# Patient Record
Sex: Female | Born: 1955
Health system: Southern US, Community
[De-identification: ages and names within clinical notes are randomized; demographics above are authoritative.]

## PROBLEM LIST (undated history)

## (undated) DIAGNOSIS — I10 Essential (primary) hypertension: Secondary | ICD-10-CM

## (undated) DIAGNOSIS — F419 Anxiety disorder, unspecified: Secondary | ICD-10-CM

## (undated) DIAGNOSIS — R51 Headache: Secondary | ICD-10-CM

## (undated) DIAGNOSIS — K529 Noninfective gastroenteritis and colitis, unspecified: Secondary | ICD-10-CM

## (undated) DIAGNOSIS — Z860100 Personal history of colon polyps, unspecified: Secondary | ICD-10-CM

## (undated) DIAGNOSIS — K219 Gastro-esophageal reflux disease without esophagitis: Secondary | ICD-10-CM

## (undated) DIAGNOSIS — Z9889 Other specified postprocedural states: Secondary | ICD-10-CM

## (undated) DIAGNOSIS — K5790 Diverticulosis of intestine, part unspecified, without perforation or abscess without bleeding: Secondary | ICD-10-CM

## (undated) DIAGNOSIS — K5190 Ulcerative colitis, unspecified, without complications: Secondary | ICD-10-CM

## (undated) DIAGNOSIS — R112 Nausea with vomiting, unspecified: Secondary | ICD-10-CM

## (undated) DIAGNOSIS — T7840XA Allergy, unspecified, initial encounter: Secondary | ICD-10-CM

## (undated) DIAGNOSIS — K589 Irritable bowel syndrome without diarrhea: Secondary | ICD-10-CM

## (undated) HISTORY — PX: BREAST BIOPSY: SHX20

## (undated) HISTORY — DX: Personal history of colonic polyps: Z86.010

## (undated) HISTORY — DX: Diverticulosis of intestine, part unspecified, without perforation or abscess without bleeding: K57.90

## (undated) HISTORY — DX: Gastro-esophageal reflux disease without esophagitis: K21.9

## (undated) HISTORY — DX: Personal history of colon polyps, unspecified: Z86.0100

## (undated) HISTORY — DX: Ulcerative colitis, unspecified, without complications: K51.90

## (undated) HISTORY — DX: Irritable bowel syndrome, unspecified: K58.9

## (undated) HISTORY — DX: Allergy, unspecified, initial encounter: T78.40XA

## (undated) HISTORY — DX: Essential (primary) hypertension: I10

## (undated) HISTORY — PX: ANKLE SURGERY: SHX546

## (undated) HISTORY — DX: Anxiety disorder, unspecified: F41.9

## (undated) HISTORY — PX: NASAL SINUS SURGERY: SHX719

## (undated) HISTORY — PX: COLONOSCOPY: SHX174

## (undated) HISTORY — PX: BREAST CYST ASPIRATION: SHX578

---

## 1997-05-04 ENCOUNTER — Other Ambulatory Visit: Admission: RE | Admit: 1997-05-04 | Discharge: 1997-05-04 | Payer: Self-pay | Admitting: Obstetrics & Gynecology

## 2000-06-13 ENCOUNTER — Other Ambulatory Visit: Admission: RE | Admit: 2000-06-13 | Discharge: 2000-06-13 | Payer: Self-pay | Admitting: Obstetrics and Gynecology

## 2000-06-14 ENCOUNTER — Ambulatory Visit (HOSPITAL_COMMUNITY): Admission: RE | Admit: 2000-06-14 | Discharge: 2000-06-14 | Payer: Self-pay | Admitting: Obstetrics and Gynecology

## 2000-06-14 ENCOUNTER — Encounter: Payer: Self-pay | Admitting: Obstetrics and Gynecology

## 2001-06-19 ENCOUNTER — Other Ambulatory Visit: Admission: RE | Admit: 2001-06-19 | Discharge: 2001-06-19 | Payer: Self-pay | Admitting: Obstetrics and Gynecology

## 2001-06-23 ENCOUNTER — Encounter: Payer: Self-pay | Admitting: Obstetrics and Gynecology

## 2001-06-23 ENCOUNTER — Ambulatory Visit (HOSPITAL_COMMUNITY): Admission: RE | Admit: 2001-06-23 | Discharge: 2001-06-23 | Payer: Self-pay | Admitting: Obstetrics and Gynecology

## 2001-08-04 ENCOUNTER — Ambulatory Visit (HOSPITAL_COMMUNITY): Admission: RE | Admit: 2001-08-04 | Discharge: 2001-08-04 | Payer: Self-pay | Admitting: Obstetrics and Gynecology

## 2001-08-04 ENCOUNTER — Encounter: Payer: Self-pay | Admitting: Obstetrics and Gynecology

## 2002-09-07 ENCOUNTER — Other Ambulatory Visit: Admission: RE | Admit: 2002-09-07 | Discharge: 2002-09-07 | Payer: Self-pay | Admitting: Obstetrics and Gynecology

## 2003-11-19 ENCOUNTER — Ambulatory Visit: Payer: Self-pay | Admitting: Family Medicine

## 2004-02-25 ENCOUNTER — Other Ambulatory Visit: Admission: RE | Admit: 2004-02-25 | Discharge: 2004-02-25 | Payer: Self-pay | Admitting: Obstetrics and Gynecology

## 2005-01-11 ENCOUNTER — Ambulatory Visit: Payer: Self-pay | Admitting: Family Medicine

## 2005-04-05 ENCOUNTER — Other Ambulatory Visit: Admission: RE | Admit: 2005-04-05 | Discharge: 2005-04-05 | Payer: Self-pay | Admitting: Obstetrics and Gynecology

## 2005-04-20 ENCOUNTER — Encounter: Admission: RE | Admit: 2005-04-20 | Discharge: 2005-04-20 | Payer: Self-pay | Admitting: Obstetrics and Gynecology

## 2006-01-28 ENCOUNTER — Encounter: Admission: RE | Admit: 2006-01-28 | Discharge: 2006-01-28 | Payer: Self-pay | Admitting: Obstetrics and Gynecology

## 2006-08-21 DIAGNOSIS — R519 Headache, unspecified: Secondary | ICD-10-CM | POA: Insufficient documentation

## 2006-08-21 DIAGNOSIS — R51 Headache: Secondary | ICD-10-CM | POA: Insufficient documentation

## 2007-06-11 ENCOUNTER — Encounter: Admission: RE | Admit: 2007-06-11 | Discharge: 2007-06-11 | Payer: Self-pay | Admitting: Specialist

## 2008-09-30 ENCOUNTER — Encounter: Admission: RE | Admit: 2008-09-30 | Discharge: 2008-09-30 | Payer: Self-pay | Admitting: Specialist

## 2010-01-18 ENCOUNTER — Encounter
Admission: RE | Admit: 2010-01-18 | Discharge: 2010-01-18 | Payer: Self-pay | Source: Home / Self Care | Attending: Specialist | Admitting: Specialist

## 2010-01-29 ENCOUNTER — Encounter: Payer: Self-pay | Admitting: Obstetrics and Gynecology

## 2010-01-29 ENCOUNTER — Encounter: Payer: Self-pay | Admitting: Specialist

## 2010-01-30 ENCOUNTER — Encounter: Payer: Self-pay | Admitting: Specialist

## 2010-04-11 ENCOUNTER — Inpatient Hospital Stay (INDEPENDENT_AMBULATORY_CARE_PROVIDER_SITE_OTHER)
Admission: RE | Admit: 2010-04-11 | Discharge: 2010-04-11 | Disposition: A | Discharge status: Home / Self Care | Payer: Federal, State, Local not specified - PPO | Source: Ambulatory Visit | Attending: Emergency Medicine | Admitting: Emergency Medicine

## 2010-04-11 ENCOUNTER — Encounter: Payer: Self-pay | Admitting: Emergency Medicine

## 2010-04-11 DIAGNOSIS — J029 Acute pharyngitis, unspecified: Secondary | ICD-10-CM | POA: Insufficient documentation

## 2010-04-11 DIAGNOSIS — J039 Acute tonsillitis, unspecified: Secondary | ICD-10-CM

## 2010-04-11 LAB — CONVERTED CEMR LAB: Rapid Strep: NEGATIVE

## 2010-04-19 ENCOUNTER — Telehealth (INDEPENDENT_AMBULATORY_CARE_PROVIDER_SITE_OTHER): Payer: Self-pay | Admitting: *Deleted

## 2010-05-07 ENCOUNTER — Encounter: Payer: Self-pay | Admitting: Family Medicine

## 2010-05-07 ENCOUNTER — Inpatient Hospital Stay (INDEPENDENT_AMBULATORY_CARE_PROVIDER_SITE_OTHER)
Admission: RE | Admit: 2010-05-07 | Discharge: 2010-05-07 | Disposition: A | Discharge status: Home / Self Care | Payer: Federal, State, Local not specified - PPO | Source: Ambulatory Visit | Attending: Family Medicine | Admitting: Family Medicine

## 2010-05-07 ENCOUNTER — Ambulatory Visit
Admission: RE | Admit: 2010-05-07 | Discharge: 2010-05-07 | Disposition: A | Discharge status: Home / Self Care | Payer: Federal, State, Local not specified - PPO | Source: Ambulatory Visit | Attending: Family Medicine | Admitting: Family Medicine

## 2010-05-07 ENCOUNTER — Other Ambulatory Visit: Payer: Self-pay | Admitting: Family Medicine

## 2010-05-07 DIAGNOSIS — M25539 Pain in unspecified wrist: Secondary | ICD-10-CM

## 2010-05-07 DIAGNOSIS — S6990XA Unspecified injury of unspecified wrist, hand and finger(s), initial encounter: Secondary | ICD-10-CM

## 2010-05-07 DIAGNOSIS — M79609 Pain in unspecified limb: Secondary | ICD-10-CM

## 2010-05-07 DIAGNOSIS — F325 Major depressive disorder, single episode, in full remission: Secondary | ICD-10-CM | POA: Insufficient documentation

## 2010-05-07 DIAGNOSIS — S59919A Unspecified injury of unspecified forearm, initial encounter: Secondary | ICD-10-CM

## 2010-05-07 DIAGNOSIS — S59909A Unspecified injury of unspecified elbow, initial encounter: Secondary | ICD-10-CM

## 2010-05-07 DIAGNOSIS — S63509A Unspecified sprain of unspecified wrist, initial encounter: Secondary | ICD-10-CM

## 2010-05-14 ENCOUNTER — Telehealth (INDEPENDENT_AMBULATORY_CARE_PROVIDER_SITE_OTHER): Payer: Self-pay

## 2010-07-26 ENCOUNTER — Encounter: Payer: Self-pay | Admitting: Emergency Medicine

## 2010-07-26 ENCOUNTER — Inpatient Hospital Stay (INDEPENDENT_AMBULATORY_CARE_PROVIDER_SITE_OTHER)
Admission: RE | Admit: 2010-07-26 | Discharge: 2010-07-26 | Disposition: A | Discharge status: Home / Self Care | Payer: Federal, State, Local not specified - PPO | Source: Ambulatory Visit | Attending: Emergency Medicine | Admitting: Emergency Medicine

## 2010-07-26 DIAGNOSIS — J029 Acute pharyngitis, unspecified: Secondary | ICD-10-CM

## 2010-07-29 ENCOUNTER — Telehealth (INDEPENDENT_AMBULATORY_CARE_PROVIDER_SITE_OTHER): Payer: Self-pay | Admitting: Emergency Medicine

## 2010-12-11 NOTE — Telephone Encounter (Signed)
  Phone Note Outgoing Call   Call placed by: Mallie Snooks RN,  July 29, 2010 5:06 PM Call placed to: Patient Action Taken: Phone Call Completed Summary of Call: Left message on voice mail stating culture negative; no need to start ABX, but if already started, complete the course. Initial call taken by: Mallie Snooks RN,  July 29, 2010 5:07 PM

## 2010-12-11 NOTE — Telephone Encounter (Signed)
  Phone Note Outgoing Call   Call placed by: Earley Favor RN,  May 14, 2010 2:49 PM Call placed to: Patient Summary of Call: pt states she is doing great, instructed to come by or call facility with questions/concerns

## 2010-12-11 NOTE — Progress Notes (Signed)
Summary: SORE THROAT/TJ rm 5   Vital Signs:  Patient Profile:   55 Years Old Female CC:      sore throat Height:     62 inches Weight:      128.50 pounds O2 Sat:      98 % O2 treatment:    Room Air Temp:     99.5 degrees F oral Pulse rate:   107 / minute Resp:     18 per minute BP sitting:   129 / 89  (left arm) Cuff size:   regular  Vitals Entered By: Charna Archer LPN (April 11, 9935 1:69 PM)                  Updated Prior Medication List: ASPIRIN/ANTACID 325 MG  TBEC (ASPIRIN)  BLACK COHOSH 40 MG  CAPS (BLACK COHOSH)  DAILY HERBS RELAX/EASE TENSION   TABS (MISC NATURAL PRODUCTS)  IMITREX 25 MG  TABS (SUMATRIPTAN SUCCINATE)  VICODIN ES 7.5-750 MG  TABS (HYDROCODONE-ACETAMINOPHEN)  PHENERGAN 25 MG/ML INJ SOLN (PROMETHAZINE HCL)  REGLAN 10 MG  TABS (METOCLOPRAMIDE HCL)  WELLBUTRIN XL 300 MG XR24H-TAB (BUPROPION HCL)   Current Allergies: No known allergies History of Present Illness History from: patient Chief Complaint: sore throat History of Present Illness: This 55 Years Old White Female comes in today complaining of a severe sore throat. The patient denies drooling, trouble swallowing, or trouble breathing. There is not a history of recent exposure to strep. The patient does not give a history of cold or URI symptoms.  Pt complains of  2 days of congestion sore throat. No cough, No dyspnea. No chest pain. No wheezing.  Mild nausea No vomiting. + fever, + chills , + feels achy with mild headache. Last strep tonsillittis was 7 yrs ago. NKDA. She requests rx diflucan if abx rx'd,   REVIEW OF SYSTEMS Constitutional Symptoms      Denies fever, chills, night sweats, weight loss, weight gain, and fatigue.  Eyes       Denies change in vision, eye pain, eye discharge, glasses, contact lenses, and eye surgery. Ear/Nose/Throat/Mouth       Complains of sore throat and hoarseness.      Denies hearing loss/aids, change in hearing, ear pain, ear discharge, dizziness,  frequent runny nose, frequent nose bleeds, sinus problems, and tooth pain or bleeding.  Respiratory       Denies dry cough, productive cough, wheezing, shortness of breath, asthma, bronchitis, and emphysema/COPD.  Cardiovascular       Denies murmurs, chest pain, and tires easily with exhertion.    Gastrointestinal       Denies stomach pain, nausea/vomiting, diarrhea, constipation, blood in bowel movements, and indigestion. Genitourniary       Denies painful urination, kidney stones, and loss of urinary control. Neurological       Complains of headaches.      Denies paralysis, seizures, and fainting/blackouts. Musculoskeletal       Denies muscle pain, joint pain, joint stiffness, decreased range of motion, redness, swelling, muscle weakness, and gout.  Skin       Denies bruising, unusual mles/lumps or sores, and hair/skin or nail changes.  Psych       Denies mood changes, temper/anger issues, anxiety/stress, speech problems, depression, and sleep problems. Other Comments: pt c/o fever, sore throat, HA, achy all over, jaw pain, and sinus congestion x 2 days. she took 4 IBF @ 4:45pm today and 1/2 of a vicodin @ 6:30pm.   Past History:  Past Medical History: Reviewed history from 08/21/2006 and no changes required. Disc Problems Tobacco Abuse Sinusitis Headache  Past Surgical History: Reviewed history from 08/21/2006 and no changes required. Caesarean section Ankle Surgery Lapsx3 - Cysts ovaries Fibroids x2  Family History: Reviewed history from 08/21/2006 and no changes required. Family History Diabetes 1st degree relative  Social History: Occupation: Married Current Smoker- quit Alcohol use-yes Physical Exam General appearance: well developed, well nourished,very fatigued, no acute distress Head: normocephalic, atraumatic Eyes: conjunctivae and lids normal. No icterus Ears: normal, no lesions or deformities Nasal: mucosa pink, nonedematous, no septal deviation,  turbinates normal Oral/Pharynx: bilateral tonsillar erythema and enlargement, with whitish-yellow exudate, Left>Right. uvula midline without deviation. No fluctuence. Neck: supple,tender anterior lymphadenopathy present Chest/Lungs: no rales, wheezes, or rhonchi bilateral, breath sounds equal without effort Heart: regular rate and  rhythm, no murmur Abdomen: soft, non-tender without obvious organomegaly Extremities: no cce Skin: no obvious rashes or lesions. Skin warm and moist. Assessment New Problems: TONSILLITIS, EXUDATIVE, ACUTE (ICD-463) SORE THROAT (ICD-462)  Discussed tx options. Risks, benefits, alternatives discussed. Pt voiced understanding and agreement. -Rocephin 500 mg IM stat. Then, start by mouth rx for augmentin. We are tx'ing aggressively to help avoid a peritonsillar abcess.--Also, send off throat cx. Pt agrees.  Patient Education: Patient and/or caregiver instructed in the following: rest, fluids, Tylenol prn.  Plan New Medications/Changes: FLUCONAZOLE 200 MG TABS (FLUCONAZOLE) 1 by mouth stat as needed yeast infxn  #1 x 0, 04/11/2010, Jacqulyn Cane MD AUGMENTIN [phone] MG TABS (AMOXICILLIN-POT CLAVULANATE) 1 by mouth two times a day pc for 10 days  #20 x 0, 04/11/2010, Jacqulyn Cane MD  New Orders: Rapid Strep [65784] T-Culture, Throat [[id number]] Rocephin  224m [J0696] Rocephin  2574m[J0696] New Patient Level III [99203] Services provided After hours-Weekends-Holidays [99051] Admin of Therapeutic Inj  intramuscular or subcutaneous [96372] Follow Up: Follow up in 2-3 days if no improvement Follow Up: sooner prn  The patient and/or caregiver has been counseled thoroughly with regard to medications prescribed including dosage, schedule, interactions, rationale for use, and possible side effects and they verbalize understanding.  Diagnoses and expected course of recovery discussed and will return if not improved as expected or if the condition worsens. Patient  and/or caregiver verbalized understanding.  Prescriptions: FLUCONAZOLE 200 MG TABS (FLUCONAZOLE) 1 by mouth stat as needed yeast infxn  #1 x 0   Entered and Authorized by:   DaJacqulyn CaneD   Signed by:   DaJacqulyn CaneD on 04/11/2010   Method used:   Handwritten   RxID: :   [id number] 875-125 MG TABS (AMOXICILLIN-POT CLAVULANATE) 1 by mouth two times a day pc for 10 days  #20 x 0   Entered and Authorized by:   DaJacqulyn CaneD   Signed by:   DaJacqulyn CaneD on 04/11/2010   Method used:   Handwritten   RxID: :   [id number] Medication Administration  Injection # 1:    Medication: Rocephin  25029m  Diagnosis: TONSILLITIS, EXUDATIVE, ACUTE (ICD-463)    Route: IM    Site: LUOQ gluteus    Exp Date: 03/09/2011    Lot #: [id number] Mfr: sandoz    Comments: 500 mg given    Patient tolerated injection without complications    Given by: ChrCharna ArcherN (April  3, [id number])  Orders Added: 1)  Rapid Strep [87[63016]  T-Culture, Throat [87[[id number]]  Rocephin  250m20m0696] 4)  Rocephin  250mg48m69[D2202]  5)  New Patient Level III [99203] 6)  Services provided After hours-Weekends-Holidays [99051] 7)  Admin of Therapeutic Inj  intramuscular or subcutaneous [96372]    Laboratory Results  Date/Time Received: April 11, 2010 7:31 PM  Date/Time Reported: April 11, 2010 7:31 PM   Other Tests  Rapid Strep: negative  Kit Test Internal QC: Negative   (Normal Range: Negative)

## 2010-12-11 NOTE — Progress Notes (Signed)
Summary: finger injury/TM (rm 5)   Vital Signs:  Patient Profile:   55 Years Old Female CC:      left hand pain post fall two days ago Height:     62 inches Weight:      128 pounds O2 Sat:      100 % O2 treatment:    Room Air Temp:     98.9 degrees F oral Pulse rate:   95 / minute Resp:     14 per minute BP sitting:   128 / 83  (left arm) Cuff size:   regular  Pt. in pain?   yes    Location:   left hand    Intensity:   6    Type:       sharp  Vitals Entered By: Betti Cruz RN (May 07, 2010 12:09 PM)                   Updated Prior Medication List: ASPIRIN/ANTACID 325 MG  TBEC (ASPIRIN)  BLACK COHOSH 40 MG  CAPS (BLACK COHOSH)  DAILY HERBS RELAX/EASE TENSION   TABS (MISC NATURAL PRODUCTS)  IMITREX 25 MG  TABS (SUMATRIPTAN SUCCINATE)  VICODIN ES 7.5-750 MG  TABS (HYDROCODONE-ACETAMINOPHEN)  PHENERGAN 25 MG/ML INJ SOLN (PROMETHAZINE HCL)  REGLAN 10 MG  TABS (METOCLOPRAMIDE HCL)  WELLBUTRIN XL 300 MG XR24H-TAB (BUPROPION HCL)   Current Allergies: No known allergies History of Present Illness Chief Complaint: left hand pain post fall two days ago History of Present Illness:  Subjective:  Patient fell two days ago, landing on her right hand, hyperextending the wrist.  She felt a popping sensation and has had persistent pain in hand, wrist, and forearm.  Initial swelling decreased with ice and ibuprofen  REVIEW OF SYSTEMS Constitutional Symptoms      Denies fever, chills, night sweats, weight loss, weight gain, and fatigue.  Eyes       Denies change in vision, eye pain, eye discharge, glasses, contact lenses, and eye surgery. Ear/Nose/Throat/Mouth       Denies hearing loss/aids, change in hearing, ear pain, ear discharge, dizziness, frequent runny nose, frequent nose bleeds, sinus problems, sore throat, hoarseness, and tooth pain or bleeding.  Respiratory       Denies dry cough, productive cough, wheezing, shortness of breath, asthma, bronchitis, and  emphysema/COPD.  Cardiovascular       Denies murmurs, chest pain, and tires easily with exhertion.    Gastrointestinal       Denies stomach pain, nausea/vomiting, diarrhea, constipation, blood in bowel movements, and indigestion. Genitourniary       Denies painful urination, kidney stones, and loss of urinary control. Neurological       Denies paralysis, seizures, and fainting/blackouts. Musculoskeletal       Complains of joint pain, joint stiffness, decreased range of motion, and swelling.      Denies muscle pain, redness, muscle weakness, and gout.      Comments: left hand Skin       Denies bruising, unusual mles/lumps or sores, and hair/skin or nail changes.  Psych       Denies mood changes, temper/anger issues, anxiety/stress, speech problems, depression, and sleep problems. Other Comments: Patient fell 2 days ago catching herself with her right hand. She has used ice and ibuprofen.    Past History:  Past Medical History: Disc Problems Sinusitis Migraines Depression  Past Surgical History: Reviewed history from 08/21/2006 and no changes required. Caesarean section Ankle Surgery Lapsx3 - Cysts ovaries Fibroids  x2  Family History: Reviewed history from 08/21/2006 and no changes required. Family History Diabetes 1st degree relative  Social History: Married Current Smoker Alcohol use-yes Regular exercise-yes Does Patient Exercise:  yes   Objective:  Appearance:  Patient appears healthy, stated age, and in no acute distress  Right elbow:  Full range of motion without tenderness Right forearm:  No swelling or deformity.  Mild diffuse tenderness dorsally and laterally Right wrist:  No swelling or deformity.  Decreased range of motion.  Tenderness dorsum.   Right hand:  No swelling or deformity.  Decreased range of motion fingers.  Diffuse tenderness dorsum.  Distal neurovascular intact  Right forearm X-ray:  negative  Right wrist X-ray:  negative Right hand X-ray:   negative Assessment New Problems: WRIST SPRAIN, RIGHT (ICD-842.00) PAIN IN JOINT, FOREARM (ICD-719.43) WRIST INJURY, RIGHT (ICD-959.3) HAND PAIN, RIGHT (ICD-729.5) DEPRESSION (ICD-311)  SUSPECT STRAINS IN FOREARM AND HAND AS WELL AS WRIST  Plan New Medications/Changes: LORTAB 5 5-500 MG TABS (HYDROCODONE-ACETAMINOPHEN) One or two tabs by mouth hs as needed pain  #10 (ten) x 0, 05/07/2010, Theone Murdoch MD NAPROXEN 500 MG TABS (NAPROXEN) One by mouth two times a day pc  #20 x 1, 05/07/2010, Theone Murdoch MD  New Orders: T-DG Forearm*R* [48250] T-DG Wrist Complete*R* [73110] T-DG Hand Complete*R* [73130] Wrist/Thumb Spica [L3923] Services provided After hours-Weekends-Holidays [99051] Est. Patient Level IV [03704] Planning Comments:   Applied thumb spica velcro splint; wear for 5 to 7 days.  Continue ice packs.  Begin Naproxen.  Rx for Lortab at bedtime. Begin stretching and range of motion exercises in 4 to 5 days ([facility] information and instruction patient handout given)  Followup with [facility] if not improved in two weeks.   The patient and/or caregiver has been counseled thoroughly with regard to medications prescribed including dosage, schedule, interactions, rationale for use, and possible side effects and they verbalize understanding.  Diagnoses and expected course of recovery discussed and will return if not improved as expected or if the condition worsens. Patient and/or caregiver verbalized understanding.  Prescriptions: LORTAB 5 5-500 MG TABS (HYDROCODONE-ACETAMINOPHEN) One or two tabs by mouth hs as needed pain  #10 (ten) x 0   Entered and Authorized by:   Theone Murdoch MD   Signed by:   Theone Murdoch MD on 05/07/2010   Method used:   Print then Give to Patient   RxID:   [id number] NAPROXEN 500 MG TABS (NAPROXEN) One by mouth two times a day pc  #20 x 1   Entered and Authorized by:   Theone Murdoch MD   Signed by:   Theone Murdoch MD on  05/07/2010   Method used:   Print then Give to Patient   RxID:   [id number]   Orders Added: 1)  T-DG Forearm*R* [73090] 2)  T-DG Wrist Complete*R* [73110] 3)  T-DG Hand Complete*R* [48016] 4)  Wrist/Thumb Spica [P5374] 5)  Services provided After hours-Weekends-Holidays [99051] 6)  Est. Patient Level IV [82707]

## 2010-12-11 NOTE — Progress Notes (Signed)
Summary: SORE THROAT AND LEFT EAR PAIN   Vital Signs:  Patient Profile:   55 Years Old Female CC:      sore throat and left ear pain Height:     62 inches Weight:      130 pounds O2 Sat:      99 % O2 treatment:    Room Air Temp:     98.6 degrees F oral Pulse rate:   87 / minute Resp:     14 per minute BP sitting:   138 / 82  (left arm) Cuff size:   regular  Vitals Entered By: Betti Cruz RN (July 26, 2010 12:02 PM)                  Updated Prior Medication List: ASPIRIN/ANTACID 325 MG  TBEC (ASPIRIN)  BLACK COHOSH 40 MG  CAPS (BLACK COHOSH)  DAILY HERBS RELAX/EASE TENSION   TABS (MISC NATURAL PRODUCTS)  IMITREX 25 MG  TABS (SUMATRIPTAN SUCCINATE)  REGLAN 10 MG  TABS (METOCLOPRAMIDE HCL)  WELLBUTRIN XL 300 MG XR24H-TAB (BUPROPION HCL)   Current Allergies: No known allergies History of Present Illness History from: patient Chief Complaint: sore throat and left ear pain History of Present Illness: 55 Years Old Female complains of onset of cold symptoms for 2 days.  Mia has been using Allegra which is helping a little bit.  She is a strep carrier. +sore throat No cough No pleuritic pain No wheezing No nasal congestion No post-nasal drainage No sinus pain/pressure No chest congestion No itchy/red eyes + L earache No hemoptysis No SOB No chills/sweats No fever No nausea No vomiting No abdominal pain No diarrhea No skin rashes No fatigue No myalgias No headache   REVIEW OF SYSTEMS Constitutional Symptoms      Denies fever, chills, night sweats, weight loss, weight gain, and fatigue.  Eyes       Denies change in vision, eye pain, eye discharge, glasses, contact lenses, and eye surgery. Ear/Nose/Throat/Mouth       Complains of ear pain and sore throat.      Denies hearing loss/aids, change in hearing, ear discharge, dizziness, frequent runny nose, frequent nose bleeds, sinus problems, hoarseness, and tooth pain or bleeding.  Respiratory       Denies  dry cough, productive cough, wheezing, shortness of breath, asthma, bronchitis, and emphysema/COPD.  Cardiovascular       Denies murmurs, chest pain, and tires easily with exhertion.    Gastrointestinal       Denies stomach pain, nausea/vomiting, diarrhea, constipation, blood in bowel movements, and indigestion. Genitourniary       Denies painful urination, kidney stones, and loss of urinary control. Neurological       Denies paralysis, seizures, and fainting/blackouts. Musculoskeletal       Denies muscle pain, joint pain, joint stiffness, decreased range of motion, redness, swelling, muscle weakness, and gout.  Skin       Denies bruising, unusual mles/lumps or sores, and hair/skin or nail changes.  Psych       Denies mood changes, temper/anger issues, anxiety/stress, speech problems, depression, and sleep problems.  Past History:  Past Medical History: Reviewed history from 05/07/2010 and no changes required. Disc Problems Sinusitis Migraines Depression  Past Surgical History: Reviewed history from 08/21/2006 and no changes required. Caesarean section Ankle Surgery Lapsx3 - Cysts ovaries Fibroids x2  Family History: Reviewed history from 08/21/2006 and no changes required. Family History Diabetes 1st degree relative  Social History: Reviewed history from  05/07/2010 and no changes required. Married Current Smoker Alcohol use-yes Regular exercise-yes Physical Exam General appearance: well developed, well nourished, no acute distress Ears: normal, no lesions or deformities Nasal: mucosa pink, nonedematous, no septal deviation, turbinates normal Oral/Pharynx: minimal erythema L, mildly tender ant cerv LAD L Chest/Lungs: no rales, wheezes, or rhonchi bilateral, breath sounds equal without effort Heart: regular rate and  rhythm, no murmur MSE: oriented to time, place, and person  Plan New Medications/Changes: AMOXICILLIN 875 MG TABS (AMOXICILLIN) 1 by mouth two times  a day for 8 days  #16 x 0, 07/26/2010, Fidela Salisbury MD  New Orders: Est. Patient Level IV [21747] Rapid Strep [15953] T-Culture, Throat [[id number]] Planning Comments:   1)  Take the prescribed antibiotic as instructed.  Hold for a few days.  Currently is very mild and rapid strep is negative but she has had bad tonsillitis in the past.  If worsening soreness, swelling, exudates, can start the meds.  Throat culture pending. 2)  Use nasal saline solution (over the counter) at least 3 times a day. 3)  Use over the counter decongestants like Zyrtec-D every 12 hours as needed to help with congestion. 4)  Can take tylenol every 6 hours or motrin every 8 hours for pain or fever. 5)  Follow up with your primary doctor  if no improvement in 5-7 days, sooner if increasing pain, fever, or new symptoms.    The patient and/or caregiver has been counseled thoroughly with regard to medications prescribed including dosage, schedule, interactions, rationale for use, and possible side effects and they verbalize understanding.  Diagnoses and expected course of recovery discussed and will return if not improved as expected or if the condition worsens. Patient and/or caregiver verbalized understanding.  Prescriptions: AMOXICILLIN 875 MG TABS (AMOXICILLIN) 1 by mouth two times a day for 8 days  #16 x 0   Entered and Authorized by:   Fidela Salisbury MD   Signed by:   Fidela Salisbury MD on 07/26/2010   Method used:   Print then Give to Patient   RxID:   [id number]   Orders Added: 1)  Est. Patient Level IV [86484] 2)  Rapid Strep [72072] 3)  T-Culture, Throat [[id number]]    Laboratory Results  Date/Time Received: July 26, 2010 12:02 PM  Date/Time Reported: July 26, 2010 12:02 PM   Other Tests  Rapid Strep: negative  Kit Test Internal QC: Negative   (Normal Range: Negative)

## 2010-12-11 NOTE — Telephone Encounter (Signed)
  Phone Note Outgoing Call Call back at [facility] Phone [phone]   Call placed by: Betti Cruz RN,  April 19, 2010 3:14 PM Call placed to: Patient Summary of Call: Callback: No answer. Message left to call back for lab results 04/19/10 4:35pm- pt given throat culture results. she states that she started feeling better on Sunday. advised her to call back if she has any questions or concerns. Initial call taken by: Charna Archer LPN,  April 18, 368 4:37 PM

## 2011-03-12 ENCOUNTER — Other Ambulatory Visit: Payer: Self-pay | Admitting: Specialist

## 2011-03-12 DIAGNOSIS — Z1231 Encounter for screening mammogram for malignant neoplasm of breast: Secondary | ICD-10-CM

## 2011-03-19 ENCOUNTER — Ambulatory Visit: Payer: Federal, State, Local not specified - PPO

## 2011-03-27 LAB — HM PAP SMEAR: HM Pap smear: NEGATIVE

## 2012-05-06 ENCOUNTER — Ambulatory Visit
Admission: RE | Admit: 2012-05-06 | Discharge: 2012-05-06 | Disposition: A | Discharge status: Home / Self Care | Payer: Federal, State, Local not specified - PPO | Source: Ambulatory Visit | Attending: Specialist | Admitting: Specialist

## 2012-05-06 DIAGNOSIS — Z1231 Encounter for screening mammogram for malignant neoplasm of breast: Secondary | ICD-10-CM

## 2012-08-01 LAB — BASIC METABOLIC PANEL WITH GFR
BUN: 12 mg/dL (ref 4–21)
Creatinine: 0.7 mg/dL (ref 0.5–1.1)
Glucose: 72 mg/dL
Potassium: 4.3 mmol/L (ref 3.4–5.3)
Sodium: 138 mmol/L (ref 137–147)

## 2012-08-01 LAB — HEPATIC FUNCTION PANEL: ALK PHOS: 78 U/L (ref 25–125)

## 2012-08-01 LAB — CBC AND DIFFERENTIAL
Hemoglobin: 15.6 g/dL (ref 12.0–16.0)
Platelets: 340 K/µL (ref 150–399)
WBC: 10.5 10*3/mL

## 2012-08-01 LAB — LIPID PANEL
HDL: 58 mg/dL (ref 35–70)
LDL Cholesterol: 103 mg/dL
LDL/HDL RATIO: 3.2
TRIGLYCERIDES: 134 mg/dL (ref 40–160)

## 2012-08-01 LAB — HEMOGLOBIN A1C: Hemoglobin A1C: 5.4

## 2012-08-01 LAB — VITAMIN D 25 HYDROXY (VIT D DEFICIENCY, FRACTURES): VIT D 25 HYDROXY: 43

## 2012-08-01 LAB — TSH: TSH: 0.5 u[IU]/mL (ref 0.41–5.90)

## 2012-09-19 ENCOUNTER — Encounter: Payer: Self-pay | Admitting: Gastroenterology

## 2012-09-19 LAB — HM COLONOSCOPY

## 2012-10-13 DIAGNOSIS — J309 Allergic rhinitis, unspecified: Secondary | ICD-10-CM | POA: Insufficient documentation

## 2012-10-13 DIAGNOSIS — I10 Essential (primary) hypertension: Secondary | ICD-10-CM

## 2012-10-13 HISTORY — DX: Essential (primary) hypertension: I10

## 2013-06-16 ENCOUNTER — Other Ambulatory Visit: Payer: Self-pay

## 2013-06-16 DIAGNOSIS — Z1231 Encounter for screening mammogram for malignant neoplasm of breast: Secondary | ICD-10-CM

## 2013-06-25 ENCOUNTER — Ambulatory Visit
Admission: RE | Admit: 2013-06-25 | Discharge: 2013-06-25 | Disposition: A | Discharge status: Home / Self Care | Payer: Federal, State, Local not specified - PPO | Source: Ambulatory Visit

## 2013-06-25 ENCOUNTER — Encounter (INDEPENDENT_AMBULATORY_CARE_PROVIDER_SITE_OTHER): Payer: Self-pay

## 2013-06-25 DIAGNOSIS — Z1231 Encounter for screening mammogram for malignant neoplasm of breast: Secondary | ICD-10-CM

## 2013-07-03 ENCOUNTER — Emergency Department (HOSPITAL_BASED_OUTPATIENT_CLINIC_OR_DEPARTMENT_OTHER): Payer: Federal, State, Local not specified - PPO

## 2013-07-03 ENCOUNTER — Inpatient Hospital Stay (HOSPITAL_COMMUNITY): Payer: Federal, State, Local not specified - PPO

## 2013-07-03 ENCOUNTER — Inpatient Hospital Stay (HOSPITAL_BASED_OUTPATIENT_CLINIC_OR_DEPARTMENT_OTHER)
Admission: EM | Admit: 2013-07-03 | Discharge: 2013-07-05 | DRG: 312 | Disposition: A | Discharge status: Home / Self Care | Payer: Federal, State, Local not specified - PPO | Attending: Internal Medicine | Admitting: Internal Medicine

## 2013-07-03 ENCOUNTER — Encounter (HOSPITAL_BASED_OUTPATIENT_CLINIC_OR_DEPARTMENT_OTHER): Payer: Self-pay | Admitting: Emergency Medicine

## 2013-07-03 DIAGNOSIS — R51 Headache: Secondary | ICD-10-CM

## 2013-07-03 DIAGNOSIS — R55 Syncope and collapse: Principal | ICD-10-CM | POA: Diagnosis present

## 2013-07-03 DIAGNOSIS — R404 Transient alteration of awareness: Secondary | ICD-10-CM | POA: Diagnosis present

## 2013-07-03 DIAGNOSIS — F329 Major depressive disorder, single episode, unspecified: Secondary | ICD-10-CM

## 2013-07-03 DIAGNOSIS — Y92410 Unspecified street and highway as the place of occurrence of the external cause: Secondary | ICD-10-CM

## 2013-07-03 DIAGNOSIS — F3289 Other specified depressive episodes: Secondary | ICD-10-CM | POA: Diagnosis present

## 2013-07-03 DIAGNOSIS — F17200 Nicotine dependence, unspecified, uncomplicated: Secondary | ICD-10-CM | POA: Diagnosis present

## 2013-07-03 DIAGNOSIS — I10 Essential (primary) hypertension: Secondary | ICD-10-CM | POA: Diagnosis present

## 2013-07-03 DIAGNOSIS — F1210 Cannabis abuse, uncomplicated: Secondary | ICD-10-CM | POA: Diagnosis present

## 2013-07-03 DIAGNOSIS — F325 Major depressive disorder, single episode, in full remission: Secondary | ICD-10-CM | POA: Diagnosis present

## 2013-07-03 HISTORY — DX: Noninfective gastroenteritis and colitis, unspecified: K52.9

## 2013-07-03 HISTORY — DX: Headache: R51

## 2013-07-03 HISTORY — DX: Essential (primary) hypertension: I10

## 2013-07-03 LAB — COMPREHENSIVE METABOLIC PANEL
ALBUMIN: 3.8 g/dL (ref 3.5–5.2)
ALT: 32 U/L (ref 0–35)
AST: 26 U/L (ref 0–37)
Alkaline Phosphatase: 76 U/L (ref 39–117)
BUN: 10 mg/dL (ref 6–23)
CALCIUM: 9.6 mg/dL (ref 8.4–10.5)
CHLORIDE: 100 meq/L (ref 96–112)
CO2: 27 mEq/L (ref 19–32)
Creatinine, Ser: 0.7 mg/dL (ref 0.50–1.10)
GFR calc Af Amer: >90 mL/min (ref >90)
GLUCOSE: 104 mg/dL — AB (ref 70–99)
Potassium: 4.1 mEq/L (ref 3.7–5.3)
SODIUM: 139 meq/L (ref 137–147)
Total Bilirubin: 0.4 mg/dL (ref 0.3–1.2)
Total Protein: 6.6 g/dL (ref 6.0–8.3)

## 2013-07-03 LAB — CBC WITH DIFFERENTIAL/PLATELET
BASOS ABS: 0 10*3/uL (ref 0.0–0.1)
BASOS PCT: 0 % (ref 0–1)
Eosinophils Absolute: 0.1 10*3/uL (ref 0.0–0.7)
Eosinophils Relative: 1 % (ref 0–5)
HCT: 40.6 % (ref 36.0–46.0)
Hemoglobin: 13.6 g/dL (ref 12.0–15.0)
LYMPHS ABS: 1.7 10*3/uL (ref 0.7–4.0)
LYMPHS PCT: 21 % (ref 12–46)
MCH: 30.5 pg (ref 26.0–34.0)
MCHC: 33.5 g/dL (ref 30.0–36.0)
MCV: 91 fL (ref 78.0–100.0)
Monocytes Absolute: 0.8 10*3/uL (ref 0.1–1.0)
Monocytes Relative: 10 % (ref 3–12)
NEUTROS ABS: 5.3 10*3/uL (ref 1.7–7.7)
NEUTROS PCT: 67 % (ref 43–77)
PLATELETS: 256 10*3/uL (ref 150–400)
RBC: 4.46 MIL/uL (ref 3.87–5.11)
RDW: 12.2 % (ref 11.5–15.5)
WBC: 7.9 10*3/uL (ref 4.0–10.5)

## 2013-07-03 LAB — URINALYSIS, ROUTINE W REFLEX MICROSCOPIC
Bilirubin Urine: NEGATIVE
GLUCOSE, UA: NEGATIVE mg/dL
Hgb urine dipstick: NEGATIVE
Ketones, ur: NEGATIVE mg/dL
LEUKOCYTES UA: NEGATIVE
Nitrite: NEGATIVE
PH: 8 (ref 5.0–8.0)
Protein, ur: NEGATIVE mg/dL
SPECIFIC GRAVITY, URINE: 1.011 (ref 1.005–1.030)
UROBILINOGEN UA: 0.2 mg/dL (ref 0.0–1.0)

## 2013-07-03 LAB — CBC
HEMATOCRIT: 39.2 % (ref 36.0–46.0)
HEMOGLOBIN: 13.2 g/dL (ref 12.0–15.0)
MCH: 30.4 pg (ref 26.0–34.0)
MCHC: 33.7 g/dL (ref 30.0–36.0)
MCV: 90.3 fL (ref 78.0–100.0)
Platelets: 260 10*3/uL (ref 150–400)
RBC: 4.34 MIL/uL (ref 3.87–5.11)
RDW: 12.3 % (ref 11.5–15.5)
WBC: 7.2 10*3/uL (ref 4.0–10.5)

## 2013-07-03 LAB — ETHANOL: Alcohol, Ethyl (B): <11 mg/dL (ref 0–11)

## 2013-07-03 LAB — CREATININE, SERUM
Creatinine, Ser: 0.73 mg/dL (ref 0.50–1.10)
GFR calc Af Amer: >90 mL/min (ref >90)
GFR calc non Af Amer: >90 mL/min (ref >90)

## 2013-07-03 LAB — TSH: TSH: 0.989 u[IU]/mL (ref 0.350–4.500)

## 2013-07-03 LAB — RAPID URINE DRUG SCREEN, HOSP PERFORMED
Amphetamines: NOT DETECTED
BENZODIAZEPINES: NOT DETECTED
Barbiturates: NOT DETECTED
Cocaine: NOT DETECTED
OPIATES: NOT DETECTED
Tetrahydrocannabinol: POSITIVE — AB

## 2013-07-03 LAB — TROPONIN I: Troponin I: <0.3 ng/mL (ref <0.30)

## 2013-07-03 MED ORDER — ACETAMINOPHEN 325 MG PO TABS: 650.0000 mg | ORAL_TABLET | Freq: Four times a day (QID) | ORAL | Status: DC | PRN

## 2013-07-03 MED ORDER — ONDANSETRON HCL 4 MG PO TABS: 4.0000 mg | ORAL_TABLET | Freq: Four times a day (QID) | ORAL | Status: DC | PRN

## 2013-07-03 MED ORDER — LORATADINE 10 MG PO TABS
10.0000 mg | ORAL_TABLET | Freq: Every day | ORAL | Status: DC
  Administered 2013-07-03 – 2013-07-05 (×3): 10 mg via ORAL
  Filled 2013-07-03 (×3): qty 1

## 2013-07-03 MED ORDER — BUPROPION HCL ER (XL) 300 MG PO TB24
300.0000 mg | ORAL_TABLET | Freq: Every day | ORAL | Status: DC
  Administered 2013-07-03 – 2013-07-05 (×3): 300 mg via ORAL
  Filled 2013-07-03 (×3): qty 1

## 2013-07-03 MED ORDER — SODIUM CHLORIDE 0.9 % IJ SOLN
3.0000 mL | Freq: Two times a day (BID) | INTRAMUSCULAR | Status: DC
  Administered 2013-07-03 – 2013-07-05 (×5): 3 mL via INTRAVENOUS

## 2013-07-03 MED ORDER — POLYETHYLENE GLYCOL 3350 17 G PO PACK
17.0000 g | PACK | Freq: Every day | ORAL | Status: DC | PRN
  Filled 2013-07-03: qty 1

## 2013-07-03 MED ORDER — ONDANSETRON HCL 4 MG/2ML IJ SOLN: 4.0000 mg | Freq: Four times a day (QID) | INTRAMUSCULAR | Status: DC | PRN

## 2013-07-03 MED ORDER — SODIUM CHLORIDE 0.9 % IV SOLN: 250.0000 mL | INTRAVENOUS | Status: DC | PRN

## 2013-07-03 MED ORDER — HEPARIN SODIUM (PORCINE) 5000 UNIT/ML IJ SOLN
5000.0000 [IU] | Freq: Three times a day (TID) | INTRAMUSCULAR | Status: DC
  Administered 2013-07-03 – 2013-07-04 (×3): 5000 [IU] via SUBCUTANEOUS
  Filled 2013-07-03 (×9): qty 1

## 2013-07-03 MED ORDER — SODIUM CHLORIDE 0.9 % IJ SOLN: 3.0000 mL | INTRAMUSCULAR | Status: DC | PRN

## 2013-07-03 MED ORDER — SODIUM CHLORIDE 0.9 % IJ SOLN: 3.0000 mL | Freq: Two times a day (BID) | INTRAMUSCULAR | Status: DC

## 2013-07-03 MED ORDER — ACETAMINOPHEN 650 MG RE SUPP: 650.0000 mg | Freq: Four times a day (QID) | RECTAL | Status: DC | PRN

## 2013-07-03 MED ORDER — SODIUM CHLORIDE 0.9 % IV SOLN
INTRAVENOUS | Status: AC
  Administered 2013-07-03: 11:00:00 via INTRAVENOUS
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
## 2013-07-03 NOTE — Progress Notes (Signed)
EEG Completed; Results Pending  

## 2013-07-03 NOTE — Progress Notes (Signed)
NURSING PROGRESS NOTE  Kristen Nichols [id number] Admission Data: 07/03/2013 6:51 PM Attending Kristen Nichols: Kristen Cousins, MD Kristen Nichols, Kristen Nichols Code Status: full  Kristen Nichols is a 58 y.o. female patient admitted from ED:  -No acute distress noted.  -No complaints of shortness of breath.  -No complaints of chest pain.   Cardiac Monitoring: Box # 19 in place. Cardiac monitor yields:normal sinus rhythm.  Blood pressure 135/84, pulse 87, temperature 98.3 F (36.8 C), temperature source Oral, resp. rate 18, height 5' 2"  (1.575 m), weight 63.957 kg (141 lb), SpO2 97.00%.   IV Fluids:  IV in place, occlusive dsg intact without redness, IV cath antecubital left, condition patent and no redness none.   Allergies:  Review of patient's allergies indicates no known allergies.  Past Medical History:   has a past medical history of Hypertension; Headache(784.0); and Colitis.  Past Surgical History:   has past surgical history that includes Ankle surgery; Cesarean section; and Nasal sinus surgery.  Social History:   reports that she has been smoking Cigarettes.  She has a 10 pack-year smoking history. She has never used smokeless tobacco. She reports that she drinks about 1.2 ounces of alcohol per week. She reports that she does not use illicit drugs.  Skin: intact  Patient/Family orientated to room. Information packet given to patient/family. Admission inpatient armband information verified with patient/family to include name and date of birth and placed on patient arm. Side rails up x 2, fall assessment and education completed with patient/family. Patient/family able to verbalize understanding of risk associated with falls and verbalized understanding to call for assistance before getting out of bed. Call light within reach. Patient/family able to voice and demonstrate understanding of unit orientation instructions.    Will continue to evaluate and treat per MD orders.  Kristen Renshaw, RN
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
## 2013-07-04 ENCOUNTER — Inpatient Hospital Stay (HOSPITAL_COMMUNITY): Payer: Federal, State, Local not specified - PPO

## 2013-07-04 DIAGNOSIS — I519 Heart disease, unspecified: Secondary | ICD-10-CM

## 2013-07-04 DIAGNOSIS — R55 Syncope and collapse: Principal | ICD-10-CM

## 2013-07-04 DIAGNOSIS — R51 Headache: Secondary | ICD-10-CM

## 2013-07-04 LAB — COMPREHENSIVE METABOLIC PANEL
ALBUMIN: 3.4 g/dL — AB (ref 3.5–5.2)
ALK PHOS: 88 U/L (ref 39–117)
ALT: 27 U/L (ref 0–35)
AST: 22 U/L (ref 0–37)
BUN: 13 mg/dL (ref 6–23)
CHLORIDE: 103 meq/L (ref 96–112)
CO2: 25 mEq/L (ref 19–32)
Calcium: 9.2 mg/dL (ref 8.4–10.5)
Creatinine, Ser: 0.68 mg/dL (ref 0.50–1.10)
GFR calc Af Amer: >90 mL/min (ref >90)
GFR calc non Af Amer: >90 mL/min (ref >90)
Glucose, Bld: 100 mg/dL — ABNORMAL HIGH (ref 70–99)
Potassium: 4 mEq/L (ref 3.7–5.3)
SODIUM: 143 meq/L (ref 137–147)
Total Protein: 6 g/dL (ref 6.0–8.3)

## 2013-07-04 LAB — TROPONIN I: Troponin I: <0.3 ng/mL (ref <0.30)

## 2013-07-04 LAB — VITAMIN B12: VITAMIN B 12: 564 pg/mL (ref 211–911)

## 2013-07-04 NOTE — Progress Notes (Signed)
  Echocardiogram 2D Echocardiogram has been performed.  Kristen Nichols FRANCES 07/04/2013, 2:19 PM
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
## 2013-07-05 DIAGNOSIS — F3289 Other specified depressive episodes: Secondary | ICD-10-CM

## 2013-07-05 DIAGNOSIS — F329 Major depressive disorder, single episode, unspecified: Secondary | ICD-10-CM

## 2013-07-05 NOTE — Progress Notes (Signed)
07/05/13  Patient being discharged today home. IV site removed, Discharge instructions reviewed by patient.
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
## 2014-01-08 HISTORY — PX: BREAST CYST ASPIRATION: SHX578

## 2014-10-11 ENCOUNTER — Other Ambulatory Visit: Payer: Self-pay

## 2014-10-11 DIAGNOSIS — Z1231 Encounter for screening mammogram for malignant neoplasm of breast: Secondary | ICD-10-CM

## 2014-10-20 ENCOUNTER — Ambulatory Visit
Admission: RE | Admit: 2014-10-20 | Discharge: 2014-10-20 | Disposition: A | Discharge status: Home / Self Care | Payer: Federal, State, Local not specified - PPO | Source: Ambulatory Visit

## 2014-10-20 DIAGNOSIS — Z1231 Encounter for screening mammogram for malignant neoplasm of breast: Secondary | ICD-10-CM

## 2014-10-22 ENCOUNTER — Other Ambulatory Visit: Payer: Self-pay | Admitting: Specialist

## 2014-10-22 DIAGNOSIS — R928 Other abnormal and inconclusive findings on diagnostic imaging of breast: Secondary | ICD-10-CM

## 2014-10-28 ENCOUNTER — Ambulatory Visit
Admission: RE | Admit: 2014-10-28 | Discharge: 2014-10-28 | Disposition: A | Discharge status: Home / Self Care | Payer: Federal, State, Local not specified - PPO | Source: Ambulatory Visit | Attending: Specialist | Admitting: Specialist

## 2014-10-28 ENCOUNTER — Other Ambulatory Visit: Payer: Self-pay | Admitting: Specialist

## 2014-10-28 DIAGNOSIS — N6310 Unspecified lump in the right breast, unspecified quadrant: Secondary | ICD-10-CM

## 2014-10-28 DIAGNOSIS — R928 Other abnormal and inconclusive findings on diagnostic imaging of breast: Secondary | ICD-10-CM

## 2014-11-03 ENCOUNTER — Ambulatory Visit
Admission: RE | Admit: 2014-11-03 | Discharge: 2014-11-03 | Disposition: A | Discharge status: Home / Self Care | Payer: Federal, State, Local not specified - PPO | Source: Ambulatory Visit | Attending: Specialist | Admitting: Specialist

## 2014-11-03 DIAGNOSIS — N6310 Unspecified lump in the right breast, unspecified quadrant: Secondary | ICD-10-CM

## 2014-12-29 DIAGNOSIS — G5600 Carpal tunnel syndrome, unspecified upper limb: Secondary | ICD-10-CM | POA: Insufficient documentation

## 2014-12-29 DIAGNOSIS — E785 Hyperlipidemia, unspecified: Secondary | ICD-10-CM | POA: Insufficient documentation

## 2015-01-06 ENCOUNTER — Other Ambulatory Visit: Payer: Self-pay | Admitting: Nurse Practitioner

## 2015-01-06 DIAGNOSIS — E559 Vitamin D deficiency, unspecified: Secondary | ICD-10-CM

## 2015-01-06 LAB — LIPID PANEL
Cholesterol: 191 mg/dL (ref 0–200)
HDL: 52 mg/dL (ref 35–70)
LDL CALC: 115 mg/dL
TRIGLYCERIDES: 118 mg/dL (ref 40–160)

## 2015-01-06 LAB — BASIC METABOLIC PANEL: Creatinine: 0.7 mg/dL (ref 0.5–1.1)

## 2015-01-09 HISTORY — PX: BREAST BIOPSY: SHX20

## 2015-02-08 ENCOUNTER — Other Ambulatory Visit: Payer: Federal, State, Local not specified - PPO

## 2015-02-25 ENCOUNTER — Inpatient Hospital Stay: Admission: RE | Admit: 2015-02-25 | Payer: Federal, State, Local not specified - PPO | Source: Ambulatory Visit

## 2015-03-09 ENCOUNTER — Ambulatory Visit
Admission: RE | Admit: 2015-03-09 | Discharge: 2015-03-09 | Disposition: A | Discharge status: Home / Self Care | Payer: Federal, State, Local not specified - PPO | Source: Ambulatory Visit | Attending: Nurse Practitioner | Admitting: Nurse Practitioner

## 2015-03-09 DIAGNOSIS — E559 Vitamin D deficiency, unspecified: Secondary | ICD-10-CM

## 2015-06-21 DIAGNOSIS — H43393 Other vitreous opacities, bilateral: Secondary | ICD-10-CM | POA: Diagnosis not present

## 2015-06-21 DIAGNOSIS — H00021 Hordeolum internum right upper eyelid: Secondary | ICD-10-CM | POA: Diagnosis not present

## 2015-07-15 DIAGNOSIS — H0011 Chalazion right upper eyelid: Secondary | ICD-10-CM | POA: Diagnosis not present

## 2015-08-09 DIAGNOSIS — H0011 Chalazion right upper eyelid: Secondary | ICD-10-CM | POA: Diagnosis not present

## 2015-11-16 ENCOUNTER — Other Ambulatory Visit: Payer: Self-pay | Admitting: Nurse Practitioner

## 2015-11-16 DIAGNOSIS — Z1231 Encounter for screening mammogram for malignant neoplasm of breast: Secondary | ICD-10-CM

## 2015-12-20 ENCOUNTER — Ambulatory Visit
Admission: RE | Admit: 2015-12-20 | Discharge: 2015-12-20 | Disposition: A | Discharge status: Home / Self Care | Payer: Federal, State, Local not specified - PPO | Source: Ambulatory Visit | Attending: Nurse Practitioner | Admitting: Nurse Practitioner

## 2015-12-20 DIAGNOSIS — Z1231 Encounter for screening mammogram for malignant neoplasm of breast: Secondary | ICD-10-CM | POA: Diagnosis not present

## 2015-12-22 ENCOUNTER — Other Ambulatory Visit: Payer: Self-pay | Admitting: Nurse Practitioner

## 2015-12-22 DIAGNOSIS — R928 Other abnormal and inconclusive findings on diagnostic imaging of breast: Secondary | ICD-10-CM

## 2015-12-28 ENCOUNTER — Ambulatory Visit
Admission: RE | Admit: 2015-12-28 | Discharge: 2015-12-28 | Disposition: A | Discharge status: Home / Self Care | Payer: Federal, State, Local not specified - PPO | Source: Ambulatory Visit | Attending: Nurse Practitioner | Admitting: Nurse Practitioner

## 2015-12-28 ENCOUNTER — Other Ambulatory Visit: Payer: Self-pay | Admitting: Nurse Practitioner

## 2015-12-28 DIAGNOSIS — N6312 Unspecified lump in the right breast, upper inner quadrant: Secondary | ICD-10-CM | POA: Diagnosis not present

## 2015-12-28 DIAGNOSIS — R928 Other abnormal and inconclusive findings on diagnostic imaging of breast: Secondary | ICD-10-CM

## 2015-12-28 DIAGNOSIS — N6310 Unspecified lump in the right breast, unspecified quadrant: Secondary | ICD-10-CM

## 2016-01-03 ENCOUNTER — Other Ambulatory Visit: Payer: Self-pay | Admitting: Nurse Practitioner

## 2016-01-03 DIAGNOSIS — N6310 Unspecified lump in the right breast, unspecified quadrant: Secondary | ICD-10-CM

## 2016-01-04 ENCOUNTER — Ambulatory Visit
Admission: RE | Admit: 2016-01-04 | Discharge: 2016-01-04 | Disposition: A | Discharge status: Home / Self Care | Payer: Federal, State, Local not specified - PPO | Source: Ambulatory Visit | Attending: Nurse Practitioner | Admitting: Nurse Practitioner

## 2016-01-04 ENCOUNTER — Other Ambulatory Visit (HOSPITAL_COMMUNITY): Payer: Self-pay | Admitting: Diagnostic Radiology

## 2016-01-04 DIAGNOSIS — N6312 Unspecified lump in the right breast, upper inner quadrant: Secondary | ICD-10-CM | POA: Diagnosis not present

## 2016-01-04 DIAGNOSIS — N6310 Unspecified lump in the right breast, unspecified quadrant: Secondary | ICD-10-CM

## 2016-01-04 DIAGNOSIS — N6011 Diffuse cystic mastopathy of right breast: Secondary | ICD-10-CM | POA: Diagnosis not present

## 2016-02-23 ENCOUNTER — Encounter: Payer: Self-pay | Admitting: Family Medicine

## 2016-02-23 ENCOUNTER — Ambulatory Visit (INDEPENDENT_AMBULATORY_CARE_PROVIDER_SITE_OTHER): Payer: Federal, State, Local not specified - PPO | Admitting: Family Medicine

## 2016-02-23 VITALS — BP 129/90 | HR 107 | Temp 98.0°F | Wt 137.0 lb

## 2016-02-23 DIAGNOSIS — F17200 Nicotine dependence, unspecified, uncomplicated: Secondary | ICD-10-CM | POA: Diagnosis not present

## 2016-02-23 DIAGNOSIS — R0982 Postnasal drip: Secondary | ICD-10-CM

## 2016-02-23 DIAGNOSIS — F325 Major depressive disorder, single episode, in full remission: Secondary | ICD-10-CM

## 2016-02-23 DIAGNOSIS — B20 Human immunodeficiency virus [HIV] disease: Secondary | ICD-10-CM

## 2016-02-23 DIAGNOSIS — Z1159 Encounter for screening for other viral diseases: Secondary | ICD-10-CM

## 2016-02-23 DIAGNOSIS — Z23 Encounter for immunization: Secondary | ICD-10-CM | POA: Diagnosis not present

## 2016-02-23 DIAGNOSIS — G4700 Insomnia, unspecified: Secondary | ICD-10-CM | POA: Diagnosis not present

## 2016-02-23 DIAGNOSIS — Z114 Encounter for screening for human immunodeficiency virus [HIV]: Secondary | ICD-10-CM

## 2016-02-23 MED ORDER — DICLOFENAC SODIUM 1 % TD GEL: 2.0000 g | Freq: Four times a day (QID) | TRANSDERMAL | 11 refills | Status: DC

## 2016-02-23 MED ORDER — OMEPRAZOLE 40 MG PO CPDR: 40.0000 mg | DELAYED_RELEASE_CAPSULE | Freq: Every day | ORAL | 3 refills | Status: DC

## 2016-02-23 MED ORDER — BUPROPION HCL ER (XL) 150 MG PO TB24: 150.0000 mg | ORAL_TABLET | Freq: Every day | ORAL | 1 refills | Status: DC

## 2016-02-23 MED ORDER — FLUTICASONE PROPIONATE 50 MCG/ACT NA SUSP: 2.0000 | Freq: Every day | NASAL | 6 refills | Status: DC

## 2016-02-23 MED ORDER — HYDROCHLOROTHIAZIDE 25 MG PO TABS: 25.0000 mg | ORAL_TABLET | Freq: Every day | ORAL | 1 refills | Status: DC

## 2016-02-23 MED ORDER — FLUOXETINE HCL 20 MG PO TABS: 20.0000 mg | ORAL_TABLET | Freq: Every day | ORAL | 1 refills | Status: DC

## 2016-02-23 NOTE — Progress Notes (Signed)
Kristen Nichols is a 61 y.o. female who presents to [facility] today to establish care.  She had an isolated episode of reflux last night and then woke up early in the morning with a sore throat and chest burning. This has never happened before. Also complains of poor sleep; getting 5 hours a night and waking up in the middle of the night. She watches TV before bed and eats meals fairly late. Also endorses postnasal drainage, particularly when she walks outside; takes allegra for seasonal allergies but not recently.  Thumb Pain: For the past month, she has noticed pain in her right thumb in the MCP area. She rides horses frequently and notices this pain after riding. Would like to avoid injections if possible.   Smoking: She has been using a patch (14) and gum to try to quit smoking for the past month, but she has smoked a few times and does not feel that this is completely effective. She has previously tried Wellbutrin and tolerated it well, but switched to fluoxetine for anxiety/depression and feels like her mood symptoms are well controlled.  Health maintenance: Follows w/ OB-GYN and had a normal pap smear 2 years ago. Mammogram last year with a biopsy around Christmas time. Normal bone density test last year. Had a colonoscopy a few years ago that had polyps and early diverticulosis; will have a 5-year follow up.  Past Medical History:  Diagnosis Date  . Allergy    Hayfever  . Colitis   . Headache(784.0)   . Hypertension    Past Surgical History:  Procedure Laterality Date  . ANKLE SURGERY    . CESAREAN SECTION    . NASAL SINUS SURGERY     Social History  Substance Use Topics  . Smoking status: Former Smoker    Packs/day: 0.50    Years: 20.00    Types: Cigarettes    Quit date: 01/09/2016  . Smokeless tobacco: Never Used  . Alcohol use 1.2 oz/week    2 Cans  of beer per week   family history includes Diabetes in her maternal grandmother; Other in her father and mother.  ROS as above: No headache, visual changes, nausea, vomiting, diarrhea, constipation, dizziness, abdominal pain, skin rash, fevers, chills, night sweats, weight loss, swollen lymph nodes, body aches, joint swelling, muscle aches, chest pain, shortness of breath, mood changes, visual or auditory hallucinations.    Medications: Current Outpatient Prescriptions  Medication Sig Dispense Refill  . buPROPion (WELLBUTRIN XL) 150 MG 24 hr tablet Take 1 tablet (150 mg total) by mouth daily. 90 tablet 1  . diclofenac sodium (VOLTAREN) 1 % GEL Apply 2 g topically 4 (four) times daily. To affected joint. 100 g 11  . fexofenadine (ALLEGRA) 180 MG tablet Take 180 mg by mouth daily as needed for allergies or rhinitis.    Marland Kitchen FLUoxetine (PROZAC) 20 MG tablet Take 1 tablet (20 mg total) by mouth daily. 90 tablet 1  . fluticasone (FLONASE) 50 MCG/ACT nasal spray Place 2 sprays into both nostrils daily. 16 g 6  . hydrochlorothiazide (HYDRODIURIL) 25 MG tablet Take 1 tablet (25 mg total) by mouth daily. 90 tablet 1  . ibuprofen (ADVIL,MOTRIN) 200 MG tablet Take 600-800 mg by mouth every 6 (six) hours as needed for moderate pain.    Marland Kitchen omeprazole (PRILOSEC) 40 MG capsule Take 1 capsule (40 mg total) by mouth daily. 30 capsule 3   No current facility-administered  medications for this visit.    No Known Allergies  Health Maintenance Health Maintenance  Topic Date Due  . Hepatitis C Screening  [birth year]  . PAP SMEAR  12/23/1978  . COLONOSCOPY  12/23/2007  . TETANUS/TDAP  01/08/2010  . MAMMOGRAM  12/19/2017  . INFLUENZA VACCINE  Completed  . HIV Screening  Completed     Exam:  BP 129/90 (BP Location: Right Arm, Patient Position: Sitting, Cuff Size: Normal)   Pulse (!) 107   Temp 98 F (36.7 C) (Oral)   Wt 137 lb (62.1 kg)   SpO2 99%   BMI 25.06 kg/m  Gen: Well NAD HEENT: EOMI,   MMM Lungs: Normal work of breathing. CTABL Heart: RRR no MRG Abd: NABS, Soft. Nondistended, Nontender Exts: Brisk capillary refill, warm and well perfused.  MSK: Right MCP with bossing and tenderness to palpation. Pain on thumb flexion, resisted extension, and abduction.   Depression screen [facility] 2/9 02/23/2016  Decreased Interest 0  Down, Depressed, Hopeless 0  PHQ - 2 Score 0  Altered sleeping 3  Tired, decreased energy 1  Change in appetite 0  Feeling bad or failure about yourself  0  Trouble concentrating 0  Moving slowly or fidgety/restless 0  Suicidal thoughts 0  PHQ-9 Score 4   GAD 7 : Generalized Anxiety Score 02/23/2016  Nervous, Anxious, on Edge 1  Control/stop worrying 2  Worry too much - different things 2  Trouble relaxing 2  Restless 1  Easily annoyed or irritable 0  Afraid - awful might happen 0  Total GAD 7 Score 8    No results found for this or any previous visit (from the past 72 hour(s)). No results found.  Assessment and Plan: 61 y.o. female with:  Depression/anxiety: Doing well on current therapy.  - Continue fluoxetine - Add bupropion for smoking cessation, below  Right MCP joint pain: Likely due to DJD. - Diclofenac gel   Reflux: Isolated episode at night. Discussed avoiding late night meals. - Omeprazole  Smoking cessation: Currently using 14 mg patches and gum PRN with some efficacy. - Set a quit date - Start bupropion - Reduce patch to 7 mg; continue gum We spent 5 minutes discussing smoking cessation options.   Health maintenance: - Labs as below - Get records from [facility] and gastroenterology  Follow up in 3-6 months.   Orders Placed This Encounter  Procedures  . Flu Vaccine QUAD 36+ mos PF IM (Fluarix & Fluzone Quad PF)  . CBC  . COMPLETE METABOLIC PANEL WITH GFR  . Lipid Panel w/reflex Direct LDL  . VITAMIN D 25 Hydroxy (Vit-D Deficiency, Fractures)  . HIV antibody  . Hepatitis C antibody  . TSH   Meds ordered this  encounter  Medications  . hydrochlorothiazide (HYDRODIURIL) 25 MG tablet    Sig: Take 1 tablet (25 mg total) by mouth daily.    Dispense:  90 tablet    Refill:  1  . FLUoxetine (PROZAC) 20 MG tablet    Sig: Take 1 tablet (20 mg total) by mouth daily.    Dispense:  90 tablet    Refill:  1  . fluticasone (FLONASE) 50 MCG/ACT nasal spray    Sig: Place 2 sprays into both nostrils daily.    Dispense:  16 g    Refill:  6  . buPROPion (WELLBUTRIN XL) 150 MG 24 hr tablet    Sig: Take 1 tablet (150 mg total) by mouth daily.    Dispense:  90 tablet  Refill:  1  . omeprazole (PRILOSEC) 40 MG capsule    Sig: Take 1 capsule (40 mg total) by mouth daily.    Dispense:  30 capsule    Refill:  3  . diclofenac sodium (VOLTAREN) 1 % GEL    Sig: Apply 2 g topically 4 (four) times daily. To affected joint.    Dispense:  100 g    Refill:  11     Discussed warning signs or symptoms. Please see discharge instructions. Patient expresses understanding.

## 2016-02-23 NOTE — Progress Notes (Signed)
Pt here to establish care.

## 2016-02-23 NOTE — Patient Instructions (Signed)
Thank you for coming in today. START wellbutrin.  Reduce patch to 60m. Continue gum as needed.  SET a quit date.  Get fasting labs soon.  [facility]   Return in 3-6 months for recheck.    Work on sPublishing rights manager  Insomnia Insomnia is a sleep disorder that makes it difficult to fall asleep or to stay asleep. Insomnia can cause tiredness (fatigue), low energy, difficulty concentrating, mood swings, and poor performance at work or school. There are three different ways to classify insomnia:  Difficulty falling asleep.  Difficulty staying asleep.  Waking up too early in the morning. Any type of insomnia can be long-term (chronic) or short-term (acute). Both are common. Short-term insomnia usually lasts for three months or less. Chronic insomnia occurs at least three times a week for longer than three months. What are the causes? Insomnia may be caused by another condition, situation, or substance, such as:  Anxiety.  Certain medicines.  Gastroesophageal reflux disease (GERD) or other gastrointestinal conditions.  Asthma or other breathing conditions.  Restless legs syndrome, sleep apnea, or other sleep disorders.  Chronic pain.  Menopause. This may include hot flashes.  Stroke.  Abuse of alcohol, tobacco, or illegal drugs.  Depression.  Caffeine.  Neurological disorders, such as Alzheimer disease.  An overactive thyroid (hyperthyroidism). The cause of insomnia may not be known. What increases the risk? Risk factors for insomnia include:  Gender. Women are more commonly affected than men.  Age. Insomnia is more common as you get older.  Stress. This may involve your professional or personal life.  Income. Insomnia is more common in people with lower income.  Lack of exercise.  Irregular work schedule or night shifts.  Traveling between different time zones. What are the signs or symptoms? If you have insomnia,  trouble falling asleep or trouble staying asleep is the main symptom. This may lead to other symptoms, such as:  Feeling fatigued.  Feeling nervous about going to sleep.  Not feeling rested in the morning.  Having trouble concentrating.  Feeling irritable, anxious, or depressed. How is this treated? Treatment for insomnia depends on the cause. If your insomnia is caused by an underlying condition, treatment will focus on addressing the condition. Treatment may also include:  Medicines to help you sleep.  Counseling or therapy.  Lifestyle adjustments. Follow these instructions at home:  Take medicines only as directed by your health care provider.  Keep regular sleeping and waking hours. Avoid naps.  Keep a sleep diary to help you and your health care provider figure out what could be causing your insomnia. Include:  When you sleep.  When you wake up during the night.  How well you sleep.  How rested you feel the next day.  Any side effects of medicines you are taking.  What you eat and drink.  Make your bedroom a comfortable place where it is easy to fall asleep:  Put up shades or special blackout curtains to block light from outside.  Use a white noise machine to block noise.  Keep the temperature cool.  Exercise regularly as directed by your health care provider. Avoid exercising right before bedtime.  Use relaxation techniques to manage stress. Ask your health care provider to suggest some techniques that may work well for you. These may include:  Breathing exercises.  Routines to release muscle tension.  Visualizing peaceful scenes.  Cut back on alcohol, caffeinated beverages, and cigarettes, especially close to bedtime. These  can disrupt your sleep.  Do not overeat or eat spicy foods right before bedtime. This can lead to digestive discomfort that can make it hard for you to sleep.  Limit screen use before bedtime. This includes:  Watching  TV.  Using your smartphone, tablet, and computer.  Stick to a routine. This can help you fall asleep faster. Try to do a quiet activity, brush your teeth, and go to bed at the same time each night.  Get out of bed if you are still awake after 15 minutes of trying to sleep. Keep the lights down, but try reading or doing a quiet activity. When you feel sleepy, go back to bed.  Make sure that you drive carefully. Avoid driving if you feel very sleepy.  Keep all follow-up appointments as directed by your health care provider. This is important. Contact a health care provider if:  You are tired throughout the day or have trouble in your daily routine due to sleepiness.  You continue to have sleep problems or your sleep problems get worse. Get help right away if:  You have serious thoughts about hurting yourself or someone else. This information is not intended to replace advice given to you by your health care provider. Make sure you discuss any questions you have with your health care provider. Document Released: 12/23/1999 Document Revised: 05/27/2015 Document Reviewed: 09/25/2013 Elsevier Interactive Patient Education  2017 [employer].

## 2016-03-04 ENCOUNTER — Encounter: Payer: Self-pay | Admitting: Family Medicine

## 2016-03-05 ENCOUNTER — Encounter: Payer: Self-pay | Admitting: Family Medicine

## 2016-03-05 ENCOUNTER — Telehealth: Payer: Self-pay | Admitting: Family Medicine

## 2016-03-05 DIAGNOSIS — I10 Essential (primary) hypertension: Secondary | ICD-10-CM | POA: Diagnosis not present

## 2016-03-05 DIAGNOSIS — K5731 Diverticulosis of large intestine without perforation or abscess with bleeding: Secondary | ICD-10-CM | POA: Diagnosis not present

## 2016-03-05 DIAGNOSIS — Z79899 Other long term (current) drug therapy: Secondary | ICD-10-CM | POA: Diagnosis not present

## 2016-03-05 DIAGNOSIS — F329 Major depressive disorder, single episode, unspecified: Secondary | ICD-10-CM | POA: Diagnosis not present

## 2016-03-05 DIAGNOSIS — R110 Nausea: Secondary | ICD-10-CM | POA: Diagnosis not present

## 2016-03-05 DIAGNOSIS — K529 Noninfective gastroenteritis and colitis, unspecified: Secondary | ICD-10-CM | POA: Diagnosis not present

## 2016-03-05 DIAGNOSIS — K922 Gastrointestinal hemorrhage, unspecified: Secondary | ICD-10-CM | POA: Diagnosis not present

## 2016-03-05 DIAGNOSIS — F419 Anxiety disorder, unspecified: Secondary | ICD-10-CM | POA: Diagnosis not present

## 2016-03-05 DIAGNOSIS — Z87891 Personal history of nicotine dependence: Secondary | ICD-10-CM | POA: Diagnosis not present

## 2016-03-05 NOTE — Telephone Encounter (Signed)
Called and spoke with Pt. Pt states she is have GI pain, cramping, and there is blood/mucous in her stool. Took laxative x24 hour with minimal relief. Advised to visit ED due to bleeding and mucous. Verbalized understanding. No further questions.

## 2016-03-05 NOTE — Telephone Encounter (Signed)
Pt called back office line and spoke with Dr Sheppard Coil. She is apparently having GI bleeding.  Recommend Triage call pt back and figure out details.

## 2016-03-06 DIAGNOSIS — I10 Essential (primary) hypertension: Secondary | ICD-10-CM | POA: Diagnosis not present

## 2016-03-06 DIAGNOSIS — K922 Gastrointestinal hemorrhage, unspecified: Secondary | ICD-10-CM | POA: Diagnosis not present

## 2016-03-06 DIAGNOSIS — K529 Noninfective gastroenteritis and colitis, unspecified: Secondary | ICD-10-CM | POA: Diagnosis not present

## 2016-03-06 DIAGNOSIS — F419 Anxiety disorder, unspecified: Secondary | ICD-10-CM | POA: Diagnosis not present

## 2016-03-07 DIAGNOSIS — K529 Noninfective gastroenteritis and colitis, unspecified: Secondary | ICD-10-CM | POA: Diagnosis not present

## 2016-03-07 DIAGNOSIS — F419 Anxiety disorder, unspecified: Secondary | ICD-10-CM | POA: Diagnosis not present

## 2016-03-07 DIAGNOSIS — I10 Essential (primary) hypertension: Secondary | ICD-10-CM | POA: Diagnosis not present

## 2016-03-07 DIAGNOSIS — K922 Gastrointestinal hemorrhage, unspecified: Secondary | ICD-10-CM | POA: Diagnosis not present

## 2016-03-18 ENCOUNTER — Encounter: Payer: Self-pay | Admitting: Family Medicine

## 2016-03-20 ENCOUNTER — Encounter: Payer: Self-pay | Admitting: Family Medicine

## 2016-03-20 LAB — CHG ASSAY OF BLOOD LIPOPROTEIN,VLDL CHOLEST: VLDL CHOLESTEROL CAL: 24

## 2016-04-09 ENCOUNTER — Encounter: Payer: Self-pay | Admitting: Family Medicine

## 2016-04-09 ENCOUNTER — Ambulatory Visit (INDEPENDENT_AMBULATORY_CARE_PROVIDER_SITE_OTHER): Payer: Federal, State, Local not specified - PPO | Admitting: Family Medicine

## 2016-04-09 DIAGNOSIS — K529 Noninfective gastroenteritis and colitis, unspecified: Secondary | ICD-10-CM

## 2016-04-09 NOTE — Patient Instructions (Signed)
Thank you for coming in today. Return in a month or so.  You should hear from the stomach doctor soon.  If your belly pain worsens, or you have high fever, bad vomiting, blood in your stool or black tarry stool go to the Emergency Room.     Colitis Colitis is inflammation of the colon. Colitis may last a short time (acute) or it may last a long time (chronic). What are the causes? This condition may be caused by:  Viruses.  Bacteria.  Reactions to medicine.  Certain autoimmune diseases, such as Crohn disease or ulcerative colitis. What are the signs or symptoms? Symptoms of this condition include:  Diarrhea.  Passing bloody or tarry stool.  Pain.  Fever.  Vomiting.  Tiredness (fatigue).  Weight loss.  Bloating.  Sudden increase in abdominal pain.  Having fewer bowel movements than usual. How is this diagnosed? This condition is diagnosed with a stool test or a blood test. You may also have other tests, including X-rays, a CT scan, or a colonoscopy. How is this treated? Treatment may include:  Resting the bowel. This involves not eating or drinking for a period of time.  Fluids that are given through an IV tube.  Medicine for pain and diarrhea.  Antibiotic medicines.  Cortisone medicines.  Surgery. Follow these instructions at home: Eating and drinking   Follow instructions from your health care provider about eating or drinking restrictions.  Drink enough fluid to keep your urine clear or pale yellow.  Work with a dietitian to determine which foods cause your condition to flare up.  Avoid foods that cause flare-ups.  Eat a well-balanced diet. Medicines   Take over-the-counter and prescription medicines only as told by your health care provider.  If you were prescribed an antibiotic medicine, take it as told by your health care provider. Do not stop taking the antibiotic even if you start to feel better. General instructions   Keep all  follow-up visits as told by your health care provider. This is important. Contact a health care provider if:  Your symptoms do not go away.  You develop new symptoms. Get help right away if:  You have a fever that does not go away with treatment.  You develop chills.  You have extreme weakness, fainting, or dehydration.  You have repeated vomiting.  You develop severe pain in your abdomen.  You pass bloody or tarry stool. This information is not intended to replace advice given to you by your health care provider. Make sure you discuss any questions you have with your health care provider. Document Released: 02/02/2004 Document Revised: 06/02/2015 Document Reviewed: 04/19/2014 Elsevier Interactive Patient Education  2017 [employer].

## 2016-04-09 NOTE — Progress Notes (Signed)
Kristen Nichols is a 61 y.o. female who presents to [facility]: [place] today for follow-up after an emergency room visit for acute colitis.  She is still having sharp transient pain in her lower left side that comes on 30-40 minutes after meals and last about a minute. This was associated with blood in the stool. She is feeling much better now but does continue to have some symptoms. She is frequently nauseous as well.  She has been having a1 bowel movement a day until today where she had six bowl movements.  She denies bloody or foul smelling stools or constipation. She denies any recent travel and has no sick contacts.    Past Medical History:  Diagnosis Date  . Allergy    Hayfever  . Colitis   . Headache(784.0)   . Hypertension    Past Surgical History:  Procedure Laterality Date  . ANKLE SURGERY    . CESAREAN SECTION    . NASAL SINUS SURGERY     Social History  Substance Use Topics  . Smoking status: Former Smoker    Packs/day: 0.50    Years: 20.00    Types: Cigarettes    Quit date: 01/09/2016  . Smokeless tobacco: Never Used  . Alcohol use 1.2 oz/week    2 Cans of beer per week   family history includes Diabetes in her maternal grandmother; Other in her father and mother.  ROS as above:  Medications: Current Outpatient Prescriptions  Medication Sig Dispense Refill  . buPROPion (WELLBUTRIN XL) 150 MG 24 hr tablet Take 1 tablet (150 mg total) by mouth daily. 90 tablet 1  . diclofenac sodium (VOLTAREN) 1 % GEL Apply 2 g topically 4 (four) times daily. To affected joint. 100 g 11  . fexofenadine (ALLEGRA) 180 MG tablet Take 180 mg by mouth daily as needed for allergies or rhinitis.    Marland Kitchen FLUoxetine (PROZAC) 20 MG tablet Take 1 tablet (20 mg total) by mouth daily. 90 tablet 1  . fluticasone (FLONASE) 50 MCG/ACT nasal spray Place 2 sprays into both nostrils daily.  16 g 6  . hydrochlorothiazide (HYDRODIURIL) 25 MG tablet Take 1 tablet (25 mg total) by mouth daily. 90 tablet 1  . ibuprofen (ADVIL,MOTRIN) 200 MG tablet Take 600-800 mg by mouth every 6 (six) hours as needed for moderate pain.    Marland Kitchen omeprazole (PRILOSEC) 40 MG capsule Take 1 capsule (40 mg total) by mouth daily. 30 capsule 3   No current facility-administered medications for this visit.    No Known Allergies  Health Maintenance Health Maintenance  Topic Date Due  . Hepatitis C Screening  23-Apr-1955  . TETANUS/TDAP  01/08/2010  . PAP SMEAR  03/27/2014  . INFLUENZA VACCINE  08/08/2016  . MAMMOGRAM  12/19/2017  . COLONOSCOPY  09/20/2022  . HIV Screening  Completed     Exam:  BP 115/75   Pulse (!) 103   Temp 98.6 F (37 C) (Oral)   Wt 140 lb (63.5 kg)   SpO2 99%   BMI 25.61 kg/m  Gen: Well NAD. Non-toxic appearing HEENT: EOMI,  MMM Lungs: Normal work of breathing. CTABL Heart: Hr 80 bpm no MRG Abd: NABS, Soft. Nondistended. Mild Tender at the LLQ and periumbical area. No guarding or rebound TTP.  Exts: Brisk capillary refill, warm and well perfused.   Ct Abdomen Pelvis W   Result Date: 03/05/2016 CT abdomen and pelvis with contrast: INDICATION: Left-sided abdominal  pain. Nausea. TECHNIQUE: Axial scans were performed through the abdomen and pelvis with intravenous contrast. Contrast-80 mL Isovue-370. Radiation dose reduction was utilized (automated exposure control, mA or kV adjustment based on patient size, or iterative image reconstruction). COMPARISON: CT abdomen and pelvis 09/06/2012. FINDINGS: # Lung bases: Normal. # Liver: Normal enhancement. No focal lesions. # Gallbladder: No calcifications # Spleen: Normal. # Pancreas: Normal # Adrenal glands: Normal. # Kidneys: Normal. # Retroperitoneum: No mass or adenopathy. # Aorta: No significant abnormality. # Bowel and mesentery: No small bowel dilatation. There is thickening of the wall of the distal transverse colon, splenic  flexure, and left colon with mild edematous change in the fat surrounding this thickened colon. This suggest focal colitis involving these areas. # Terminal ileum: Normal. # Appendix: Normal Pelvis: # No mass or adenopathy. # No inflammatory changes seen. # Bladder: No significant abnormality. # # Bony structures: No significant abnormality.   IMPRESSION: Colitis affecting the distal transverse colon, splenic flexure, and left colon.   No results found for this or any previous visit (from the past 72 hour(s)). No results found.    Assessment and Plan: 61 y.o. female with nausea and lower abdominal pain persisting four weeks after hospitalization for acute colitis. Improved overall Plan to refer to GI for further workup. Likely colonoscopy.    Orders Placed This Encounter  Procedures  . Ambulatory referral to Gastroenterology    Referral Priority:   Routine    Referral Type:   Consultation    Referral Reason:   Specialty Services Required    Requested Specialty:   Gastroenterology    Number of Visits Requested:   1   No orders of the defined types were placed in this encounter.    Discussed warning signs or symptoms. Please see discharge instructions. Patient expresses understanding.

## 2016-05-03 DIAGNOSIS — K5730 Diverticulosis of large intestine without perforation or abscess without bleeding: Secondary | ICD-10-CM | POA: Diagnosis not present

## 2016-05-03 DIAGNOSIS — K529 Noninfective gastroenteritis and colitis, unspecified: Secondary | ICD-10-CM | POA: Diagnosis not present

## 2016-05-03 DIAGNOSIS — K5900 Constipation, unspecified: Secondary | ICD-10-CM | POA: Diagnosis not present

## 2016-06-22 ENCOUNTER — Other Ambulatory Visit: Payer: Self-pay | Admitting: Family Medicine

## 2016-08-21 ENCOUNTER — Other Ambulatory Visit: Payer: Self-pay | Admitting: Family Medicine

## 2016-10-15 ENCOUNTER — Other Ambulatory Visit: Payer: Self-pay | Admitting: Family Medicine

## 2016-10-22 ENCOUNTER — Ambulatory Visit (INDEPENDENT_AMBULATORY_CARE_PROVIDER_SITE_OTHER): Payer: Federal, State, Local not specified - PPO | Admitting: Family Medicine

## 2016-10-22 ENCOUNTER — Encounter: Payer: Self-pay | Admitting: Family Medicine

## 2016-10-22 VITALS — BP 114/67 | HR 81 | Temp 98.1°F

## 2016-10-22 DIAGNOSIS — H9313 Tinnitus, bilateral: Secondary | ICD-10-CM | POA: Diagnosis not present

## 2016-10-22 DIAGNOSIS — H6690 Otitis media, unspecified, unspecified ear: Secondary | ICD-10-CM | POA: Diagnosis not present

## 2016-10-22 MED ORDER — PREDNISONE 50 MG PO TABS: 50.0000 mg | ORAL_TABLET | Freq: Every day | ORAL | 0 refills | Status: DC

## 2016-10-22 NOTE — Progress Notes (Signed)
Kristen Nichols is a 61 y.o. female who presents to [place]: [place] today for left ear pain. Patient has bilateral ear pain left worse than right present for a few weeks. This is associated with new onset bothersome tinnitus. She denies any vertigo or dizziness and feels well otherwise. She denies any fevers chills nausea vomiting or diarrhea. She's tried taking allergy medicines which have helped a little.   Past Medical History:  Diagnosis Date  . Allergy    Hayfever  . Colitis   . Headache(784.0)   . Hypertension    Past Surgical History:  Procedure Laterality Date  . ANKLE SURGERY    . CESAREAN SECTION    . NASAL SINUS SURGERY     Social History  Substance Use Topics  . Smoking status: Former Smoker    Packs/day: 0.50    Years: 20.00    Types: Cigarettes    Quit date: 01/09/2016  . Smokeless tobacco: Never Used  . Alcohol use 1.2 oz/week    2 Cans of beer per week   family history includes Diabetes in her maternal grandmother; Other in her father and mother.  ROS as above:  Medications: Current Outpatient Prescriptions  Medication Sig Dispense Refill  . buPROPion (WELLBUTRIN XL) 150 MG 24 hr tablet TAKE 1 TABLET (150 MG TOTAL) BY MOUTH DAILY. 90 tablet 1  . diclofenac sodium (VOLTAREN) 1 % GEL Apply 2 g topically 4 (four) times daily. To affected joint. 100 g 11  . fexofenadine (ALLEGRA) 180 MG tablet Take 180 mg by mouth daily as needed for allergies or rhinitis.    Marland Kitchen FLUoxetine (PROZAC) 20 MG tablet TAKE 1 TABLET (20 MG TOTAL) BY MOUTH DAILY. 90 tablet 1  . fluticasone (FLONASE) 50 MCG/ACT nasal spray Place 2 sprays into both nostrils daily. 16 g 6  . hydrochlorothiazide (HYDRODIURIL) 25 MG tablet TAKE 1 TABLET (25 MG TOTAL) BY MOUTH DAILY. 90 tablet 1  . ibuprofen (ADVIL,MOTRIN) 200 MG tablet Take 600-800 mg by mouth every 6 (six) hours as needed for  moderate pain.    Marland Kitchen omeprazole (PRILOSEC) 40 MG capsule TAKE 1 CAPSULE (40 MG TOTAL) BY MOUTH DAILY. 30 capsule 3   No current facility-administered medications for this visit.    No Known Allergies  Health Maintenance Health Maintenance  Topic Date Due  . Hepatitis C Screening  08-03-1955  . PAP SMEAR  03/27/2014  . TETANUS/TDAP  11/22/2016 (Originally 01/08/2010)  . INFLUENZA VACCINE  10/22/2017 (Originally 08/08/2016)  . MAMMOGRAM  12/19/2017  . COLONOSCOPY  09/20/2022  . HIV Screening  Completed     Exam:  BP 114/67   Pulse 81   Temp 98.1 F (36.7 C) (Oral)   SpO2 99%  Gen: Well NAD HEENT: EOMI,  MMM Tympanic membranes are retracted bilaterally but otherwise normal appearing normal turbinates and posterior pharynx. No cervical lymphadenopathy sinuses are nontender Lungs: Normal work of breathing. CTABL Heart: RRR no MRG Abd: NABS, Soft. Nondistended, Nontender Exts: Brisk capillary refill, warm and well perfused.  Neuro normal gait no nystagmus or ataxia   No results found for this or any previous visit (from the past 72 hour(s)). No results found.    Assessment and Plan: 61 y.o. female with  Bilateral ear pain due to retracted tympanic membranes. This is likely due to eustachian tube dysfunction. This does not really explain the new onset tinnitus. She does not appear to have any vestibular issues  however. Treat empirically with a short course of steroids and watchful waiting if not better likely will refer.  Recommend patient return to clinic in about a month for wellness visit to address health maintenance items.   No orders of the defined types were placed in this encounter.  No orders of the defined types were placed in this encounter.    Discussed warning signs or symptoms. Please see discharge instructions. Patient expresses understanding.

## 2016-10-22 NOTE — Patient Instructions (Signed)
Thank you for coming in today. Take prednisone daily for 5 days.  Use a fan or white noise to drown out tinnitus.  Schedule a well visit in about 1 months.     Barotitis Media Barotitis media is inflammation of the middle ear. This condition occurs when an auditory tube (eustachian tube) is blocked in one or both ears. These tubes lead from the middle ear to the back of the nose (nasopharynx). This condition typically occurs when you experience changes in pressure, such as when flying or scuba diving. Untreated barotitis media may lead to damage or hearing loss (barotrauma), which may become permanent. What are the causes? This condition may be caused by changes in air pressure from:  Flying.  Scuba diving.  A nearby explosion.  What increases the risk? The following factors may make you more likely to develop this condition:  Middle ear infection.  Sinus infection.  A cold.  Environmental allergies.  Small eustachian tubes.  Recent ear surgery.  What are the signs or symptoms? Symptoms of this condition may include:  Ear pain.  Hearing loss.  In severe cases, symptoms can include:  Dizziness and nausea (vertigo).  Temporary facial paralysis.  How is this diagnosed? This condition is diagnosed based on:  A physical exam. Your health care provider may: ? Use a device (otoscope) to look into your ear canal and check your eardrum. ? Do a test that changes air pressure in the middle ear to check how well the eardrum moves and to see if the eustachian tube is working(tympanogram).  Your medical history.  In some cases, your health care provider may have you take a hearing test. You may also be referred to someone who specializes in ear treatment (otolaryngologist, "ENT"). How is this treated? This condition may be treated with:  Medicines to relieve congestion in your nose, sinus, or upper respiratory tract (decongestants).  Techniques to equalize pressure (to  "pop" your ears), such as: ? Yawning. ? Chewing gum. ? Swallowing.  In severe cases, you may need surgery to relieve your symptoms or to prevent future inflammation. Follow these instructions at home:  Take over-the-counter and prescription medicines only as told by your health care provider.  Do not put anything into your ears to clean or unplug them. Ear drops will not help.  Keep all follow-up visits as told by your health care provider. This is important. How is this prevented? Using these strategies may help to prevent barotitis media:  Chewing gum with frequent, forceful swallowing during takeoff and landing when flying.  Holding your nose and gently blowing to pop your ears for equalizing pressure changes. This forces air into the eustachian tube.  Yawning during air pressure changes.  Using a nasal decongestant about 30-60 minutes before flying, if you have nasal congestion.  Contact a health care provider if:  You have vertigo.  You have hearing loss.  Your symptoms do not get better or they get worse.  You have a fever. Get help right away if:  You have a severe headache, ear pain, and dizziness.  You have balance problems.  You cannot move or feel part of your face.  You have bloody or pus-like drainage from your ears. Summary  Barotitis media is inflammation of the middle ear.  This condition typically occurs when you experience changes in pressure, such as when flying or scuba diving.  You may be at a higher risk for this condition if you have small eustachian tubes, had  recent ear surgery, or have allergies, a cold, or sinus or middle ear infection.  This condition may be treated with medicines or techniques to equalize pressure in your ears.  Strategies can be used to help prevent barotitis media. This information is not intended to replace advice given to you by your health care provider. Make sure you discuss any questions you have with your  health care provider. Document Released: 12/23/1999 Document Revised: 11/14/2015 Document Reviewed: 11/14/2015 Elsevier Interactive Patient Education  2017 [employer].   Tinnitus Tinnitus refers to hearing a sound when there is no actual source for that sound. This is often described as ringing in the ears. However, people with this condition may hear a variety of noises. A person may hear the sound in one ear or in both ears. The sounds of tinnitus can be soft, loud, or somewhere in between. Tinnitus can last for a few seconds or can be constant for days. It may go away without treatment and come back at various times. When tinnitus is constant or happens often, it can lead to other problems, such as trouble sleeping and trouble concentrating. Almost everyone experiences tinnitus at some point. Tinnitus that is long-lasting (chronic) or comes back often is a problem that may require medical attention. What are the causes? The cause of tinnitus is often not known. In some cases, it can result from other problems or conditions, including:  Exposure to loud noises from machinery, music, or other sources.  Hearing loss.  Ear or sinus infections.  Earwax buildup.  A foreign object in the ear.  Use of certain medicines.  Use of alcohol and caffeine.  High blood pressure.  Heart diseases.  Anemia.  Allergies.  Meniere disease.  Thyroid problems.  Tumors.  An enlarged part of a weakened blood vessel (aneurysm).  What are the signs or symptoms? The main symptom of tinnitus is hearing a sound when there is no source for that sound. It may sound like:  Buzzing.  Roaring.  Ringing.  Blowing air, similar to the sound heard when you listen to a seashell.  Hissing.  Whistling.  Sizzling.  Humming.  Running water.  A sustained musical note.  How is this diagnosed? Tinnitus is diagnosed based on your symptoms. Your health care provider will do a physical exam. A  comprehensive hearing exam (audiologic exam) will be done if your tinnitus:  Affects only one ear (unilateral).  Causes hearing difficulties.  Lasts 6 months or longer.  You may also need to see a health care provider who specializes in hearing disorders (audiologist). You may be asked to complete a questionnaire to determine the severity of your tinnitus. Tests may be done to help determine the cause and to rule out other conditions. These can include:  Imaging studies of your head and brain, such as: ? A CT scan. ? An MRI.  An imaging study of your blood vessels (angiogram).  How is this treated? Treating an underlying medical condition can sometimes make tinnitus go away. If your tinnitus continues, other treatments may include:  Medicines, such as certain antidepressants or sleeping aids.  Sound generators to mask the tinnitus. These include: ? Tabletop sound machines that play relaxing sounds to help you fall asleep. ? Wearable devices that fit in your ear and play sounds or music. ? A small device that uses headphones to deliver a signal embedded in music (acoustic neural stimulation). In time, this may change the pathways of your brain and make you  less sensitive to tinnitus. This device is used for very severe cases when no other treatment is working.  Therapy and counseling to help you manage the stress of living with tinnitus.  Using hearing aids or cochlear implants, if your tinnitus is related to hearing loss.  Follow these instructions at home:  When possible, avoid being in loud places and being exposed to loud sounds.  Wear hearing protection, such as earplugs, when you are exposed to loud noises.  Do not take stimulants, such as nicotine, alcohol, or caffeine.  Practice techniques for reducing stress, such as meditation, yoga, or deep breathing.  Use a white noise machine, a humidifier, or other devices to mask the sound of tinnitus.  Sleep with your head  slightly raised. This may reduce the impact of tinnitus.  Try to get plenty of rest each night. Contact a health care provider if:  You have tinnitus in just one ear.  Your tinnitus continues for 3 weeks or longer without stopping.  Home care measures are not helping.  You have tinnitus after a head injury.  You have tinnitus along with any of the following: ? Dizziness. ? Loss of balance. ? Nausea and vomiting. This information is not intended to replace advice given to you by your health care provider. Make sure you discuss any questions you have with your health care provider. Document Released: 12/25/2004 Document Revised: 08/28/2015 Document Reviewed: 05/27/2013 Elsevier Interactive Patient Education  2018 [employer].

## 2016-10-26 ENCOUNTER — Encounter: Payer: Self-pay | Admitting: Family Medicine

## 2016-10-26 DIAGNOSIS — H9313 Tinnitus, bilateral: Secondary | ICD-10-CM

## 2016-10-31 ENCOUNTER — Other Ambulatory Visit: Payer: Self-pay | Admitting: Family Medicine

## 2016-11-09 DIAGNOSIS — H903 Sensorineural hearing loss, bilateral: Secondary | ICD-10-CM | POA: Diagnosis not present

## 2016-11-09 DIAGNOSIS — H9203 Otalgia, bilateral: Secondary | ICD-10-CM | POA: Diagnosis not present

## 2016-11-09 DIAGNOSIS — H9313 Tinnitus, bilateral: Secondary | ICD-10-CM | POA: Diagnosis not present

## 2016-11-20 ENCOUNTER — Other Ambulatory Visit: Payer: Self-pay | Admitting: *Deleted

## 2016-11-20 MED ORDER — OMEPRAZOLE 40 MG PO CPDR: 40.0000 mg | DELAYED_RELEASE_CAPSULE | Freq: Every day | ORAL | 3 refills | Status: DC

## 2016-11-26 ENCOUNTER — Ambulatory Visit: Payer: Federal, State, Local not specified - PPO | Admitting: Family Medicine

## 2016-11-26 DIAGNOSIS — Z0189 Encounter for other specified special examinations: Secondary | ICD-10-CM

## 2016-12-10 ENCOUNTER — Inpatient Hospital Stay: Payer: Federal, State, Local not specified - PPO | Admitting: Family Medicine

## 2016-12-10 ENCOUNTER — Ambulatory Visit: Payer: Federal, State, Local not specified - PPO

## 2017-02-09 ENCOUNTER — Other Ambulatory Visit: Payer: Self-pay | Admitting: Family Medicine

## 2017-03-01 ENCOUNTER — Other Ambulatory Visit: Payer: Self-pay | Admitting: Family Medicine

## 2017-03-01 DIAGNOSIS — Z1231 Encounter for screening mammogram for malignant neoplasm of breast: Secondary | ICD-10-CM

## 2017-03-20 ENCOUNTER — Ambulatory Visit: Payer: Federal, State, Local not specified - PPO

## 2017-03-22 ENCOUNTER — Ambulatory Visit: Payer: Federal, State, Local not specified - PPO

## 2017-04-10 ENCOUNTER — Ambulatory Visit
Admission: RE | Admit: 2017-04-10 | Discharge: 2017-04-10 | Disposition: A | Discharge status: Home / Self Care | Payer: Federal, State, Local not specified - PPO | Source: Ambulatory Visit | Attending: Family Medicine | Admitting: Family Medicine

## 2017-04-10 DIAGNOSIS — Z1231 Encounter for screening mammogram for malignant neoplasm of breast: Secondary | ICD-10-CM

## 2017-10-08 ENCOUNTER — Other Ambulatory Visit: Payer: Self-pay | Admitting: Family Medicine

## 2017-10-17 ENCOUNTER — Other Ambulatory Visit: Payer: Self-pay | Admitting: Family Medicine

## 2018-01-06 ENCOUNTER — Other Ambulatory Visit: Payer: Self-pay | Admitting: Family Medicine

## 2018-02-03 ENCOUNTER — Other Ambulatory Visit: Payer: Self-pay | Admitting: Family Medicine

## 2018-02-05 ENCOUNTER — Encounter: Payer: Self-pay | Admitting: Family Medicine

## 2018-02-05 ENCOUNTER — Ambulatory Visit (INDEPENDENT_AMBULATORY_CARE_PROVIDER_SITE_OTHER): Payer: Federal, State, Local not specified - PPO | Admitting: Family Medicine

## 2018-02-05 ENCOUNTER — Other Ambulatory Visit (HOSPITAL_COMMUNITY)
Admission: RE | Admit: 2018-02-05 | Discharge: 2018-02-05 | Disposition: A | Discharge status: Home / Self Care | Payer: Federal, State, Local not specified - PPO | Source: Ambulatory Visit | Attending: Family Medicine | Admitting: Family Medicine

## 2018-02-05 VITALS — BP 130/78 | HR 87 | Ht 62.0 in | Wt 145.0 lb

## 2018-02-05 DIAGNOSIS — Z124 Encounter for screening for malignant neoplasm of cervix: Secondary | ICD-10-CM | POA: Insufficient documentation

## 2018-02-05 DIAGNOSIS — Z23 Encounter for immunization: Secondary | ICD-10-CM

## 2018-02-05 DIAGNOSIS — Z0000 Encounter for general adult medical examination without abnormal findings: Secondary | ICD-10-CM | POA: Diagnosis not present

## 2018-02-05 DIAGNOSIS — L570 Actinic keratosis: Secondary | ICD-10-CM

## 2018-02-05 DIAGNOSIS — F325 Major depressive disorder, single episode, in full remission: Secondary | ICD-10-CM

## 2018-02-05 DIAGNOSIS — Z6826 Body mass index (BMI) 26.0-26.9, adult: Secondary | ICD-10-CM | POA: Diagnosis not present

## 2018-02-05 DIAGNOSIS — E785 Hyperlipidemia, unspecified: Secondary | ICD-10-CM

## 2018-02-05 DIAGNOSIS — Z91018 Allergy to other foods: Secondary | ICD-10-CM

## 2018-02-05 DIAGNOSIS — G4700 Insomnia, unspecified: Secondary | ICD-10-CM | POA: Diagnosis not present

## 2018-02-05 DIAGNOSIS — I10 Essential (primary) hypertension: Secondary | ICD-10-CM

## 2018-02-05 DIAGNOSIS — Z1159 Encounter for screening for other viral diseases: Secondary | ICD-10-CM

## 2018-02-05 DIAGNOSIS — K582 Mixed irritable bowel syndrome: Secondary | ICD-10-CM

## 2018-02-05 MED ORDER — FLUOXETINE HCL 20 MG PO TABS: 20.0000 mg | ORAL_TABLET | Freq: Every day | ORAL | 1 refills | Status: DC

## 2018-02-05 MED ORDER — BUPROPION HCL ER (XL) 150 MG PO TB24: 150.0000 mg | ORAL_TABLET | Freq: Every day | ORAL | 1 refills | Status: DC

## 2018-02-05 MED ORDER — HYDROCHLOROTHIAZIDE 25 MG PO TABS: 25.0000 mg | ORAL_TABLET | Freq: Every day | ORAL | 1 refills | Status: DC

## 2018-02-05 MED ORDER — DICYCLOMINE HCL 20 MG PO TABS: 10.0000 mg | ORAL_TABLET | Freq: Three times a day (TID) | ORAL | 1 refills | Status: DC

## 2018-02-05 NOTE — Progress Notes (Signed)
Kristen Nichols is a 63 y.o. female who presents to [facility]: [place] today for well adult visit.   She feels well overall.  She has a few issues she would like to discuss.  She notes some scaly lesions on the left side of her face that have been present for about a year.  They are not hurting or bothering her but she is a bit worried about precancerous skin lesions.  She is not tried any treatment yet.  She does wear skin protection while outside.  Additionally she notes frequent episodes of intermittent constipation and diarrhea with cramping.  She is had evaluation in the past with a colonoscopy in 2014 that was normal aside from a few polyps.  She did have some colitis seen on a CT scan proceeding the colonoscopy.  She is not had a diagnosis yet.  She notes she is particularly sensitive to pork and beef.  She does report a history of a tick bite.  She tries to exercise regularly.  She has quit smoking.  She tries eat a careful diet.   ROS as above:  Past Medical History:  Diagnosis Date  . Allergy    Hayfever  . Benign essential hypertension 10/13/2012   Last Assessment & Plan:  Well controlled.    . Colitis   . Headache(784.0)   . Hypertension    Past Surgical History:  Procedure Laterality Date  . ANKLE SURGERY    . BREAST BIOPSY    . BREAST CYST ASPIRATION    . CESAREAN SECTION    . NASAL SINUS SURGERY     Social History   Tobacco Use  . Smoking status: Former Smoker    Packs/day: 0.50    Years: 20.00    Pack years: 10.00    Types: Cigarettes    Last attempt to quit: 01/09/2016    Years since quitting: 2.0  . Smokeless tobacco: Never Used  Substance Use Topics  . Alcohol use: Yes    Alcohol/week: 2.0 standard drinks    Types: 2 Cans of beer per week   family history includes Diabetes in her maternal grandmother; Other in her father and  mother.  Medications: Current Outpatient Medications  Medication Sig Dispense Refill  . buPROPion (WELLBUTRIN XL) 150 MG 24 hr tablet Take 1 tablet (150 mg total) by mouth daily. 90 tablet 1  . diclofenac sodium (VOLTAREN) 1 % GEL Apply 2 g topically 4 (four) times daily. To affected joint. 100 g 11  . fexofenadine (ALLEGRA) 180 MG tablet Take 180 mg by mouth daily as needed for allergies or rhinitis.    Marland Kitchen FLUoxetine (PROZAC) 20 MG tablet Take 1 tablet (20 mg total) by mouth daily. 90 tablet 1  . fluticasone (FLONASE) 50 MCG/ACT nasal spray SPRAY 2 SPRAYS INTO EACH NOSTRIL EVERY DAY 1 g 0  . hydrochlorothiazide (HYDRODIURIL) 25 MG tablet Take 1 tablet (25 mg total) by mouth daily. 90 tablet 1  . ibuprofen (ADVIL,MOTRIN) 200 MG tablet Take 600-800 mg by mouth every 6 (six) hours as needed for moderate pain.    Marland Kitchen omeprazole (PRILOSEC) 40 MG capsule Take 1 capsule (40 mg total) daily by mouth. 30 capsule 3  . dicyclomine (BENTYL) 20 MG tablet Take 0.5-1 tablets (10-20 mg total) by mouth 4 (four) times daily -  before meals and at bedtime. As needed cramps 120 tablet 1   No current facility-administered medications for this visit.  No Known Allergies  Health Maintenance Health Maintenance  Topic Date Due  . Hepatitis C Screening  1955/09/03  . TETANUS/TDAP  01/08/2010  . PAP SMEAR-Modifier  03/27/2014  . INFLUENZA VACCINE  08/08/2017  . MAMMOGRAM  04/11/2019  . COLONOSCOPY  09/20/2022  . HIV Screening  Completed     Exam:  BP 130/78   Pulse 87   Ht 5' 2"  (1.575 m)   Wt 145 lb (65.8 kg)   BMI 26.52 kg/m  Wt Readings from Last 5 Encounters:  02/05/18 145 lb (65.8 kg)  04/09/16 140 lb (63.5 kg)  02/23/16 137 lb (62.1 kg)  07/03/13 141 lb (64 kg)  07/26/10 130 lb (59 kg)      Gen: Well NAD HEENT: EOMI,  MMM Lungs: Normal work of breathing. CTABL Heart: RRR no MRG Abd: NABS, Soft. Nondistended, Nontender Exts: Brisk capillary refill, warm and well perfused.  Psych:  Alert and oriented normal speech thought process and affect.  No SI or HI expressed. Skin: 2 small patches of mild erythematous scaly skin left cheek consistent in appearance with actinic keratosis. GYN: Normal external appearing genitalia.  Normal vaginal canal.  Normal-appearing cervix.  Nontender.  Depression screen [facility] 2/9 02/05/2018 02/23/2016  Decreased Interest 0 0  Down, Depressed, Hopeless 0 0  PHQ - 2 Score 0 0  Altered sleeping 0 3  Tired, decreased energy 0 1  Change in appetite 0 0  Feeling bad or failure about yourself  0 0  Trouble concentrating 0 0  Moving slowly or fidgety/restless 0 0  Suicidal thoughts 0 0  PHQ-9 Score 0 4  Difficult doing work/chores Not difficult at all -    Treatment: Cryotherapy actinic keratosis left face x2. Consent and timeout performed.  Precautions reviewed. 2 small patches of actinic keratoses treated with liquid nitrogen to achieve Frost for 10 seconds x 3 Patient tolerated procedure well.  Assessment and Plan: 63 y.o. female with  Well adult.  Doing quite well overall.  Patient has quit smoking which is excellent. Her BMI is mildly elevated but not in the most range.  Plan to check basic fasting labs listed below.  Additionally we will proceed with cervical cancer screening via Pap smear today. Additionally administer Tdap and flu vaccines. Additionally will check hepatitis C screening for health maintenance as well.  Patient has 2 small actinic keratoses which were treated with liquid nitrogen today as well.  She notes abdominal discomfort with cramping and intermittent diarrhea and constipation.  This certainly sounds like IBS.  She is had some evaluation previously with normal colonoscopy.  Additionally she describes particular sensitivity to pork and beef after tick bite which is consistent with alpha galactose intolerance.  We will check basic labs below but also check alpha galactose panel. If normal would presume IBS and start  empiric treatment today with trial of dicyclomine.  Additionally patient may benefit from repeat colonoscopy if labs are normal.    PDMP not reviewed this encounter. Orders Placed This Encounter  Procedures  . Tdap vaccine greater than or equal to 7yo IM  . Flu Vaccine QUAD 36+ mos IM  . CBC  . COMPLETE METABOLIC PANEL WITH GFR  . Lipid Panel w/reflex Direct LDL  . Hepatitis C antibody  . Alpha-Gal Panel   Meds ordered this encounter  Medications  . dicyclomine (BENTYL) 20 MG tablet    Sig: Take 0.5-1 tablets (10-20 mg total) by mouth 4 (four) times daily -  before meals and at  bedtime. As needed cramps    Dispense:  120 tablet    Refill:  1  . buPROPion (WELLBUTRIN XL) 150 MG 24 hr tablet    Sig: Take 1 tablet (150 mg total) by mouth daily.    Dispense:  90 tablet    Refill:  1  . FLUoxetine (PROZAC) 20 MG tablet    Sig: Take 1 tablet (20 mg total) by mouth daily.    Dispense:  90 tablet    Refill:  1  . hydrochlorothiazide (HYDRODIURIL) 25 MG tablet    Sig: Take 1 tablet (25 mg total) by mouth daily.    Dispense:  90 tablet    Refill:  1     Discussed warning signs or symptoms. Please see discharge instructions. Patient expresses understanding.

## 2018-02-05 NOTE — Patient Instructions (Addendum)
Thank you for coming in today. Get fasting labs in the near future.  I will get results to you ASAP.   For stomach cramping and diarrhea take dicyclomine   We will wait on labs before repeat colonoscopy.     Actinic Keratosis An actinic keratosis is a precancerous growth on the skin. If there is more than one growth, the condition is called actinic keratoses. Actinic keratoses appear most often on areas of skin that get a lot of sun exposure, including the scalp, face, ears, lips, upper back, forearms, and the backs of the hands. If left untreated, these growths may develop into a skin cancer called squamous cell carcinoma. It is important to have all these growths checked by a health care provider to determine the best treatment approach. What are the causes? Actinic keratoses are caused by getting too much ultraviolet (UV) radiation from the sun or other UV light sources. What increases the risk? You are more likely to develop this condition if you:  Have light-colored skin and blue eyes.  Have blond or red hair.  Spend a lot of time in the sun.  Do not protect your skin from the sun when outdoors.  Are an older person. The risk of developing an actinic keratosis increases with age. What are the signs or symptoms? Actinic keratoses feel like scaly, rough spots of skin. Symptoms of this condition include growths that may:  Be as small as a pinhead or as big as a quarter.  Itch, hurt, or feel sensitive.  Be skin-colored, light tan, dark tan, pink, or a combination of any of these colors. In most cases, the growths become red.  Have a small piece of pink or gray skin (skin tag) growing from them. It may be easier to notice actinic keratoses by feeling them, rather than seeing them. Sometimes, actinic keratoses disappear, but many reappear a few days to a few weeks later. How is this diagnosed? This condition is usually diagnosed with a physical exam.  A tissue sample may be  removed from the actinic keratosis and examined under a microscope (biopsy). How is this treated? If needed, this condition may be treated by:  Scraping off the actinic keratosis (curettage).  Freezing the actinic keratosis with liquid nitrogen (cryosurgery). This causes the growth to eventually fall off the skin.  Applying medicated creams or gels to destroy the cells in the growth.  Applying chemicals to the actinic keratosis to make the outer layers of skin peel off (chemical peel).  Using photodynamic therapy. In this procedure, medicated cream is applied to the actinic keratosis. This cream increases your skin's sensitivity to light. Then, a strong light is aimed at the actinic keratosis to destroy cells in the growth. Follow these instructions at home: Skin care  Apply cool, wet cloths (cool compresses) to the affected areas.  Do not scratch your skin.  Check your skin regularly for any growths, especially growths that: ? Start to itch or bleed. ? Change in size, shape, or color. Caring for the treated area  Keep the treated area clean and dry as told by your health care provider.  Do not apply any medicine, cream, or lotion to the treated area unless your health care provider tells you to do that.  Do not pick at blisters or try to break them open. This can cause infection and scarring.  If you have red or irritated skin after treatment, follow instructions from your health care provider about how to take care  of the treated area. Make sure you: ? Wash your hands with soap and water before you change your bandage (dressing). If soap and water are not available, use hand sanitizer. ? Change your dressing as told by your health care provider.  If you have red or irritated skin after treatment, check your treated area every day for signs of infection. Check for: ? Redness, swelling, or pain. ? Fluid or blood. ? Warmth. ? Pus or a bad smell. General instructions  Take or  apply over-the-counter and prescription medicines only as told by your health care provider.  Return to your normal activities as told by your health care provider. Ask your health care provider what activities are safe for you.  Have a skin exam done every year by a health care provider who is a skin specialist (dermatologist).  Keep all follow-up visits as told by your health care provider. This is important. Lifestyle  Do not use any products that contain nicotine or tobacco, such as cigarettes and e-cigarettes. If you need help quitting, ask your health care provider.  Take steps to protect your skin from the sun. ? Try to avoid the sun between 10:00 a.m. and 4:00 p.m. This is when the UV light is the strongest. ? Use a sunscreen or sunblock with SPF 30 (sun protection factor 30) or greater. ? Apply sunscreen before you are exposed to sunlight and reapply as often as directed by the instructions on the sunscreen container. ? Always wear sunglasses that have UV protection, and always wear a hat and clothing to protect your skin from sunlight. ? When possible, avoid medicines that increase your sensitivity to sunlight. ? Do not use tanning beds or other indoor tanning devices. Contact a health care provider if:  You notice any changes or new growths on your skin.  You have swelling, pain, or more redness around your treated area.  You have fluid or blood coming from your treated area.  Your treated area feels warm to the touch.  You have pus or a bad smell coming from your treated area.  You have a fever.  You have a blister that becomes large and painful. Summary  An actinic keratosis is a precancerous growth on the skin. If there is more than one growth, the condition is called actinic keratoses. In some cases, if left untreated, these growths can develop into skin cancer.  Check your skin regularly for any growths, especially growths that start to itch or bleed, or change in  size, shape, or color.  Take steps to protect your skin from the sun.  Contact a health care provider if you notice any changes or new growths on your skin.  Keep all follow-up visits as told by your health care provider. This is important. This information is not intended to replace advice given to you by your health care provider. Make sure you discuss any questions you have with your health care provider. Document Released: 03/23/2008 Document Revised: 05/07/2017 Document Reviewed: 05/07/2017 Elsevier Interactive Patient Education  2019 [employer].

## 2018-02-06 DIAGNOSIS — G4700 Insomnia, unspecified: Secondary | ICD-10-CM | POA: Diagnosis not present

## 2018-02-06 DIAGNOSIS — Z1159 Encounter for screening for other viral diseases: Secondary | ICD-10-CM | POA: Diagnosis not present

## 2018-02-06 DIAGNOSIS — Z91018 Allergy to other foods: Secondary | ICD-10-CM | POA: Diagnosis not present

## 2018-02-06 DIAGNOSIS — L570 Actinic keratosis: Secondary | ICD-10-CM | POA: Diagnosis not present

## 2018-02-06 DIAGNOSIS — Z6826 Body mass index (BMI) 26.0-26.9, adult: Secondary | ICD-10-CM | POA: Diagnosis not present

## 2018-02-07 LAB — COMPLETE METABOLIC PANEL WITH GFR
AG Ratio: 1.9 (calc) (ref 1.0–2.5)
ALT: 27 U/L (ref 6–29)
AST: 18 U/L (ref 10–35)
Albumin: 4.4 g/dL (ref 3.6–5.1)
Alkaline phosphatase (APISO): 70 U/L (ref 33–130)
BUN: 15 mg/dL (ref 7–25)
CO2: 33 mmol/L — ABNORMAL HIGH (ref 20–32)
Calcium: 10.1 mg/dL (ref 8.6–10.4)
Chloride: 101 mmol/L (ref 98–110)
Creat: 0.82 mg/dL (ref 0.50–0.99)
GFR, Est African American: 90 mL/min/{1.73_m2} (ref >=60)
GFR, Est Non African American: 78 mL/min/{1.73_m2} (ref >=60)
Globulin: 2.3 g/dL (calc) (ref 1.9–3.7)
Glucose, Bld: 105 mg/dL — ABNORMAL HIGH (ref 65–99)
POTASSIUM: 4.2 mmol/L (ref 3.5–5.3)
Sodium: 142 mmol/L (ref 135–146)
Total Bilirubin: 0.5 mg/dL (ref 0.2–1.2)
Total Protein: 6.7 g/dL (ref 6.1–8.1)

## 2018-02-07 LAB — CBC
HCT: 44.1 % (ref 35.0–45.0)
Hemoglobin: 15.2 g/dL (ref 11.7–15.5)
MCH: 31.3 pg (ref 27.0–33.0)
MCHC: 34.5 g/dL (ref 32.0–36.0)
MCV: 90.9 fL (ref 80.0–100.0)
MPV: 10.5 fL (ref 7.5–12.5)
Platelets: 295 10*3/uL (ref 140–400)
RBC: 4.85 10*6/uL (ref 3.80–5.10)
RDW: 11.6 % (ref 11.0–15.0)
WBC: 6.7 10*3/uL (ref 3.8–10.8)

## 2018-02-07 LAB — LIPID PANEL W/REFLEX DIRECT LDL
Cholesterol: 173 mg/dL (ref <200)
HDL: 55 mg/dL (ref >50)
LDL Cholesterol (Calc): 98 mg/dL (calc)
Non-HDL Cholesterol (Calc): 118 mg/dL (calc) (ref <130)
TRIGLYCERIDES: 105 mg/dL (ref <150)
Total CHOL/HDL Ratio: 3.1 (calc) (ref <5.0)

## 2018-02-07 LAB — HEPATITIS C ANTIBODY
HEP C AB: NONREACTIVE
SIGNAL TO CUT-OFF: 0.01 (ref <1.00)

## 2018-02-07 LAB — CYTOLOGY - PAP
Diagnosis: NEGATIVE
HPV: NOT DETECTED

## 2018-02-11 LAB — ALPHA-GAL PANEL
Beef IgE: <0.1 kU/L (ref <0.35)
CLASS: 0
Class: 0
Class: 0
Galactose-alpha-1,3-galactose IgE: <0.1 kU/L (ref <0.10)
LAMB/MUTTON IGE: <0.1 kU/L (ref <0.35)
Pork IgE: <0.1 kU/L (ref <0.35)

## 2018-02-12 ENCOUNTER — Telehealth: Payer: Self-pay | Admitting: Family Medicine

## 2018-02-12 DIAGNOSIS — K582 Mixed irritable bowel syndrome: Secondary | ICD-10-CM

## 2018-02-12 NOTE — Telephone Encounter (Signed)
Referral to gastroenterology placed.  Sent to [employer] office.

## 2018-02-12 NOTE — Telephone Encounter (Signed)
-----   Message from Lavon Paganini, LPN sent at 08/12/7306  2:43 PM EST ----- Pt advised of results, verbalized understanding.   Questions if PCP wants to proceed with colonoscopy referral based on lab results. She has been to [facility] before and was not happy with them. OK to go to [facility] or [place]. Routing.

## 2018-02-13 NOTE — Telephone Encounter (Signed)
Pt advised.

## 2018-02-19 ENCOUNTER — Ambulatory Visit: Payer: Federal, State, Local not specified - PPO | Admitting: Gastroenterology

## 2018-02-19 ENCOUNTER — Encounter: Payer: Self-pay | Admitting: Gastroenterology

## 2018-02-19 VITALS — BP 110/82 | HR 86 | Ht 62.5 in | Wt 145.0 lb

## 2018-02-19 DIAGNOSIS — R1031 Right lower quadrant pain: Secondary | ICD-10-CM

## 2018-02-19 DIAGNOSIS — R194 Change in bowel habit: Secondary | ICD-10-CM | POA: Diagnosis not present

## 2018-02-19 DIAGNOSIS — K219 Gastro-esophageal reflux disease without esophagitis: Secondary | ICD-10-CM | POA: Diagnosis not present

## 2018-02-19 DIAGNOSIS — R110 Nausea: Secondary | ICD-10-CM

## 2018-02-19 MED ORDER — SOD PICOSULFATE-MAG OX-CIT ACD 10-3.5-12 MG-GM -GM/160ML PO SOLN: 1.0000 | ORAL | 0 refills | Status: DC

## 2018-02-19 NOTE — Progress Notes (Addendum)
Chief Complaint: Alternating bowel habits, abdominal pain, reflux  Referring Provider:   Gregor Hams, MD  HPI:    Kristen Nichols is a 63 y.o. female referred to [facility] for evaluation of multiple GI issues, to include alternating bowel habits, abdominal pain, reflux symptoms.  She describes long standing history of variable bowel habits, described as alternating constipation and diarrhea and abdominal cramping.  Was seen by her PCM for this issue on 02/05/2018 and started on trial of dicyclomine (has not yet started) and referred to GI for further evaluation and colonoscopy.  Describes predominantly constipation with 1 BM every 2 to 3 days for as long as she can member, with intermittent episodes of watery, nonbloody stools.  She was previously prescribed MiraLAX in 2018 (see below), but unclear if this made any difference.  Otherwise has not trialed any other medications.    Abdominal pain described as discomfort in RLQ, without radiation.  Pain characterized as a  "knot" or spasm.  Pain occurs indpendently and without exacerbating/alleviating factors. Occurs at random. Not relieved with BM. Has trialed any medications for this.  Has stopped nuts, popcorn, seeds, etc because hx of diverticulosis, but not necessarily b/c these induce sxs.   Separately, she endorses heartburn and regurgitation. More often at night. Worse with constipation as well. No dysphagia or odynophagia. Takes Tums or omeprazole prn. Will have nausea if taking omeprazole daily, so she only takes intermittently.  No previous EGD.  Was seen by [facility] in 04/2016 for hospital follow-up.  She was hospitalized in 02/2016 with abdominal pain and diarrhea with CT notable for colitis in the distal transverse colon, splenic flexure, and left colon.  C. difficile was negative.  Was treated with IV Cipro/Flagyl with resolution of symptoms.  She then reverted back to constipation,  which was her baseline for many years.  Diagnosed with IBS-C and treated with MiraLAX and Benefiber.  Recent labs notable for normal CBC, CMP.  No recent abdominal imaging for review.  Endoscopic history: -Colonoscopy (2014, Dr. Harl Bowie at [facility]): 5 mm polyp in cecum and 3 mm polyp in transverse colon (path polypoid colonic mucosa with benign reactive lymphoid hyperplasia), normal TI, sigmoid diverticulosis -Colonoscopy (2012, [place]): No report available for review.  Patient reports polyps  No known family history of CRC, GI malignancy, liver disease, pancreatic disease, or IBD.   Past Medical History:  Diagnosis Date  . Allergy    Hayfever  . Anxiety   . Benign essential hypertension 10/13/2012   Last Assessment & Plan:  Well controlled.    . Colitis   . Diverticulosis   . GERD (gastroesophageal reflux disease)   . Headache(784.0)   . History of colon polyps   . Hypertension   . IBS (irritable bowel syndrome)   . UC (ulcerative colitis) [facility])      Past Surgical History:  Procedure Laterality Date  . ANKLE SURGERY    . BREAST BIOPSY    . BREAST CYST ASPIRATION    . CESAREAN SECTION    . COLONOSCOPY     has had 2 or 3 of them per patient. Last one was around 2016 with [facility] or maybe [facility]   . NASAL SINUS SURGERY     Family History  Problem Relation Age of Onset  . Other Mother   . Other Father   . Diabetes Maternal Grandmother   . Colon  cancer Neg Hx   . Esophageal cancer Neg Hx    Social History   Tobacco Use  . Smoking status: Former Smoker    Packs/day: 0.50    Years: 20.00    Pack years: 10.00    Types: Cigarettes    Last attempt to quit: 01/09/2016    Years since quitting: 2.1  . Smokeless tobacco: Never Used  Substance Use Topics  . Alcohol use: Yes    Alcohol/week: 2.0 standard drinks    Types: 2 Cans of beer per week  . Drug use: No   Current Outpatient Medications  Medication Sig Dispense Refill  . buPROPion  (WELLBUTRIN XL) 150 MG 24 hr tablet Take 1 tablet (150 mg total) by mouth daily. 90 tablet 1  . diclofenac sodium (VOLTAREN) 1 % GEL Apply 2 g topically 4 (four) times daily. To affected joint. (Patient taking differently: Apply 2 g topically as needed. To affected joint.) 100 g 11  . dicyclomine (BENTYL) 20 MG tablet Take 0.5-1 tablets (10-20 mg total) by mouth 4 (four) times daily -  before meals and at bedtime. As needed cramps 120 tablet 1  . fexofenadine (ALLEGRA) 180 MG tablet Take 180 mg by mouth daily as needed for allergies or rhinitis.    Marland Kitchen FLUoxetine (PROZAC) 20 MG tablet Take 1 tablet (20 mg total) by mouth daily. 90 tablet 1  . fluticasone (FLONASE) 50 MCG/ACT nasal spray SPRAY 2 SPRAYS INTO EACH NOSTRIL EVERY DAY (Patient taking differently: as needed. ) 1 g 0  . hydrochlorothiazide (HYDRODIURIL) 25 MG tablet Take 1 tablet (25 mg total) by mouth daily. 90 tablet 1  . ibuprofen (ADVIL,MOTRIN) 200 MG tablet Take 600-800 mg by mouth every 6 (six) hours as needed for moderate pain.    Marland Kitchen omeprazole (PRILOSEC) 40 MG capsule Take 1 capsule (40 mg total) daily by mouth. (Patient not taking: Reported on 02/19/2018) 30 capsule 3   No current facility-administered medications for this visit.    No Known Allergies   Review of Systems: All systems reviewed and negative except where noted in HPI.     Physical Exam:    Wt Readings from Last 3 Encounters:  02/19/18 145 lb (65.8 kg)  02/05/18 145 lb (65.8 kg)  04/09/16 140 lb (63.5 kg)    BP 110/82   Pulse 86   Ht 5' 2.5" (1.588 m)   Wt 145 lb (65.8 kg)   BMI 26.10 kg/m  Constitutional:  Pleasant, in no acute distress. Psychiatric: Normal mood and affect. Behavior is normal. EENT: Pupils normal.  Conjunctivae are normal. No scleral icterus. Neck supple. No cervical LAD. Cardiovascular: Normal rate, regular rhythm. No edema Pulmonary/chest: Effort normal and breath sounds normal. No wheezing, rales or rhonchi. Abdominal: TTP and  RLQ without rebound or guarding.   +Carnett's sign, but in broad distribution.  Mild TTP in LLQ.  Soft, nondistended. Bowel sounds active throughout. There are no masses palpable. No hepatomegaly. Neurological: Alert and oriented to person place and time. Skin: Skin is warm and dry. No rashes noted.   ASSESSMENT AND PLAN;   VINCENZINA JAGODA is a 63 y.o. female presenting with:  1) Alternating bowel habits: Clinical presentation seems most consistent with underlying constipation (IBS-C) with overflow diarrhea.  We will plan on evaluation and treatment as below:  - Colonoscopy to evaluate for luminal/mucosal etiology.  Plan for random and directed mucosal biopsies -Given diarrhea component, plan for duodenal biopsies at time of EGD as below - Provided handout  on low FODMAP diet with detailed explanation - Ensure intake of at least 8 glasses of 8 ounces of fluid/water per day - Encouraged to keep a diet diary - Increase dietary fiber and add fiber supplement (i.e. Citrucel, Benefiber, Metamucil) for goal of 25 g of fiber per day with goal for soft, formed stools without straining to have a BM - Discussed trial of laxative agent (i.e. MiraLAX), stool softeners with goal of short-term use as a bridge to sustained response.  She would like to hold off on new medications pending endoscopic evaluation.   - If no clinical improvement with the above, discussed adding prucalopride, linaclotide, lubiprostone, or plecanatide in IBS-C.  She is interested in these options, but would also like to hold off until following endoscopic eval - Start Bentyl as prescribed for abdominal pain/cramping - Can discuss neuromodulation with TCA, SSRI pending response to therapy - Discussed the medical benefits of yoga and regular exercise in all IBS patients  2) Abdominal pain: Suspect secondary to the above IBS.  Treat abdominal pain with Bentyl as previously prescribed by PCM.  Will evaluate for clinical response to  trial of medications along with dietary/fiber modifications as outlined above.  - Additionally, will evaluate for mucosal/luminal etiology at time of colonoscopy along with EGD with random and directed biopsies.  3) GERD: History of chronic reflux symptoms which respond to prn medications.  Discussed a trial of high-dose acid suppression therapy for diagnostic/therapeutic intent.  Given her previous intolerance to daily PPI therapy, she would like to evaluate for erosive esophagitis, LES laxity, hiatal hernia, etc. as further objective evidence to proceed with a daily medication instead of diagnostic trial.  - EGD -Resume antireflux lifestyle measures -Resume antacid medications prn as currently doing  4) Nausea without vomiting: Intermittent nausea without emesis.  Evaluate for gastritis, PUD, serotonin EGD as above.  Does not want to start trial of antiemetics.  RTC in 3 6 months or sooner as needed  The indications, risks, and benefits of EGD and colonoscopy were explained to the patient in detail. Risks include but are not limited to bleeding, perforation, adverse reaction to medications, and cardiopulmonary compromise. Sequelae include but are not limited to the possibility of surgery, hositalization, and mortality. The patient verbalized understanding and wished to proceed. All questions answered, referred to scheduler and bowel prep ordered. Further recommendations pending results of the exam.     Lavena Bullion, DO, FACG  02/19/2018, 8:53 AM   Gregor Hams, MD

## 2018-02-19 NOTE — Patient Instructions (Signed)
If you are age 61 or older, your body mass index should be between 23-30. Your Body mass index is 26.1 kg/m. If this is out of the aforementioned range listed, please consider follow up with your Primary Care Provider.  If you are age 63 or younger, your body mass index should be between 19-25. Your Body mass index is 26.1 kg/m. If this is out of the aformentioned range listed, please consider follow up with your Primary Care Provider.   Start taking your Bentyl.  Please start taking a daily fiber supplement such as citrucel, benefiber, metamucil, or fiber choice.   Please call our office at [phone] to set up your 3-6 month follow up visit.   Fiber Chart  You should be consuming 25-30g of fiber per day and drinking 8 glasses of water to help your bowels move regularly.  In the chart below you can look up how much fiber you are getting in an average day.  If you are not getting enough fiber, you should add a fiber supplement to your diet.  Examples of this include Metamucil, FiberCon and Citrucel.  These can be purchased at your local grocery store or pharmacy.      [email or web address]  It was a pleasure to see you today!  Vito Cirigliano, D.O.

## 2018-02-26 ENCOUNTER — Encounter: Payer: Self-pay | Admitting: Gastroenterology

## 2018-02-26 ENCOUNTER — Ambulatory Visit (AMBULATORY_SURGERY_CENTER): Payer: Federal, State, Local not specified - PPO | Admitting: Gastroenterology

## 2018-02-26 VITALS — BP 121/75 | HR 70 | Temp 98.4°F | Resp 14 | Ht 62.0 in | Wt 145.0 lb

## 2018-02-26 DIAGNOSIS — R194 Change in bowel habit: Secondary | ICD-10-CM

## 2018-02-26 DIAGNOSIS — K635 Polyp of colon: Secondary | ICD-10-CM

## 2018-02-26 DIAGNOSIS — K219 Gastro-esophageal reflux disease without esophagitis: Secondary | ICD-10-CM | POA: Diagnosis not present

## 2018-02-26 DIAGNOSIS — K449 Diaphragmatic hernia without obstruction or gangrene: Secondary | ICD-10-CM

## 2018-02-26 DIAGNOSIS — K2100 Gastro-esophageal reflux disease with esophagitis, without bleeding: Secondary | ICD-10-CM

## 2018-02-26 DIAGNOSIS — D124 Benign neoplasm of descending colon: Secondary | ICD-10-CM

## 2018-02-26 DIAGNOSIS — D125 Benign neoplasm of sigmoid colon: Secondary | ICD-10-CM

## 2018-02-26 MED ORDER — OMEPRAZOLE 20 MG PO CPDR: DELAYED_RELEASE_CAPSULE | ORAL | 0 refills | Status: DC

## 2018-02-26 MED ORDER — SODIUM CHLORIDE 0.9 % IV SOLN: 500.0000 mL | Freq: Once | INTRAVENOUS | Status: DC

## 2018-02-26 NOTE — Patient Instructions (Addendum)
Omeprazole order sent to your pharmacy [facility] [address]  Await biopsy results  Return to GI clinic in 1 month   Information on reflux & hiatal hernia given to you today  Colonoscopy - information on polyps given to you today -await pathology on polyps removed and on biopsies done    YOU HAD AN ENDOSCOPIC PROCEDURE TODAY AT [place]:   Refer to the procedure report that was given to you for any specific questions about what was found during the examination.  If the procedure report does not answer your questions, please call your gastroenterologist to clarify.  If you requested that your care partner not be given the details of your procedure findings, then the procedure report has been included in a sealed envelope for you to review at your convenience later.  YOU SHOULD EXPECT: Some feelings of bloating in the abdomen. Passage of more gas than usual.  Walking can help get rid of the air that was put into your GI tract during the procedure and reduce the bloating. If you had a lower endoscopy (such as a colonoscopy or flexible sigmoidoscopy) you may notice spotting of blood in your stool or on the toilet paper. If you underwent a bowel prep for your procedure, you may not have a normal bowel movement for a few days.  Please Note:  You might notice some irritation and congestion in your nose or some drainage.  This is from the oxygen used during your procedure.  There is no need for concern and it should clear up in a day or so.  SYMPTOMS TO REPORT IMMEDIATELY:   Following lower endoscopy (colonoscopy or flexible sigmoidoscopy):  Excessive amounts of blood in the stool  Significant tenderness or worsening of abdominal pains  Swelling of the abdomen that is new, acute  Fever of 100F or higher   Following upper endoscopy (EGD)  Vomiting of blood or coffee ground material  New chest pain or pain under the shoulder blades  Painful or persistently  difficult swallowing  New shortness of breath  Fever of 100F or higher  Black, tarry-looking stools  For urgent or emergent issues, a gastroenterologist can be reached at any hour by calling [phone].   DIET:  We do recommend a small meal at first, but then you may proceed to your regular diet.  Drink plenty of fluids but you should avoid alcoholic beverages for 24 hours.  ACTIVITY:  You should plan to take it easy for the rest of today and you should NOT DRIVE or use heavy machinery until tomorrow (because of the sedation medicines used during the test).    FOLLOW UP: Our staff will call the number listed on your records the next business day following your procedure to check on you and address any questions or concerns that you may have regarding the information given to you following your procedure. If we do not reach you, we will leave a message.  However, if you are feeling well and you are not experiencing any problems, there is no need to return our call.  We will assume that you have returned to your regular daily activities without incident.  If any biopsies were taken you will be contacted by phone or by letter within the next 1-3 weeks.  Please call us at [phone] if you have not heard about the biopsies in 3 weeks.    SIGNATURES/CONFIDENTIALITY: You and/or your care partner have signed paperwork which will  be entered into your electronic medical record.  These signatures attest to the fact that that the information above on your After Visit Summary has been reviewed and is understood.  Full responsibility of the confidentiality of this discharge information lies with you and/or your care-partner.

## 2018-02-26 NOTE — Progress Notes (Signed)
Called to room to assist during endoscopic procedure.  Patient ID and intended procedure confirmed with present staff. Received instructions for my participation in the procedure from the performing physician.  

## 2018-02-26 NOTE — Progress Notes (Signed)
Pt's states no medical or surgical changes since previsit or office visit. 

## 2018-02-26 NOTE — Op Note (Signed)
[place] Patient Name: Latricia Cerrito Procedure Date: 02/26/2018 2:15 PM MRN: [id number] Endoscopist: Gerrit Heck , MD Age: 63 Referring MD:  Date of Birth: 03-17-1955 Gender: Female Account #: [id number] Procedure:                Upper GI endoscopy Indications:              Abdominal pain in the right lower quadrant,                            Heartburn, Suspected esophageal reflux, Diarrhea                            (alternating bowel habits), Nausea Medicines:                Monitored Anesthesia Care Procedure:                Pre-Anesthesia Assessment:                           - Prior to the procedure, a History and Physical                            was performed, and patient medications and                            allergies were reviewed. The patient's tolerance of                            previous anesthesia was also reviewed. The risks                            and benefits of the procedure and the sedation                            options and risks were discussed with the patient.                            All questions were answered, and informed consent                            was obtained. Prior Anticoagulants: The patient has                            taken no previous anticoagulant or antiplatelet                            agents. ASA Grade Assessment: II - A patient with                            mild systemic disease. After reviewing the risks                            and benefits, the patient was deemed in  satisfactory condition to undergo the procedure.                           After obtaining informed consent, the endoscope was                            passed under direct vision. Throughout the                            procedure, the patient's blood pressure, pulse, and                            oxygen saturations were monitored continuously. The                            Endoscope was  introduced through the mouth, and                            advanced to the second part of duodenum. The upper                            GI endoscopy was accomplished without difficulty.                            The patient tolerated the procedure well. Scope In: Scope Out: Findings:                 Esophagogastric landmarks were identified: the                            Z-line was found at 36 cm, the gastroesophageal                            junction was found at 36 cm and the site of hiatal                            narrowing was found at 37 cm from the incisors.                           A 1 cm hiatal hernia was present.                           LA Grade A (one or more mucosal breaks less than 5                            mm, not extending between tops of 2 mucosal folds)                            esophagitis with no bleeding was found 36 cm from                            the incisors.  The gastroesophageal flap valve was visualized                            endoscopically and classified as Hill Grade II                            (fold present, opens with respiration).                           The upper third of the esophagus and middle third                            of the esophagus were normal.                           The entire examined stomach was normal. Biopsies                            were taken with a cold forceps for Helicobacter                            pylori testing. Estimated blood loss was minimal.                           The duodenal bulb, first portion of the duodenum                            and second portion of the duodenum were normal.                            Biopsies for histology were taken with a cold                            forceps for evaluation of celiac disease. Complications:            No immediate complications. Estimated Blood Loss:     Estimated blood loss was minimal. Impression:               -  Esophagogastric landmarks identified.                           - 1 cm hiatal hernia.                           - LA Grade A reflux esophagitis.                           - Gastroesophageal flap valve classified as Hill                            Grade II (fold present, opens with respiration).                           - Normal upper third of esophagus and middle third  of esophagus.                           - Normal stomach. Biopsied.                           - Normal duodenal bulb, first portion of the                            duodenum and second portion of the duodenum.                            Biopsied. Recommendation:           - Patient has a contact number available for                            emergencies. The signs and symptoms of potential                            delayed complications were discussed with the                            patient. Return to normal activities tomorrow.                            Written discharge instructions were provided to the                            patient.                           - Resume previous diet today.                           - Continue present medications.                           - Await pathology results.                           - Use Prilosec (omeprazole) 20 mg PO BID for 8                            weeks, then will reduce to the lowest effective                            dose to control reflux symptoms.                           - Return to GI clinic in 1 month.                           - If you would like to discuss endoscopic treatment                            of  your acid reflux with Transoral Incisionless                            Fundoplication (TIF), we can further discuss this                            option at your follow-up appointment. Gerrit Heck, MD 02/26/2018 3:21:57 PM

## 2018-02-26 NOTE — Op Note (Signed)
[facility] Patient Name: Kristen Nichols Procedure Date: 02/26/2018 2:15 PM MRN: [id number] Endoscopist: Gerrit Heck , MD Age: 63 Referring MD:  Date of Birth: 12/12/1955 Gender: Female Account #: [id number] Procedure:                Colonoscopy Indications:              Abdominal pain in the right lower quadrant, Change                            in bowel habits, Constipation Medicines:                Monitored Anesthesia Care Procedure:                Pre-Anesthesia Assessment:                           - Prior to the procedure, a History and Physical                            was performed, and patient medications and                            allergies were reviewed. The patient's tolerance of                            previous anesthesia was also reviewed. The risks                            and benefits of the procedure and the sedation                            options and risks were discussed with the patient.                            All questions were answered, and informed consent                            was obtained. Prior Anticoagulants: The patient has                            taken no previous anticoagulant or antiplatelet                            agents. ASA Grade Assessment: II - A patient with                            mild systemic disease. After reviewing the risks                            and benefits, the patient was deemed in                            satisfactory condition to undergo the procedure.  After obtaining informed consent, the colonoscope                            was passed under direct vision. Throughout the                            procedure, the patient's blood pressure, pulse, and                            oxygen saturations were monitored continuously. The                            Colonoscope was introduced through the anus and                            advanced to the the cecum,  identified by                            appendiceal orifice and ileocecal valve. The                            colonoscopy was performed without difficulty. The                            patient tolerated the procedure well. The quality                            of the bowel preparation was adequate. The                            ileocecal valve, appendiceal orifice, and rectum                            were photographed. Scope In: 2:45:43 PM Scope Out: 3:07:48 PM Scope Withdrawal Time: 0 hours 17 minutes 38 seconds  Total Procedure Duration: 0 hours 22 minutes 5 seconds  Findings:                 Skin tags were found on perianal exam.                           Three sessile polyps were found in the sigmoid                            colon and descending colon. The polyps were 2 to 3                            mm in size. These polyps were removed with a cold                            biopsy forceps. Resection and retrieval were                            complete. Estimated blood loss was minimal.  A few small-mouthed diverticula were found in the                            sigmoid colon.                           Normal mucosa was found in the entire colon.                            Biopsies for histology were taken with a cold                            forceps from the right colon and left colon for                            evaluation of microscopic colitis. Estimated blood                            loss was minimal.                           Anal papilla(e) were hypertrophied. Complications:            No immediate complications. Estimated Blood Loss:     Estimated blood loss was minimal. Impression:               - Perianal skin tags found on perianal exam.                           - Three 2 to 3 mm polyps in the sigmoid colon and                            in the descending colon, removed with a cold biopsy                            forceps.  Resected and retrieved.                           - Diverticulosis in the sigmoid colon.                           - Normal mucosa in the entire examined colon.                            Biopsied.                           - Anal papilla(e) were hypertrophied. Recommendation:           - Patient has a contact number available for                            emergencies. The signs and symptoms of potential                            delayed complications were discussed  with the                            patient. Return to normal activities tomorrow.                            Written discharge instructions were provided to the                            patient.                           - Resume previous diet today.                           - Continue present medications.                           - Await pathology results.                           - Repeat colonoscopy in 7-10 years for surveillance                            based on pathology results.                           - Return to GI office. Gerrit Heck, MD 02/26/2018 3:27:10 PM

## 2018-02-27 ENCOUNTER — Telehealth: Payer: Self-pay | Admitting: *Deleted

## 2018-02-27 NOTE — Telephone Encounter (Signed)
  Follow up Call-  Call back number 02/26/2018  Post procedure Call Back phone  # [phone]  Permission to leave phone message Yes  Some recent data might be hidden     Patient questions:  Do you have a fever, pain , or abdominal swelling? Yes.   Pain Score  6 *  Have you tolerated food without any problems? Yes.    Have you been able to return to your normal activities? Yes.    Do you have any questions about your discharge instructions: Diet   No. Medications  No. Follow up visit  No.  Do you have questions or concerns about your Care? No.  Actions: * If pain score is 4 or above: Physician/ provider Notified : Gerrit Heck, DO-notified that pt c/o pain (6 on pain scale ) right side bottom of throat. Pt is taking Ibuprofen for this and trying to drink as much as she can to keep throat moist..

## 2018-02-27 NOTE — Telephone Encounter (Signed)
No dilation or esophageal bxs performed. Some throat soreness after an EGD is

## 2018-02-27 NOTE — Telephone Encounter (Signed)
No dilation or esophageal biopsies performed. Some throat soreness after an EGD is not uncommon and I agree with supportive care and observation for now. If sxs persist, can consider Rx for Carafate or viscous Lidocaine. Otherwise, continue fluids/PO intake as tolerated and to call if fever, chills, increasing pain, or other concerns. Thank you.

## 2018-02-27 NOTE — Telephone Encounter (Signed)
Called pt to back to let her know to keep drinking and treat symptoms of sore throat after EGD.pt says she ids taking Ibuprofen and drinking liquids and will call back if pain does not improve or symptoms worsen or runs fever ,etc.

## 2018-02-28 ENCOUNTER — Other Ambulatory Visit: Payer: Self-pay | Admitting: Family Medicine

## 2018-02-28 DIAGNOSIS — K582 Mixed irritable bowel syndrome: Secondary | ICD-10-CM

## 2018-03-03 ENCOUNTER — Telehealth: Payer: Self-pay

## 2018-03-03 NOTE — Telephone Encounter (Signed)
Patient returned call to office- patient scheduled for 4 week f/u appt post procedure per MD recommendations; Patient was advised to call back if questions/concerns arise; Patient verbalized understanding of information/instructions;

## 2018-03-03 NOTE — Telephone Encounter (Signed)
Left message for patient to call back  

## 2018-03-06 ENCOUNTER — Encounter: Payer: Self-pay | Admitting: Gastroenterology

## 2018-03-31 ENCOUNTER — Other Ambulatory Visit: Payer: Self-pay | Admitting: Family Medicine

## 2018-03-31 DIAGNOSIS — K582 Mixed irritable bowel syndrome: Secondary | ICD-10-CM

## 2018-04-01 ENCOUNTER — Telehealth (INDEPENDENT_AMBULATORY_CARE_PROVIDER_SITE_OTHER): Payer: Federal, State, Local not specified - PPO | Admitting: Gastroenterology

## 2018-04-01 ENCOUNTER — Other Ambulatory Visit: Payer: Self-pay | Admitting: Family Medicine

## 2018-04-01 ENCOUNTER — Other Ambulatory Visit: Payer: Self-pay

## 2018-04-01 DIAGNOSIS — K2100 Gastro-esophageal reflux disease with esophagitis, without bleeding: Secondary | ICD-10-CM

## 2018-04-01 DIAGNOSIS — K449 Diaphragmatic hernia without obstruction or gangrene: Secondary | ICD-10-CM

## 2018-04-01 DIAGNOSIS — K5730 Diverticulosis of large intestine without perforation or abscess without bleeding: Secondary | ICD-10-CM | POA: Diagnosis not present

## 2018-04-01 DIAGNOSIS — K581 Irritable bowel syndrome with constipation: Secondary | ICD-10-CM

## 2018-04-01 MED ORDER — PANTOPRAZOLE SODIUM 40 MG PO TBEC: 40.0000 mg | DELAYED_RELEASE_TABLET | ORAL | 0 refills | Status: DC

## 2018-04-01 MED ORDER — PANTOPRAZOLE SODIUM 40 MG PO TBEC: 40.0000 mg | DELAYED_RELEASE_TABLET | Freq: Every day | ORAL | 3 refills | Status: DC

## 2018-04-01 NOTE — Progress Notes (Signed)
Chief Complaint:   GI History: 63 year old female initially seen by me on 02/19/2022 alternating bowel habits, abdominal pain, and reflux symptoms.  Longstanding history of constipation with 1 BM every 2 to 3 days for as long as she can remember, with intermittent episodes of watery, nonbloody stools.  Abdominal pain described as discomfort in RLQ, without radiation.  Pain characterized as a  "knot" or spasm.  Pain occurs indpendently and without exacerbating/alleviating factors. Occurs at random. Not relieved with BM.  History of heartburn and regurgitation. More often at night. Worse with constipation as well. No dysphagia or odynophagia. Takes Tums or omeprazole prn. Will have nausea if taking omeprazole daily, so she only takes intermittently.   EGD in 02/2018 with LA grade A esophagitis, 1 cm hiatal hernia with a Hill grade 2 valve.  Prescribed Prilosec 20 mg p.o. twice daily x8 weeks with plan for discussion of TIF.  Previously was seen by [facility] in 04/2016 for hospital follow-up.  She was hospitalized in 02/2016 with abdominal pain and diarrhea with CT notable for colitis in the distal transverse colon, splenic flexure, and left colon.  C. difficile was negative.  Was treated with IV Cipro/Flagyl with resolution of symptoms.  She then reverted back to constipation, which was her baseline for many years.  Diagnosed with IBS-C and treated with MiraLAX and Benefiber.  Endoscopic history: -EGD (2020, Dr. Bryan Lemma): 1 cm hiatal hernia, LA grade A esophagitis, Hill grade 2 valve, normal stomach and duodenum. -Colonoscopy (2020, Dr. Bryan Lemma): 3 small, 2 to 3 mm, Hyperplastic polyps, sigmoid diverticulosis, otherwise normal with biopsies negative for [facility].  Recommended repeat in 10 years. -Colonoscopy (2014, Dr. Harl Bowie at [facility]): 5 mm polyp in cecum and 3 mm polyp in transverse colon (path polypoid colonic mucosa with benign reactive lymphoid  hyperplasia), normal TI, sigmoid diverticulosis -Colonoscopy (2012, [place]): No report available for review.  Patient reports polyps   HPI:    Due to current restrictions/limitations of in office visits due to COVID-19, this scheduled clinical appointment was converted to a telehealth consultation via telephone.  -Time of medical discussion: 23 minutes -Patient consented to the consult via telephone -Names of all parties present: Lanae Crumbly (patient), Gerrit Heck, DO, [facility] (physician)  KENITRA LEVENTHAL is a 63 y.o. female referred to the Gastroenterology Clinic for follow-up.  Was initially seen by me on 02/19/2022 IBS-C and reflux.  At that time, recommended increase dietary fiber and add fiber supplement, and trial of Bentyl.  She held off on a trial of MiraLAX.  Colonoscopy was largely unrevealing (small hyperplastic polyps and diverticulosis).  Additionally, EGD performed for reflux which was notable for LA grade A esophagitis and a very small 1 cm hiatal hernia.  Increased PPI to bid.  Today she states she feels well and much improved with abdominal pain and constipation. Some improvement with increasing to BID PPI, but still with breakthrough reflux sxs with abruptly restarted on 03/17/18. Increased belch, regurgitation, but improved HB. Takes Tums prn.   Past medical history, past surgical history, social history, family history, medications, and allergies reviewed in the chart and with patient over the phone.  Past Medical History:  Diagnosis Date  . Allergy    Hayfever  . Anxiety   . Benign essential hypertension 10/13/2012   Last Assessment & Plan:  Well controlled.    . Colitis   . Diverticulosis   .  GERD (gastroesophageal reflux disease)   . Headache(784.0)   . History of colon polyps   . Hypertension   . IBS (irritable bowel syndrome)   . UC (ulcerative colitis) [facility])      Past Surgical History:  Procedure Laterality Date  . ANKLE SURGERY    . BREAST  BIOPSY    . BREAST CYST ASPIRATION    . CESAREAN SECTION    . COLONOSCOPY     has had 2 or 3 of them per patient. Last one was around 2016 with [facility] or maybe [facility]   . NASAL SINUS SURGERY     Family History  Problem Relation Age of Onset  . Other Mother   . Other Father   . Diabetes Maternal Grandmother   . Colon cancer Neg Hx   . Esophageal cancer Neg Hx    Social History   Tobacco Use  . Smoking status: Former Smoker    Packs/day: 0.50    Years: 20.00    Pack years: 10.00    Types: Cigarettes    Last attempt to quit: 01/09/2016    Years since quitting: 2.2  . Smokeless tobacco: Never Used  Substance Use Topics  . Alcohol use: Yes    Alcohol/week: 2.0 standard drinks    Types: 2 Cans of beer per week  . Drug use: No   Current Outpatient Medications  Medication Sig Dispense Refill  . buPROPion (WELLBUTRIN XL) 150 MG 24 hr tablet Take 1 tablet (150 mg total) by mouth daily. 90 tablet 1  . dicyclomine (BENTYL) 20 MG tablet TAKE 0.5-1 TABLETS (10-20 MG TOTAL) BY MOUTH 4 (FOUR) TIMES DAILY - BEFORE MEALS AND AT BEDTIME. AS NEEDED CRAMPS 120 tablet 1  . fexofenadine (ALLEGRA) 180 MG tablet Take 180 mg by mouth daily as needed for allergies or rhinitis.    Marland Kitchen FLUoxetine (PROZAC) 20 MG tablet Take 1 tablet (20 mg total) by mouth daily. 90 tablet 1  . fluticasone (FLONASE) 50 MCG/ACT nasal spray SPRAY 2 SPRAYS INTO EACH NOSTRIL EVERY DAY 16 g 0  . hydrochlorothiazide (HYDRODIURIL) 25 MG tablet Take 1 tablet (25 mg total) by mouth daily. 90 tablet 1  . ibuprofen (ADVIL,MOTRIN) 200 MG tablet Take 600-800 mg by mouth every 6 (six) hours as needed for moderate pain.    Marland Kitchen omeprazole (PRILOSEC) 20 MG capsule OMEPRAZOLE 20 MG PO BID FOR 8 WEEKS, then will reduce to lowest effective dose to control reflux symptoms.pt to see Dr Bryan Lemma back in 1 month 112 capsule 0  . Sod Picosulfate-Mag Ox-Cit Acd (CLENPIQ) 10-3.5-12 MG-GM -GM/160ML SOLN Take 1 kit by mouth as directed. 2 Bottle 0    Current Facility-Administered Medications  Medication Dose Route Frequency Provider Last Rate Last Dose  . 0.9 %  sodium chloride infusion  500 mL Intravenous Once Jaydon Soroka V, DO       No Known Allergies   Review of Systems: All systems reviewed and negative except where noted in HPI.     Physical Exam:    Physical exam not completed due to the nature of this telehealth communication.  Patient was otherwise alert and oriented and well communicative.   ASSESSMENT AND PLAN;   CYDNE GRAHN is a 63 y.o. female presenting with:  1) GERD with Erosive Esophagitis: Longstanding history of reflux with recent EGD notable for LA grade A esophagitis and small 1 cm hiatal hernia with Hill grade 2 valve.  Discussed pathophysiology of reflux at length and she would like  to proceed with TIF.  Given the current endoscopic restrictions related to the COVID-19 pandemic, unable to proceed with scheduling the procedure at this current time and will proceed as below in the interim:  - Given ongoing breakthrough symptoms, will make empiric change from Prilosec to Protonix 40 mg p.o. twice daily x6 weeks, then plan to titrate to lowest effective dose -Okay to continue Tums as needed -Continue antireflux lifestyle measures -When current endoscopic restrictions are lifted, plan to schedule for Transoral Incision was Fundoplication -Reviewed the risks, benefits, alternatives of TIF, to include laparoscopic hernia repair/fundoplication, Linx, etc. and she would like to proceed with TIF. -Reviewed the dietary and exercise/activity limitations following TIF  2) IBS-C: -Well-controlled with current dietary modifications, increase dietary fiber, fiber supplement -Pain well controlled with intermittent use of Bentyl.  Has had to use less lately given improved overall symptoms.  No plan to start additional neuromodulation -Not currently taking any laxatives -Resume adequate fluid intake  3)  History of Diverticulosis: Colonoscopy with diverticulosis without clinical or endoscopic evidence of diverticulitis     Lavena Bullion, DO, FACG  04/01/2018, 1:51 PM   Gregor Hams, MD

## 2018-04-01 NOTE — Patient Instructions (Signed)
We have sent the following medications to your pharmacy for you to pick up at your convenience: Protonix 36m by mouth twice daily for 6 weeks then decrease to once daily.  We will call you to schedule your TIF procedure when available.  For more information on the TIF procedure please visit the website at [email or web address] It was a pleasure to see you today!  Vito Cirigliano, D.O.

## 2018-04-02 ENCOUNTER — Encounter: Payer: Self-pay | Admitting: Emergency Medicine

## 2018-04-02 ENCOUNTER — Other Ambulatory Visit: Payer: Self-pay

## 2018-04-02 ENCOUNTER — Emergency Department
Admission: EM | Admit: 2018-04-02 | Discharge: 2018-04-02 | Disposition: A | Discharge status: Home / Self Care | Payer: Federal, State, Local not specified - PPO | Source: Home / Self Care

## 2018-04-02 DIAGNOSIS — W010XXA Fall on same level from slipping, tripping and stumbling without subsequent striking against object, initial encounter: Secondary | ICD-10-CM

## 2018-04-02 DIAGNOSIS — S01511A Laceration without foreign body of lip, initial encounter: Secondary | ICD-10-CM | POA: Diagnosis not present

## 2018-04-02 MED ORDER — ACETAMINOPHEN 325 MG PO TABS
650.0000 mg | ORAL_TABLET | Freq: Once | ORAL | Status: AC
  Administered 2018-04-02: 650 mg via ORAL

## 2018-04-02 NOTE — ED Triage Notes (Signed)
Pt comes in today after a fall going up 3 stairs. Pt denies syncope. Pt states that she went face forward hitting her right lip causing a penetration. Bleeding controlled at this time.

## 2018-04-02 NOTE — Discharge Instructions (Signed)
°  You may take Tylenol and Motrin as needed for pain.  Once the glue has completely dried, you may apply a cool pack to help with pain and swelling of your lip.  You may use warm saltwater rinses to help with the wound inside your mouth.  It is recommended you contact your dentist's emergency number to see if they recommend you get a specific dental x-ray or dedicated dental exam in their office.

## 2018-04-02 NOTE — ED Notes (Signed)
Pt ambulatory at discharge. NAD noted; chest rise and fall symmetrically. Discharge instructions reviewed with understanding of plan of care. Pt alert and oriented x 4.

## 2018-04-02 NOTE — ED Provider Notes (Signed)
[facility]    CSN: [id number] Arrival date & time: 04/02/18  1319     History   Chief Complaint Chief Complaint  Patient presents with  . Fall  . Lip Laceration    HPI Kristen Nichols is a 63 y.o. female.   HPI Kristen Nichols is a 62 y.o. female presenting to UC with c/o laceration to her lower lip after trip and fall onto cement steps around 1PM today. Pt was rushing and missed a step causing her to fall. Denies LOC. Pain is mildly sore around her lip and teeth.  Denies other injuries from the fall. Denies neck or back pain. No dizziness or nausea. No medication taken PTA. She is not on blood thinners.  Last tetanus: January 2020.  Past Medical History:  Diagnosis Date  . Allergy    Hayfever  . Anxiety   . Benign essential hypertension 10/13/2012   Last Assessment & Plan:  Well controlled.    . Colitis   . Diverticulosis   . GERD (gastroesophageal reflux disease)   . Headache(784.0)   . History of colon polyps   . Hypertension   . IBS (irritable bowel syndrome)   . UC (ulcerative colitis) [facility])     Patient Active Problem List   Diagnosis Date Noted  . Colitis 04/09/2016  . Insomnia 02/23/2016  . Dyslipidemia 12/29/2014  . Carpal tunnel syndrome 12/29/2014  . Allergic rhinitis 10/13/2012  . Benign essential hypertension 10/13/2012  . Depression, major, single episode, complete remission ([place]) 05/07/2010    Past Surgical History:  Procedure Laterality Date  . ANKLE SURGERY    . BREAST BIOPSY    . BREAST CYST ASPIRATION    . CESAREAN SECTION    . COLONOSCOPY     has had 2 or 3 of them per patient. Last one was around 2016 with [facility] or maybe [facility]   . NASAL SINUS SURGERY      OB History   No obstetric history on file.      Home Medications    Prior to Admission medications   Medication Sig Start Date End Date Taking? Authorizing Provider  buPROPion (WELLBUTRIN XL) 150 MG 24 hr tablet Take 1 tablet (150 mg total) by mouth  daily. 02/05/18   Gregor Hams, MD  dicyclomine (BENTYL) 20 MG tablet TAKE 0.5-1 TABLETS (10-20 MG TOTAL) BY MOUTH 4 (FOUR) TIMES DAILY - BEFORE MEALS AND AT BEDTIME. AS NEEDED CRAMPS 03/31/18   Gregor Hams, MD  fexofenadine (ALLEGRA) 180 MG tablet Take 180 mg by mouth daily as needed for allergies or rhinitis.    [provider]  FLUoxetine (PROZAC) 20 MG tablet Take 1 tablet (20 mg total) by mouth daily. 02/05/18   Gregor Hams, MD  fluticasone [facility]) 50 MCG/ACT nasal spray SPRAY 2 SPRAYS INTO EACH NOSTRIL EVERY DAY 04/01/18   Gregor Hams, MD  hydrochlorothiazide (HYDRODIURIL) 25 MG tablet Take 1 tablet (25 mg total) by mouth daily. 02/05/18   Gregor Hams, MD  ibuprofen (ADVIL,MOTRIN) 200 MG tablet Take 600-800 mg by mouth every 6 (six) hours as needed for moderate pain.    [provider]  omeprazole (PRILOSEC) 20 MG capsule OMEPRAZOLE 20 MG PO BID FOR 8 WEEKS, then will reduce to lowest effective dose to control reflux symptoms.pt to see Dr Bryan Lemma back in 1 month 02/26/18   Cirigliano, Vito V, DO  pantoprazole (PROTONIX) 40 MG tablet Take 1 tablet (40 mg total) by mouth as  directed. Take 1 tablet by mouth twice daily for 6 weeks then decrease to once daily. 04/01/18 05/13/18  Cirigliano, Vito V, DO  pantoprazole (PROTONIX) 40 MG tablet Take 1 tablet (40 mg total) by mouth daily. 05/12/18   Cirigliano, Vito V, DO  Sod Picosulfate-Mag Ox-Cit Acd (CLENPIQ) 10-3.5-12 MG-GM -GM/160ML SOLN Take 1 kit by mouth as directed. 02/19/18   Cirigliano, Dominic Pea, DO    Family History Family History  Problem Relation Age of Onset  . Other Mother   . Other Father   . Diabetes Maternal Grandmother   . Colon cancer Neg Hx   . Esophageal cancer Neg Hx     Social History Social History   Tobacco Use  . Smoking status: Former Smoker    Packs/day: 0.50    Years: 20.00    Pack years: 10.00    Types: Cigarettes    Last attempt to quit: 01/09/2016    Years since quitting: 2.2  .  Smokeless tobacco: Never Used  Substance Use Topics  . Alcohol use: Yes    Alcohol/week: 2.0 standard drinks    Types: 2 Cans of beer per week  . Drug use: No     Allergies   Patient has no known allergies.   Review of Systems Review of Systems  Gastrointestinal: Negative for nausea.  Musculoskeletal: Negative for back pain, neck pain and neck stiffness.  Skin: Positive for wound. Negative for color change.  Neurological: Positive for headaches (mild). Negative for dizziness and light-headedness.     Physical Exam Triage Vital Signs ED Triage Vitals  Enc Vitals Group     BP 04/02/18 1347 (!) 143/88     Pulse Rate 04/02/18 1347 80     Resp 04/02/18 1347 18     Temp 04/02/18 1347 97.8 F (36.6 C)     Temp Source 04/02/18 1347 Oral     SpO2 04/02/18 1347 100 %     Weight 04/02/18 1348 140 lb (63.5 kg)     Height 04/02/18 1348 5' 2"  (1.575 m)     Head Circumference --      Peak Flow --      Pain Score 04/02/18 1348 9     Pain Loc --      Pain Edu? --      Excl. in [place]? --    No data found.  Updated Vital Signs BP (!) 143/88 (BP Location: Right Arm)   Pulse 80   Temp 97.8 F (36.6 C) (Oral)   Resp 18   Ht 5' 2"  (1.575 m)   Wt 140 lb (63.5 kg)   SpO2 100%   BMI 25.61 kg/m   Visual Acuity Right Eye Distance:   Left Eye Distance:   Bilateral Distance:    Right Eye Near:   Left Eye Near:    Bilateral Near:     Physical Exam Vitals signs and nursing note reviewed.  Constitutional:      Appearance: Normal appearance. She is well-developed.  HENT:     Head: Normocephalic.     Mouth/Throat:     Lips: Pink.     Mouth: Mucous membranes are moist.     Dentition: Abnormal dentition (Right front upper tooth- scuffed but in tact and sturdy). No dental tenderness or gum lesions.     Pharynx: Oropharynx is clear.   Neck:     Musculoskeletal: Normal range of motion.  Cardiovascular:     Rate and Rhythm: Normal rate and regular rhythm.  Pulmonary:     Effort:  Pulmonary effort is normal.     Breath sounds: Normal breath sounds.  Musculoskeletal: Normal range of motion.  Skin:    General: Skin is warm and dry.  Neurological:     General: No focal deficit present.     Mental Status: She is alert and oriented to person, place, and time.     Gait: Gait normal.  Psychiatric:        Behavior: Behavior normal.      UC Treatments / Results  Labs (all labs ordered are listed, but only abnormal results are displayed) Labs Reviewed - No data to display  EKG None  Radiology No results found.  Procedures Laceration Repair Date/Time: 04/02/2018 3:03 PM Performed by: Noe Gens, PA-C Authorized by: Noe Gens, PA-C   Consent:    Consent obtained:  Verbal   Consent given by:  Patient   Risks discussed:  Infection, pain, poor cosmetic result and poor wound healing   Alternatives discussed: discussed sutures vs wound glue. Anesthesia (see MAR for exact dosages):    Anesthesia method:  None Laceration details:    Location:  Face   Facial location: lower lip.   Length (cm):  0.5   Depth (mm):  2 Exploration:    Hemostasis achieved with:  Direct pressure   Wound exploration: wound explored through full range of motion and entire depth of wound probed and visualized     Contaminated: no   Treatment:    Area cleansed with:  Saline   Amount of cleaning:  Standard Skin repair:    Repair method:  Tissue adhesive Approximation:    Approximation:  Close Post-procedure details:    Dressing:  Open (no dressing)   Patient tolerance of procedure:  Tolerated well, no immediate complications   (including critical care time)  Medications Ordered in UC Medications  acetaminophen (TYLENOL) tablet 650 mg (650 mg Oral Given 04/02/18 1403)    Initial Impression / Assessment and Plan / UC Course  I have reviewed the triage vital signs and the nursing notes.  Pertinent labs & imaging results that were available during my care of the  patient were reviewed by me and considered in my medical decision making (see chart for details).     Wound repaired with wound glue No neuro deficit, no evidence of intracranial head bleed Teeth appear in tact and stable, low suspicion for fracture at this time, however, due to scuff noted on top front tooth, recommended f/u with dentist AVS provided  Final Clinical Impressions(s) / UC Diagnoses   Final diagnoses:  Fall from slip, trip, or stumble, initial encounter  Lip laceration, initial encounter     Discharge Instructions      You may take Tylenol and Motrin as needed for pain.  Once the glue has completely dried, you may apply a cool pack to help with pain and swelling of your lip.  You may use warm saltwater rinses to help with the wound inside your mouth.  It is recommended you contact your dentist's emergency number to see if they recommend you get a specific dental x-ray or dedicated dental exam in their office.     ED Prescriptions    None     Controlled Substance Prescriptions [place] Controlled Substance Registry consulted? Not Applicable   Tyrell Antonio 04/02/18 1505

## 2018-04-15 ENCOUNTER — Other Ambulatory Visit: Payer: Self-pay | Admitting: Gastroenterology

## 2018-04-23 ENCOUNTER — Other Ambulatory Visit: Payer: Self-pay | Admitting: Family Medicine

## 2018-04-23 DIAGNOSIS — K582 Mixed irritable bowel syndrome: Secondary | ICD-10-CM

## 2018-05-05 ENCOUNTER — Other Ambulatory Visit: Payer: Self-pay | Admitting: Gastroenterology

## 2018-05-12 ENCOUNTER — Other Ambulatory Visit: Payer: Self-pay

## 2018-05-20 ENCOUNTER — Other Ambulatory Visit: Payer: Self-pay | Admitting: Family Medicine

## 2018-05-20 DIAGNOSIS — K582 Mixed irritable bowel syndrome: Secondary | ICD-10-CM

## 2018-06-01 ENCOUNTER — Other Ambulatory Visit: Payer: Self-pay | Admitting: Gastroenterology

## 2018-06-30 ENCOUNTER — Encounter: Payer: Self-pay | Admitting: Family Medicine

## 2018-06-30 ENCOUNTER — Ambulatory Visit (INDEPENDENT_AMBULATORY_CARE_PROVIDER_SITE_OTHER): Payer: Federal, State, Local not specified - PPO | Admitting: Family Medicine

## 2018-06-30 VITALS — BP 149/74 | HR 85 | Temp 98.2°F | Wt 152.0 lb

## 2018-06-30 DIAGNOSIS — S00551A Superficial foreign body of lip, initial encounter: Secondary | ICD-10-CM | POA: Diagnosis not present

## 2018-06-30 NOTE — Progress Notes (Signed)
Kristen Nichols is a 63 y.o. female who presents to [place] [place] today for painful papule at lip laceration.  Patient fell and suffered a lip laceration in March.  She was seen in the urgent care on March 25.  The wound was irrigated and the external portion of the laceration was repaired with wound adhesive.  The internal mucosal side laceration was allowed to heal with secondary intention.  Kristen Nichols did reasonably well.  However she notes a small papule at the patient right side of the scar on the mucosal border of the lip.  This is painful especially with palpation.  She is concerned that she may have a retained foreign body.  She feels well otherwise with no fevers or chills.  She has a follow-up appointment with her dentist for crown repair later this week.     ROS as above:  Exam:  BP (!) 149/74   Pulse 85   Temp 98.2 F (36.8 C) (Oral)   Wt 152 lb (68.9 kg)   BMI 27.80 kg/m  Wt Readings from Last 5 Encounters:  06/30/18 152 lb (68.9 kg)  04/02/18 140 lb (63.5 kg)  02/26/18 145 lb (65.8 kg)  02/19/18 145 lb (65.8 kg)  02/05/18 145 lb (65.8 kg)    Gen: Well NAD HEENT: EOMI,  MMM mature scar mucosa border of lip with small papule right side of wound.  Tender to palpation.  No fluctuance or induration or erythema.  External scar also mature and nontender.  No palpable masses present. Lungs: Normal work of breathing. CTABL Heart: RRR no MRG Abd: NABS, Soft. Nondistended, Nontender Exts: Brisk capillary refill, warm and well perfused.   Removal of foreign body. Consent obtained and timeout performed. Tissue deep to small papule at right side of scar injected with 1 mL of lidocaine achieving good anesthesia. Sharp scalpel was used to puncture the small papule and a small dark material appearance consistent with a grain of sand was removed.  The wound was  widened with blunt dissection no further foreign bodies are visible or other abnormal structures.  Total depth was less than 1 mm in total width was about a millimeter as well.  No significant bleeding.   Assessment and Plan: 63 y.o. female with foreign body from wound lip.  I believe I removed a small grain of sand foreign body that was superficial causing pain.  However if her symptoms persist neck step would be referral to ENT for further evaluation and management.  Additionally dentist can also take a look at the wound as well.  Blood pressure elevated.  Plan for home blood pressure checking.  PDMP not reviewed this encounter. No orders of the defined types were placed in this encounter.  No orders of the defined types were placed in this encounter.    Historical information moved to improve visibility of documentation.  Past Medical History:  Diagnosis Date  . Allergy    Hayfever  . Anxiety   . Benign essential hypertension 10/13/2012   Last Assessment & Plan:  Well controlled.    . Colitis   . Diverticulosis   . GERD (gastroesophageal reflux disease)   . Headache(784.0)   . History of colon polyps   . Hypertension   . IBS (irritable bowel syndrome)   . UC (ulcerative colitis) [facility])    Past Surgical History:  Procedure Laterality Date  . ANKLE SURGERY    . BREAST BIOPSY    .  BREAST CYST ASPIRATION    . CESAREAN SECTION    . COLONOSCOPY     has had 2 or 3 of them per patient. Last one was around 2016 with [facility] or maybe [facility]   . NASAL SINUS SURGERY     Social History   Tobacco Use  . Smoking status: Former Smoker    Packs/day: 0.50    Years: 20.00    Pack years: 10.00    Types: Cigarettes    Quit date: 01/09/2016    Years since quitting: 2.4  . Smokeless tobacco: Never Used  Substance Use Topics  . Alcohol use: Yes    Alcohol/week: 2.0 standard drinks    Types: 2 Cans of beer per week   family history includes Diabetes in her maternal grandmother;  Other in her father and mother.  Medications: Current Outpatient Medications  Medication Sig Dispense Refill  . buPROPion (WELLBUTRIN XL) 150 MG 24 hr tablet Take 1 tablet (150 mg total) by mouth daily. 90 tablet 1  . dicyclomine (BENTYL) 20 MG tablet TAKE 0.5-1 TABLETS (10-20 MG TOTAL) BY MOUTH 4 (FOUR) TIMES DAILY - BEFORE MEALS AND AT BEDTIME. AS NEEDED CRAMPS 120 tablet 1  . fexofenadine (ALLEGRA) 180 MG tablet Take 180 mg by mouth daily as needed for allergies or rhinitis.    Marland Kitchen FLUoxetine (PROZAC) 20 MG tablet Take 1 tablet (20 mg total) by mouth daily. 90 tablet 1  . fluticasone (FLONASE) 50 MCG/ACT nasal spray SPRAY 2 SPRAYS INTO EACH NOSTRIL EVERY DAY 16 g 0  . hydrochlorothiazide (HYDRODIURIL) 25 MG tablet Take 1 tablet (25 mg total) by mouth daily. 90 tablet 1  . ibuprofen (ADVIL,MOTRIN) 200 MG tablet Take 600-800 mg by mouth every 6 (six) hours as needed for moderate pain.    Marland Kitchen omeprazole (PRILOSEC) 20 MG capsule TAKE 2 CAPSULES BY MOUTH FOR 8 WEEKS, THEN WILL REDUCE TO LOWEST EFFECTIVE DOSE TO CONTROL REFLUX SYMPTOMS.PT TO SEE DR CIRIGLIANO BACK IN 1 MONTH 112 capsule 0  . pantoprazole (PROTONIX) 40 MG tablet Take 1 tablet (40 mg total) by mouth daily. 90 tablet 3  . Sod Picosulfate-Mag Ox-Cit Acd (CLENPIQ) 10-3.5-12 MG-GM -GM/160ML SOLN Take 1 kit by mouth as directed. 2 Bottle 0   Current Facility-Administered Medications  Medication Dose Route Frequency Provider Last Rate Last Dose  . 0.9 %  sodium chloride infusion  500 mL Intravenous Once Cirigliano, Vito V, DO       No Known Allergies   Discussed warning signs or symptoms. Please see discharge instructions. Patient expresses understanding.

## 2018-06-30 NOTE — Patient Instructions (Signed)
Thank you for coming in today. Keep the wound clean. OK to cover with Vaseline if needed.  The swelling will go down in the next few hours to day.  If not resolved let me know. The next step is ENT or oral surgery referral. Generally ENT.

## 2018-07-14 ENCOUNTER — Other Ambulatory Visit: Payer: Self-pay | Admitting: Family Medicine

## 2018-07-14 DIAGNOSIS — Z1231 Encounter for screening mammogram for malignant neoplasm of breast: Secondary | ICD-10-CM

## 2018-07-30 ENCOUNTER — Other Ambulatory Visit: Payer: Self-pay | Admitting: Family Medicine

## 2018-08-26 ENCOUNTER — Other Ambulatory Visit: Payer: Self-pay

## 2018-08-26 ENCOUNTER — Ambulatory Visit (INDEPENDENT_AMBULATORY_CARE_PROVIDER_SITE_OTHER): Payer: Federal, State, Local not specified - PPO

## 2018-08-26 ENCOUNTER — Ambulatory Visit (INDEPENDENT_AMBULATORY_CARE_PROVIDER_SITE_OTHER): Payer: Federal, State, Local not specified - PPO | Admitting: Family Medicine

## 2018-08-26 ENCOUNTER — Encounter: Payer: Self-pay | Admitting: Family Medicine

## 2018-08-26 ENCOUNTER — Ambulatory Visit: Payer: Federal, State, Local not specified - PPO

## 2018-08-26 VITALS — BP 137/77 | HR 93 | Temp 98.3°F | Wt 147.1 lb

## 2018-08-26 DIAGNOSIS — M25571 Pain in right ankle and joints of right foot: Secondary | ICD-10-CM | POA: Diagnosis not present

## 2018-08-26 DIAGNOSIS — M79671 Pain in right foot: Secondary | ICD-10-CM

## 2018-08-26 DIAGNOSIS — M7989 Other specified soft tissue disorders: Secondary | ICD-10-CM | POA: Diagnosis not present

## 2018-08-26 DIAGNOSIS — S99921A Unspecified injury of right foot, initial encounter: Secondary | ICD-10-CM | POA: Diagnosis not present

## 2018-08-26 DIAGNOSIS — S99911A Unspecified injury of right ankle, initial encounter: Secondary | ICD-10-CM | POA: Diagnosis not present

## 2018-08-26 MED ORDER — HYDROCODONE-ACETAMINOPHEN 5-325 MG PO TABS: 1.0000 | ORAL_TABLET | Freq: Four times a day (QID) | ORAL | 0 refills | Status: DC | PRN

## 2018-08-26 NOTE — Progress Notes (Signed)
Kristen Nichols is a 63 y.o. female who presents to [place] today for right ankle pain.  Kristen Nichols was in her normal state of health this weekend when she suffered an inversion injury to her right ankle.  She developed pain and swelling which worsened yesterday.  She notes pain at the lateral aspect of her ankle and into the lateral mid and forefoot.  She has a history of previous ankle sprains.  She notes her symptoms are somewhat consistent with previous ankle sprain.  She denies fevers chills nausea vomiting diarrhea.  She denies significant numbness distally.  She feels pretty well otherwise.  She tried over-the-counter medications for pain control which have not been sufficient to control her pain at times.  The pain can be quite severe and sometimes interferes with sleep.    ROS:  As above  Exam:  BP 137/77 (BP Location: Left Arm, Patient Position: Sitting, Cuff Size: Normal)    Pulse 93    Temp 98.3 F (36.8 C) (Oral)    Wt 147 lb 1.9 oz (66.7 kg)    BMI 26.91 kg/m  Wt Readings from Last 5 Encounters:  08/26/18 147 lb 1.9 oz (66.7 kg)  06/30/18 152 lb (68.9 kg)  04/02/18 140 lb (63.5 kg)  02/26/18 145 lb (65.8 kg)  02/19/18 145 lb (65.8 kg)   General: Well Developed, well nourished, and in no acute distress.  Neuro/Psych: Alert and oriented x3, extra-ocular muscles intact, able to move all 4 extremities, sensation grossly intact. Skin: Warm and dry, no rashes noted.  Respiratory: Not using accessory muscles, speaking in full sentences, trachea midline.  Cardiovascular: Pulses palpable, no extremity edema. Abdomen: Does not appear distended. MSK: Right foot and ankle significant swelling and bruising to the lateral aspect of the ankle and foot. Nontender proximal fibular head.  Normal knee motion. Tender to palpation lateral malleolus and across the anterior aspect of the ankle and even onto the medial malleolus a bit. Tender  palpation along the dorsal lateral forefoot.  Not particularly tender proximal fifth metatarsal. Significant decreased ankle motion. Stability testing not performed due to guarding and pain. Pulses cap refill and sensation are intact distally.   Patient was fitted with an Ace wrap and cam walker boot and had significant improvement in pain and ability to ambulate.  Lab and Radiology Results X-ray images right foot and ankle obtained today personally and independently reviewed. No visible fractures in the foot or ankle.  No significant degenerative changes.  Soft tissue swelling present. Await formal radiology overview    Assessment and Plan: 63 y.o. female with right foot and ankle pain following inversion injury.  Exam initially was concerning for fracture however no visible fracture apparent on x-rays today.  However radiology overread is still pending.  Plan to transition to cam walker boot with ambulation and weightbearing as tolerated.  Patient does have crutches at home that she can use.  Start home range of motion exercises and okay to transition to ankle brace at home in the interim if doing well.  Will use over-the-counter medications for pain and limited hydrocodone for severe pain if needed. Recheck in 2 weeks. Caution patient against driving with the cam walker boot on her right foot.  She can remove the boot to drive.   PDMP reviewed during this encounter. Orders Placed This Encounter  Procedures   DG Ankle Complete Right    Standing Status:   Future    Number of Occurrences:  1    Standing Expiration Date:   10/26/2019    Order Specific Question:   Reason for Exam (SYMPTOM  OR DIAGNOSIS REQUIRED)    Answer:   eval lateral ankle pain    Order Specific Question:   Is patient pregnant?    Answer:   No    Order Specific Question:   Preferred imaging location?    Answer:   [facility]    Order Specific Question:   Radiology Contrast Protocol - do NOT remove  file path    Answer:   \[email or web address]   DG Foot Complete Right    Standing Status:   Future    Number of Occurrences:   1    Standing Expiration Date:   10/26/2019    Order Specific Question:   Reason for Exam (SYMPTOM  OR DIAGNOSIS REQUIRED)    Answer:   eval lateral foot pain after inversion    Order Specific Question:   Is patient pregnant?    Answer:   No    Order Specific Question:   Preferred imaging location?    Answer:   [facility]    Order Specific Question:   Radiology Contrast Protocol - do NOT remove file path    Answer:   [email or web address]   Meds ordered this encounter  Medications   HYDROcodone-acetaminophen (NORCO/VICODIN) 5-325 MG tablet    Sig: Take 1 tablet by mouth every 6 (six) hours as needed.    Dispense:  10 tablet    Refill:  0    Historical information moved to improve visibility of documentation.  Past Medical History:  Diagnosis Date   Allergy    Hayfever   Anxiety    Benign essential hypertension 10/13/2012   Last Assessment & Plan:  Well controlled.     Colitis    Diverticulosis    GERD (gastroesophageal reflux disease)    Headache(784.0)    History of colon polyps    Hypertension    IBS (irritable bowel syndrome)    UC (ulcerative colitis) ([place])    Past Surgical History:  Procedure Laterality Date   ANKLE SURGERY     BREAST BIOPSY     BREAST CYST ASPIRATION     CESAREAN SECTION     COLONOSCOPY     has had 2 or 3 of them per patient. Last one was around 2016 with [facility] or maybe [facility]    NASAL SINUS SURGERY     Social History   Tobacco Use   Smoking status: Former Smoker    Packs/day: 0.50    Years: 20.00    Pack years: 10.00    Types: Cigarettes    Quit date: 01/09/2016    Years since quitting: 2.6   Smokeless tobacco: Never Used  Substance Use Topics   Alcohol use: Yes    Alcohol/week: 2.0 standard  drinks    Types: 2 Cans of beer per week   family history includes Diabetes in her maternal grandmother; Other in her father and mother.  Medications: Current Outpatient Medications  Medication Sig Dispense Refill   buPROPion (WELLBUTRIN XL) 150 MG 24 hr tablet Take 1 tablet (150 mg total) by mouth daily. Due for follow up visit w/PCP 30 tablet 0   dicyclomine (BENTYL) 20 MG tablet TAKE 0.5-1 TABLETS (10-20 MG TOTAL) BY MOUTH 4 (FOUR) TIMES DAILY - BEFORE MEALS AND AT BEDTIME. AS NEEDED CRAMPS 120 tablet 1   fexofenadine (ALLEGRA) 180 MG tablet Take 180 mg by mouth daily as  needed for allergies or rhinitis.     FLUoxetine (PROZAC) 20 MG tablet Take 1 tablet (20 mg total) by mouth daily. Due for follow up visit w/PCP 30 tablet 0   fluticasone (FLONASE) 50 MCG/ACT nasal spray SPRAY 2 SPRAYS INTO EACH NOSTRIL EVERY DAY 16 g 0   hydrochlorothiazide (HYDRODIURIL) 25 MG tablet TAKE 1 TABLET BY MOUTH EVERY DAY 90 tablet 1   ibuprofen (ADVIL,MOTRIN) 200 MG tablet Take 600-800 mg by mouth every 6 (six) hours as needed for moderate pain.     omeprazole (PRILOSEC) 20 MG capsule TAKE 2 CAPSULES BY MOUTH FOR 8 WEEKS, THEN WILL REDUCE TO LOWEST EFFECTIVE DOSE TO CONTROL REFLUX SYMPTOMS.PT TO SEE DR CIRIGLIANO BACK IN 1 MONTH 112 capsule 0   pantoprazole (PROTONIX) 40 MG tablet Take 1 tablet (40 mg total) by mouth daily. 90 tablet 3   HYDROcodone-acetaminophen (NORCO/VICODIN) 5-325 MG tablet Take 1 tablet by mouth every 6 (six) hours as needed. 10 tablet 0   Sod Picosulfate-Mag Ox-Cit Acd (CLENPIQ) 10-3.5-12 MG-GM -GM/160ML SOLN Take 1 kit by mouth as directed. 2 Bottle 0   Current Facility-Administered Medications  Medication Dose Route Frequency Provider Last Rate Last Dose   0.9 %  sodium chloride infusion  500 mL Intravenous Once Cirigliano, Vito V, DO       No Known Allergies    Discussed warning signs or symptoms. Please see discharge instructions. Patient expresses understanding.

## 2018-08-26 NOTE — Patient Instructions (Addendum)
Thank you for coming in today. Use the cam walker boot and crutches as needed. Weight bear as tolerated.  Ok to start range of motion with your foot and ankle.  Recheck in 2 weeks.  Use hydrocodone sparingly.  Return sooner if needed.  It looks like bad ankle sprain.    Ankle Sprain, Phase I Rehab An ankle sprain is an injury to the ligaments of your ankle. Ankle sprains cause stiffness, loss of motion, and loss of strength. Ask your health care provider which exercises are safe for you. Do exercises exactly as told by your health care provider and adjust them as directed. It is normal to feel mild stretching, pulling, tightness, or discomfort as you do these exercises. Stop right away if you feel sudden pain or your pain gets worse. Do not begin these exercises until told by your health care provider. Stretching and range-of-motion exercises These exercises warm up your muscles and joints and improve the movement and flexibility of your lower leg and ankle. These exercises also help to relieve pain and stiffness. Gastroc and soleus stretch This exercise is also called a calf stretch. It stretches the muscles in the back of the lower leg. These muscles are the gastrocnemius, or gastroc, and the soleus. 1. Sit on the floor with your left / right leg extended. 2. Loop a belt or towel around the ball of your left / right foot. The ball of your foot is on the walking surface, right under your toes. 3. Keep your left / right ankle and foot relaxed and keep your knee straight while you use the belt or towel to pull your foot toward you. You should feel a gentle stretch behind your calf or knee in your gastroc muscle. 4. Hold this position for __________ seconds, then release to the starting position. 5. Repeat the exercise with your knee bent. You can put a pillow or a rolled bath towel under your knee to support it. You should feel a stretch deep in your calf in the soleus muscle or at your Achilles  tendon. Repeat __________ times. Complete this exercise __________ times a day. Ankle alphabet  1. Sit with your left / right leg supported at the lower leg. ? Do not rest your foot on anything. ? Make sure your foot has room to move freely. 2. Think of your left / right foot as a paintbrush. ? Move your foot to trace each letter of the alphabet in the air. Keep your hip and knee still while you trace. ? Make the letters as large as you can without feeling discomfort. 3. Trace every letter from A to Z. Repeat __________ times. Complete this exercise __________ times a day. Strengthening exercises These exercises build strength and endurance in your ankle and lower leg. Endurance is the ability to use your muscles for a long time, even after they get tired. Ankle dorsiflexion  1. Secure a rubber exercise band or tube to an object, such as a table leg, that will stay still when the band is pulled. Secure the other end around your left / right foot. 2. Sit on the floor facing the object, with your left / right leg extended. The band or tube should be slightly tense when your foot is relaxed. 3. Slowly bring your foot toward you, bringing the top of your foot toward your shin (dorsiflexion), and pulling the band tighter. 4. Hold this position for __________ seconds. 5. Slowly return your foot to the starting position. Repeat  __________ times. Complete this exercise __________ times a day. Ankle plantar flexion  1. Sit on the floor with your left / right leg extended. 2. Loop a rubber exercise tube or band around the ball of your left / right foot. The ball of your foot is on the walking surface, right under your toes. ? Hold the ends of the band or tube in your hands. ? The band or tube should be slightly tense when your foot is relaxed. 3. Slowly point your foot and toes downward to tilt the top of your foot away from your shin (plantar flexion). 4. Hold this position for __________  seconds. 5. Slowly return your foot to the starting position. Repeat __________ times. Complete this exercise __________ times a day. Ankle eversion 1. Sit on the floor with your legs straight out in front of you. 2. Loop a rubber exercise band or tube around the ball of your left / right foot. The ball of your foot is on the walking surface, right under your toes. ? Hold the ends of the band in your hands, or secure the band to a stable object. ? The band or tube should be slightly tense when your foot is relaxed. 3. Slowly push your foot outward, away from your other leg (eversion). 4. Hold this position for __________ seconds. 5. Slowly return your foot to the starting position. Repeat __________ times. Complete this exercise __________ times a day. This information is not intended to replace advice given to you by your health care provider. Make sure you discuss any questions you have with your health care provider. Document Released: 07/26/2004 Document Revised: 04/15/2018 Document Reviewed: 10/07/2017 Elsevier Patient Education  2020 [employer].

## 2018-08-27 ENCOUNTER — Other Ambulatory Visit: Payer: Self-pay | Admitting: Family Medicine

## 2018-09-03 ENCOUNTER — Ambulatory Visit: Payer: Federal, State, Local not specified - PPO

## 2018-09-11 ENCOUNTER — Ambulatory Visit (INDEPENDENT_AMBULATORY_CARE_PROVIDER_SITE_OTHER): Payer: Federal, State, Local not specified - PPO | Admitting: Family Medicine

## 2018-09-11 ENCOUNTER — Ambulatory Visit (INDEPENDENT_AMBULATORY_CARE_PROVIDER_SITE_OTHER): Payer: Federal, State, Local not specified - PPO

## 2018-09-11 ENCOUNTER — Encounter: Payer: Self-pay | Admitting: Family Medicine

## 2018-09-11 ENCOUNTER — Other Ambulatory Visit: Payer: Self-pay

## 2018-09-11 VITALS — BP 145/78 | HR 101 | Temp 98.2°F | Wt 151.0 lb

## 2018-09-11 DIAGNOSIS — Z23 Encounter for immunization: Secondary | ICD-10-CM | POA: Diagnosis not present

## 2018-09-11 DIAGNOSIS — S82891A Other fracture of right lower leg, initial encounter for closed fracture: Secondary | ICD-10-CM

## 2018-09-11 DIAGNOSIS — M25571 Pain in right ankle and joints of right foot: Secondary | ICD-10-CM

## 2018-09-11 MED ORDER — AMITRIPTYLINE HCL 25 MG PO TABS: 25.0000 mg | ORAL_TABLET | Freq: Every evening | ORAL | 1 refills | Status: DC | PRN

## 2018-09-11 MED ORDER — DICLOFENAC SODIUM 1 % TD GEL: 4.0000 g | Freq: Four times a day (QID) | TRANSDERMAL | 11 refills | Status: DC

## 2018-09-11 NOTE — Patient Instructions (Signed)
Thank you for coming in today. Continue cam walker and limited weight bearing.  Your pain is more than I would expect at this time.  We will try amitrytpline at bedtime.  Also will diclofenac gel for pain 4x daily.  Continue compression as needed.  You may find that 1 crutch or a cane in your left hand is helpful.  If not getting a lot better we will proceed to MRI likely in 2 weeks.   This could be early CRPS. Dont get too freaked about that diagnosis when you read about it. Most people get better.    Complex Regional Pain Syndrome Complex regional pain syndrome (CRPS) is a nerve disorder that causes long-term (chronic) pain. This is usually in a hand, arm, foot, or leg. CRPS usually occurs after an injury or trauma, such as a fracture or sprain. There are two types of CRPS:  Type 1. This type occurs after an injury with no known damage to a nerve.  Type 2. This type occurs after an injury that damages a nerve. There are three stages of the condition:  Stage 1. This stage, called the acute stage, may last for up to 3 months.  Stage 2. This stage, called the dystrophic stage, may last for 3-12 months.  Stage 3. This stage, called the atrophic stage, may start after one year. CRPS ranges from mild to severe. For most people, CRPS is mild and recovery happens over time. For others, CRPS lasts for a very long time and makes everyday tasks hard to do. What are the causes? The exact cause of this condition is not known. It is usually triggered by an injury. What increases the risk? You are more likely to develop this condition if:  You are female.  You have any of the following injuries: ? A wrist fracture that involves a lower arm bone (distal radius fracture). ? Ankle dislocation or fracture. ? A long surgery time. ? Possible nerve injury during surgery. What are the signs or symptoms? Signs and symptoms in the affected hand, arm, foot, or leg are different for each stage.  Signs and symptoms of stage 1 include:  Burning pain.  A prickling, tingling feeling (pins and needles sensation).  Extremely sensitive skin.  Swelling.  Joint stiffness.  Warmth and redness.  Excessive sweating.  Hair and nail growth that is faster than normal. Signs and symptoms of stage 2 include:  Spreading of pain to the whole arm or leg.  Increased skin sensitivity.  Increased swelling and stiffness.  Coolness of the skin.  Blue discoloration of skin.  Loss of skin wrinkles.  Brittle fingernails. Signs and symptoms of stage 3 include:  Pain that spreads to other areas of the body but becomes less severe.  More stiffness, leading to loss of motion.  Skin that is pale, dry, shiny, or tightly stretched. How is this diagnosed? This condition may be diagnosed based on:  Your signs and symptoms.  A physical exam. There is no test to diagnose CRPS, but you may have tests:  To check for bone changes that might indicate CRPS. These tests may include an MRI or bone scan.  To rule out other possible causes of your symptoms. How is this treated? Early treatment may prevent CRPS from advancing past stage 1. There is not one treatment that works for everyone. Treatment options may include:  Medicines, which may include: ? NSAIDs, such as ibuprofen. ? Steroids. ? Blood pressure drugs. ? Antidepressants. ? Anti-seizure drugs. ?  Pain relievers.  Exercise.  Occupational therapy (OT) and physical therapy (PT).  Biofeedback.  Mental health counseling.  Numbing injections.  Spinal surgery to implant a spinal cord stimulator or a pain pump. Follow these instructions at home: Medicines  Take over-the-counter and prescription medicines only as told by your health care provider.  Do not drive or use heavy machinery while taking prescription pain medicine.  If you are taking prescription pain medicine, take actions to prevent or treat constipation. Your  health care provider may recommend that you: ? Drink enough fluid to keep your urine pale yellow. ? Eat foods that are high in fiber, such as fresh fruits and vegetables, whole grains, and beans. ? Limit foods that are high in fat and processed sugars, such as fried or sweet foods. ? Take an over-the-counter or prescription medicine for constipation. General instructions  Do not use any products that contain nicotine or tobacco, such as cigarettes and e-cigarettes. If you need help quitting, ask your health care provider.  Maintain a healthy weight.  Return to your normal activities as told by your health care provider. Ask your health care provider what activities are safe for you.  Follow an exercise program as directed by your health care provider.  Keep all follow-up visits as told by your health care provider. This is important. Contact a health care provider if:  Your symptoms change.  Your symptoms get worse.  You develop anxiety or depression. Summary  Complex regional pain syndrome (CRPS) is a nerve disorder that causes long-term (chronic) pain, usually in a hand, arm, leg, or foot.  CRPS usually occurs after an injury or trauma, such as a fracture or sprain.  CRPS ranges from mild to severe. Early treatment may prevent CRPS from advancing to more severe stages. This information is not intended to replace advice given to you by your health care provider. Make sure you discuss any questions you have with your health care provider. Document Released: 12/15/2001 Document Revised: 12/07/2016 Document Reviewed: 11/19/2016 Elsevier Patient Education  [employer].   I will be moving to full time Sports Medicine in [place] starting on November 1st.  You will still be able to see me for your Sports Medicine or Orthopedic needs at [facility] in [place]. I will still be part of [facility].    If you want to stay locally for your Sports Medicine issues  Dr. Dianah Field here in [place] will be happy to see you.  Additionally Dr. Clearance Coots at [facility] will be happy to see you for sports medicine issues more locally.   For your primary care needs you are welcome to establish care with Dr. Emeterio Reeve.  We are working quickly to hire more physicians to cover the primary care needs however if you cannot get an appointment with Dr. Sheppard Coil in a timely manner [facility] has locations and openings for primary care services nearby.   [facility] at [facility] [address] . [employer] , [place]: [phone] . Behavioral Medicine: [phone] . Fax: [place] at [employer] [address] . [place], [place]: [phone] . Behavioral Medicine: [phone] . Fax: [phone] . Hours (M-F): 7am - Academic librarian At [facility]. [place], [place]: [phone] . Behavioral Medicine: [phone] . Fax: [phone] . Hours (M-F): [place]  at [place] . [place], [name] Phone: [phone] . Behavioral Medicine: [phone] . Fax: [phone]

## 2018-09-11 NOTE — Progress Notes (Signed)
Kristen Nichols is a 63 y.o. female who presents to [facility]: [facility] today for follow-up right foot and ankle pain.  Patient suffered inversion injury to her right foot and ankle on or around August 15.  She was seen on August 18 in clinic.  At that time she was having pain and swelling at the lateral foot and ankle.  X-ray foot was normal.  X-ray right ankle tiny avulsion fragment from lateral malleolus.  She was treated with cam walker boot relative rest over-the-counter medication and limited hydrocodone.  Also reviewed home exercise program.  In the interim she notes that she is had considerable continued right ankle pain.  Pain is located at the lateral ankle.  Pain can be quite severe at times and sometimes interferes with sleep.  She is very compliant with cam walker..  When she does do weightbearing is pretty painful.  She uses a knee scooter to get around but with walking it still sore.   ROS as above:  Exam:  BP (!) 145/78   Pulse (!) 101   Temp 98.2 F (36.8 C) (Oral)   Wt 151 lb (68.5 kg)   BMI 27.62 kg/m  Wt Readings from Last 5 Encounters:  09/11/18 151 lb (68.5 kg)  08/26/18 147 lb 1.9 oz (66.7 kg)  06/30/18 152 lb (68.9 kg)  04/02/18 140 lb (63.5 kg)  02/26/18 145 lb (65.8 kg)    Gen: Well NAD HEENT: EOMI,  MMM Lungs: Normal work of breathing. CTABL Heart: RRR no MRG Abd: NABS, Soft. Nondistended, Nontender Exts: Brisk capillary refill, warm and well perfused.  Right ankle normal-appearing with no large swelling or skin change. Significantly tender to palpation at right lateral malleolus lateral and anterior aspect. Decreased range of motion of the foot and ankle due to guarding. Stability ligamentous testing not performed due to patient guarding and pain. Forefoot is nontender.  Pulses cap refill and sensation are intact distally.  Lab and  Radiology Results X-ray images right ankle obtained today personally independently reviewed. No significant change of tiny avulsion fracture/chip seen on prior x-ray.  Normal alignment without significant or severe degenerative changes.  X-rays not much change. Await formal radiology review  Limited musculoskeletal ultrasound right lateral ankle reveals soft tissue hypoechoic marbling soft tissue overlying the lateral malleolus at the anterior and lateral aspect. Bony fragment visible similar to x-ray. Impression: Tiny avulsion fracture lateral ankle   Assessment and Plan: 63 y.o. female with right lateral ankle pain following avulsion fracture.  Patient has continued considerable pain.  Her pain is more than I would expect for this injury at this time.  Plan to continue reduced weightbearing.  Reduce weightbearing a bit further and recommend crutch use while ambulatory with Cam walker boot.  Continue knee scooter.  Her description of pain is somewhat concerning for early complex regional pain syndrome.  Plan to try amitriptyline at bedtime along with some diclofenac gel.  Recheck in 2 weeks.  If not improving may consider MRI in near future.  Influenza vaccine given today.  I informed patient that I am transitioning to sports medicine only [facility] in [place] starting in November.  Happy to see patient for continued sports medicine needs.  Discussed need for new PCP.  Provided some recommendations.  Patient met Dr. Sheppard Coil today.  PDMP reviewed during this encounter. Orders Placed This Encounter  Procedures  . DG Ankle Complete Right    Standing  Status:   Future    Standing Expiration Date:   11/11/2019    Order Specific Question:   Reason for Exam (SYMPTOM  OR DIAGNOSIS REQUIRED)    Answer:   eval right ankle pain    Order Specific Question:   Is patient pregnant?    Answer:   No    Order Specific Question:   Preferred imaging location?    Answer:   [facility]    Order Specific Question:   Radiology Contrast Protocol - do NOT remove file path    Answer:   [email or web address]  . Flu Vaccine QUAD 6+ mos PF IM (Fluarix Quad PF)   No orders of the defined types were placed in this encounter.    Historical information moved to improve visibility of documentation.  Past Medical History:  Diagnosis Date  . Allergy    Hayfever  . Anxiety   . Benign essential hypertension 10/13/2012   Last Assessment & Plan:  Well controlled.    . Colitis   . Diverticulosis   . GERD (gastroesophageal reflux disease)   . Headache(784.0)   . History of colon polyps   . Hypertension   . IBS (irritable bowel syndrome)   . UC (ulcerative colitis) [facility])    Past Surgical History:  Procedure Laterality Date  . ANKLE SURGERY    . BREAST BIOPSY    . BREAST CYST ASPIRATION    . CESAREAN SECTION    . COLONOSCOPY     has had 2 or 3 of them per patient. Last one was around 2016 with [facility] or maybe [facility]   . NASAL SINUS SURGERY     Social History   Tobacco Use  . Smoking status: Former Smoker    Packs/day: 0.50    Years: 20.00    Pack years: 10.00    Types: Cigarettes    Quit date: 01/09/2016    Years since quitting: 2.6  . Smokeless tobacco: Never Used  Substance Use Topics  . Alcohol use: Yes    Alcohol/week: 2.0 standard drinks    Types: 2 Cans of beer per week   family history includes Diabetes in her maternal grandmother; Other in her father and mother.  Medications: Current Outpatient Medications  Medication Sig Dispense Refill  . buPROPion (WELLBUTRIN XL) 150 MG 24 hr tablet TAKE 1 TABLET (150 MG TOTAL) BY MOUTH DAILY. DUE FOR FOLLOW UP VISIT W/PCP 90 tablet 1  . dicyclomine (BENTYL) 20 MG tablet TAKE 0.5-1 TABLETS (10-20 MG TOTAL) BY MOUTH 4 (FOUR) TIMES DAILY - BEFORE MEALS AND AT BEDTIME. AS NEEDED CRAMPS 120 tablet 1  . fexofenadine (ALLEGRA) 180 MG tablet Take 180 mg by mouth daily as  needed for allergies or rhinitis.    . Fluoxetine HCl, PMDD, 20 MG TABS TAKE 1 TABLET (20 MG TOTAL) BY MOUTH DAILY. DUE FOR FOLLOW UP VISIT W/PCP 90 tablet 1  . fluticasone (FLONASE) 50 MCG/ACT nasal spray SPRAY 2 SPRAYS INTO EACH NOSTRIL EVERY DAY 16 g 0  . hydrochlorothiazide (HYDRODIURIL) 25 MG tablet TAKE 1 TABLET BY MOUTH EVERY DAY 90 tablet 1  . HYDROcodone-acetaminophen (NORCO/VICODIN) 5-325 MG tablet Take 1 tablet by mouth every 6 (six) hours as needed. 10 tablet 0  . ibuprofen (ADVIL,MOTRIN) 200 MG tablet Take 600-800 mg by mouth every 6 (six) hours as needed for moderate pain.    [facility] Kitchen omeprazole (PRILOSEC) 20 MG capsule TAKE 2 CAPSULES BY MOUTH FOR 8 WEEKS, THEN WILL REDUCE TO LOWEST EFFECTIVE DOSE TO  CONTROL REFLUX SYMPTOMS.PT TO SEE DR CIRIGLIANO BACK IN 1 MONTH 112 capsule 0  . pantoprazole (PROTONIX) 40 MG tablet Take 1 tablet (40 mg total) by mouth daily. 90 tablet 3  . Sod Picosulfate-Mag Ox-Cit Acd (CLENPIQ) 10-3.5-12 MG-GM -GM/160ML SOLN Take 1 kit by mouth as directed. 2 Bottle 0   Current Facility-Administered Medications  Medication Dose Route Frequency Provider Last Rate Last Dose  . 0.9 %  sodium chloride infusion  500 mL Intravenous Once Cirigliano, Vito V, DO       No Known Allergies   Discussed warning signs or symptoms. Please see discharge instructions. Patient expresses understanding.

## 2018-09-24 ENCOUNTER — Ambulatory Visit (INDEPENDENT_AMBULATORY_CARE_PROVIDER_SITE_OTHER): Payer: Federal, State, Local not specified - PPO | Admitting: Family Medicine

## 2018-09-24 ENCOUNTER — Other Ambulatory Visit: Payer: Self-pay

## 2018-09-24 ENCOUNTER — Encounter: Payer: Self-pay | Admitting: Family Medicine

## 2018-09-24 VITALS — BP 134/85 | HR 103 | Temp 98.2°F | Wt 150.0 lb

## 2018-09-24 DIAGNOSIS — M25571 Pain in right ankle and joints of right foot: Secondary | ICD-10-CM

## 2018-09-24 DIAGNOSIS — S82891A Other fracture of right lower leg, initial encounter for closed fracture: Secondary | ICD-10-CM | POA: Diagnosis not present

## 2018-09-24 NOTE — Patient Instructions (Addendum)
Thank you for coming in today. Ok to wean into lace up ankle brace.  ASO Medspec ankle is brace is usually what I will use.  You can get one at [facility].  Recheck in 4 weeks unless not doing well.  Let me know if you are not improving.

## 2018-09-24 NOTE — Progress Notes (Signed)
Kristen Nichols is a 63 y.o. female who presents to [place] today for follow-up avulsion fracture.  Patient suffered inversion injury on August 15.  She was evaluated on August 18 clinic found to have a tiny avulsion fracture at lateral malleolus.  She was treated with cam walker boot.  She had return recheck evaluation on September 3 at that time she was having considerable continued right ankle pain.  The pain was quite severe at time despite excellent use of cam walker boot.  She notes that weightbearing was quite painful and she used a knee scooter to limit weightbearing.  Repeat x-ray did not show any other injury aside from the tiny thin avulsion fragment at right lateral malleolus.  Her exam was somewhat concerning for early complex regional pain syndrome.  Plan to continue to limit weightbearing with Cam walker boot and recheck today.  In the interim she notes that she has had improvement.  She still has some pain but overall is a lot better.  She is able to walk more normally with a cam walker boot.    ROS:  As above  Exam:  BP 134/85   Pulse (!) 103   Temp 98.2 F (36.8 C) (Oral)   Wt 150 lb (68 kg)   BMI 27.44 kg/m  Wt Readings from Last 5 Encounters:  09/24/18 150 lb (68 kg)  09/11/18 151 lb (68.5 kg)  08/26/18 147 lb 1.9 oz (66.7 kg)  06/30/18 152 lb (68.9 kg)  04/02/18 140 lb (63.5 kg)   General: Well Developed, well nourished, and in no acute distress.  Neuro/Psych: Alert and oriented x3, extra-ocular muscles intact, able to move all 4 extremities, sensation grossly intact. Skin: Warm and dry, no rashes noted.  Respiratory: Not using accessory muscles, speaking in full sentences, trachea midline.  Cardiovascular: Pulses palpable, no extremity edema. Abdomen: Does not appear distended. MSK: Right foot and ankle no significant swelling.  Skin is more red distal to the ankle compared to the contralateral left foot.  Pulses  cap refill and sensation are intact.  Tender to palpation with palpation at ATFL area right lateral ankle.  Decreased range of motion.  Patient guards with stability testing.    Lab and Radiology Results Dg Ankle Complete Right  Result Date: 09/12/2018 CLINICAL DATA:  RIGHT ankle pain, follow-up, not improving EXAM: RIGHT ANKLE - COMPLETE 3+ VIEW COMPARISON:  08/26/2018 FINDINGS: Mild osseous demineralization. Joint spaces preserved. Thin avulsion fragment adjacent to the lateral aspect of the lateral malleolus, unchanged. No additional fracture, dislocation or bone destruction. Decreased soft tissue swelling since prior study. IMPRESSION: Again identified thin avulsion fragment at lateral aspect of lateral malleolus RIGHT ankle. No additional osseous abnormalities. Electronically Signed   By: Lavonia Dana M.D.   On: 09/12/2018 08:25   Dg Ankle Complete Right  Result Date: 08/26/2018 CLINICAL DATA:  Pain following rolling injury EXAM: RIGHT ANKLE - COMPLETE 3+ VIEW COMPARISON:  None. FINDINGS: Frontal, oblique, and lateral views obtained. There is soft tissue swelling medially. There is a small avulsion arising from the lateral malleolus. No other fracture evident. No appreciable joint effusion. No joint space narrowing or erosion. Ankle mortise appears intact. IMPRESSION: Soft tissue swelling laterally with small avulsion arising from lateral malleolus. No other fracture. No appreciable arthropathy. Ankle mortise appears intact. These results will be called to the ordering clinician or representative by the Radiologist Assistant, and communication documented in the PACS or zVision Dashboard. Electronically Signed  By: Lowella Grip III M.D.   On: 08/26/2018 14:36   Dg Foot Complete Right  Result Date: 08/26/2018 CLINICAL DATA:  Pain following rolling injury EXAM: RIGHT FOOT COMPLETE - 3+ VIEW COMPARISON:  None. FINDINGS: Frontal, oblique, and lateral views obtained. No fracture or dislocation.  Joint spaces appear normal. No erosive change. IMPRESSION: No fracture or dislocation.  No evident arthropathy. Electronically Signed   By: Lowella Grip III M.D.   On: 08/26/2018 14:37   I personally (independently) visualized and performed the interpretation of the images attached in this note.     Assessment and Plan: 63 y.o. female with right ankle pain secondary to avulsion fracture at lateral malleolus.  Much improvement now compared to last visit.  Plan to proceed with physical therapy with plan to wean into ASO brace.  Recheck back in about a month.  Return sooner if needed.  Precautions reviewed.   PDMP not reviewed this encounter. Orders Placed This Encounter  Procedures  . Ambulatory referral to Physical Therapy    Referral Priority:   Routine    Referral Type:   Physical Medicine    Referral Reason:   Specialty Services Required    Requested Specialty:   Physical Therapy   No orders of the defined types were placed in this encounter.   Historical information moved to improve visibility of documentation.  Past Medical History:  Diagnosis Date  . Allergy    Hayfever  . Anxiety   . Benign essential hypertension 10/13/2012   Last Assessment & Plan:  Well controlled.    . Colitis   . Diverticulosis   . GERD (gastroesophageal reflux disease)   . Headache(784.0)   . History of colon polyps   . Hypertension   . IBS (irritable bowel syndrome)   . UC (ulcerative colitis) [facility])    Past Surgical History:  Procedure Laterality Date  . ANKLE SURGERY    . BREAST BIOPSY    . BREAST CYST ASPIRATION    . CESAREAN SECTION    . COLONOSCOPY     has had 2 or 3 of them per patient. Last one was around 2016 with [facility] or maybe [facility]   . NASAL SINUS SURGERY     Social History   Tobacco Use  . Smoking status: Former Smoker    Packs/day: 0.50    Years: 20.00    Pack years: 10.00    Types: Cigarettes    Quit date: 01/09/2016    Years since quitting: 2.7  .  Smokeless tobacco: Never Used  Substance Use Topics  . Alcohol use: Yes    Alcohol/week: 2.0 standard drinks    Types: 2 Cans of beer per week   family history includes Diabetes in her maternal grandmother; Other in her father and mother.  Medications: Current Outpatient Medications  Medication Sig Dispense Refill  . amitriptyline (ELAVIL) 25 MG tablet Take 1-2 tablets (25-50 mg total) by mouth at bedtime as needed (Pain). 60 tablet 1  . buPROPion (WELLBUTRIN XL) 150 MG 24 hr tablet TAKE 1 TABLET (150 MG TOTAL) BY MOUTH DAILY. DUE FOR FOLLOW UP VISIT W/PCP 90 tablet 1  . diclofenac sodium (VOLTAREN) 1 % GEL Apply 4 g topically 4 (four) times daily. To affected joint. 100 g 11  . dicyclomine (BENTYL) 20 MG tablet TAKE 0.5-1 TABLETS (10-20 MG TOTAL) BY MOUTH 4 (FOUR) TIMES DAILY - BEFORE MEALS AND AT BEDTIME. AS NEEDED CRAMPS 120 tablet 1  . fexofenadine (ALLEGRA) 180 MG tablet  Take 180 mg by mouth daily as needed for allergies or rhinitis.    . Fluoxetine HCl, PMDD, 20 MG TABS TAKE 1 TABLET (20 MG TOTAL) BY MOUTH DAILY. DUE FOR FOLLOW UP VISIT W/PCP 90 tablet 1  . fluticasone (FLONASE) 50 MCG/ACT nasal spray SPRAY 2 SPRAYS INTO EACH NOSTRIL EVERY DAY 16 g 0  . hydrochlorothiazide (HYDRODIURIL) 25 MG tablet TAKE 1 TABLET BY MOUTH EVERY DAY 90 tablet 1  . HYDROcodone-acetaminophen (NORCO/VICODIN) 5-325 MG tablet Take 1 tablet by mouth every 6 (six) hours as needed. 10 tablet 0  . ibuprofen (ADVIL,MOTRIN) 200 MG tablet Take 600-800 mg by mouth every 6 (six) hours as needed for moderate pain.    Marland Kitchen omeprazole (PRILOSEC) 20 MG capsule TAKE 2 CAPSULES BY MOUTH FOR 8 WEEKS, THEN WILL REDUCE TO LOWEST EFFECTIVE DOSE TO CONTROL REFLUX SYMPTOMS.PT TO SEE DR CIRIGLIANO BACK IN 1 MONTH 112 capsule 0  . pantoprazole (PROTONIX) 40 MG tablet Take 1 tablet (40 mg total) by mouth daily. 90 tablet 3   No current facility-administered medications for this visit.    No Known Allergies    Discussed warning  signs or symptoms. Please see discharge instructions. Patient expresses understanding.

## 2018-09-30 ENCOUNTER — Other Ambulatory Visit: Payer: Self-pay

## 2018-09-30 DIAGNOSIS — K2100 Gastro-esophageal reflux disease with esophagitis, without bleeding: Secondary | ICD-10-CM

## 2018-09-30 DIAGNOSIS — K449 Diaphragmatic hernia without obstruction or gangrene: Secondary | ICD-10-CM

## 2018-09-30 DIAGNOSIS — K5730 Diverticulosis of large intestine without perforation or abscess without bleeding: Secondary | ICD-10-CM

## 2018-09-30 DIAGNOSIS — K581 Irritable bowel syndrome with constipation: Secondary | ICD-10-CM

## 2018-09-30 NOTE — Progress Notes (Signed)
amb ref sent for TIF procedure

## 2018-10-01 ENCOUNTER — Encounter: Payer: Self-pay | Admitting: Physical Therapy

## 2018-10-01 ENCOUNTER — Ambulatory Visit (INDEPENDENT_AMBULATORY_CARE_PROVIDER_SITE_OTHER): Payer: Federal, State, Local not specified - PPO | Admitting: Physical Therapy

## 2018-10-01 ENCOUNTER — Other Ambulatory Visit: Payer: Self-pay

## 2018-10-01 DIAGNOSIS — M25671 Stiffness of right ankle, not elsewhere classified: Secondary | ICD-10-CM

## 2018-10-01 DIAGNOSIS — R2689 Other abnormalities of gait and mobility: Secondary | ICD-10-CM

## 2018-10-01 DIAGNOSIS — M25571 Pain in right ankle and joints of right foot: Secondary | ICD-10-CM | POA: Diagnosis not present

## 2018-10-01 NOTE — Patient Instructions (Signed)
Access Code: [email or web address]  URL: [email or web address]  Date: 10/01/2018  Prepared by: Faustino Congress   Exercises  Seated Ankle Alphabet - 1 sets - 1 A-Z - 2x daily - 7x weekly  Seated Ankle Pumps on Table - 15 reps - 1 sets - 2x daily - 7x weekly  Seated Ankle Circles - 15 reps - 1 sets - 2x daily - 7x weekly  Seated Ankle Inversion Eversion AROM - 15 reps - 1 sets - 2x daily - 7x weekly  Seated Dorsiflexion Stretch - 3 reps - 1 sets - 10-15 sec hold - 2x daily - 7x weekly

## 2018-10-01 NOTE — Therapy (Signed)
[facility] [place], [place], [address] Phone: [phone]   Fax:  [phone]  Physical Therapy Evaluation  Patient Details  Name: Kristen Nichols MRN: [id number] Date of Birth: [birth year] Referring Provider (PT): Gregor Hams, MD   Encounter Date: 10/01/2018  PT End of Session - 10/01/18 1549    Visit Number  1    Number of Visits  12    Date for PT Re-Evaluation  11/12/18    PT Start Time  1324    PT Stop Time  1547    PT Time Calculation (min)  41 min    Activity Tolerance  Patient tolerated treatment well    Behavior During Therapy  [facility] for tasks assessed/performed       Past Medical History:  Diagnosis Date  . Allergy    Hayfever  . Anxiety   . Benign essential hypertension 10/13/2012   Last Assessment & Plan:  Well controlled.    . Colitis   . Diverticulosis   . GERD (gastroesophageal reflux disease)   . Headache(784.0)   . History of colon polyps   . Hypertension   . IBS (irritable bowel syndrome)   . UC (ulcerative colitis) [facility])     Past Surgical History:  Procedure Laterality Date  . ANKLE SURGERY    . BREAST BIOPSY    . BREAST CYST ASPIRATION    . CESAREAN SECTION    . COLONOSCOPY     has had 2 or 3 of them per patient. Last one was around 2016 with [facility] or maybe [facility]   . NASAL SINUS SURGERY      There were no vitals filed for this visit.   Subjective Assessment - 10/01/18 1508    Subjective  Pt is a 63 y/o female who presents to [facility] for Rt ankle sprain with closed avulsion fx.  Pt reports she fell on 08/23/18, rolling her ankle and falling to the ground.  Pt presents today with continued use of boot, and is now able to walk without AD and using ASO and boot for additional support.  Wearing the ASO only through first part of the day, then as pain progresses she puts on boot.    Limitations  Sitting;Standing;Walking    How long can you stand comfortably?  10-15 min     How long can you walk comfortably?  without boot: 30-60 min    Patient Stated Goals  improve pain, increase ankle stability, return to riding horses    Currently in Pain?  Yes    Pain Score  2    up to 4/10   Pain Location  Ankle    Pain Orientation  Right    Pain Descriptors / Indicators  Sore;Dull;Aching    Pain Type  Acute pain    Pain Onset  More than a month ago    Pain Frequency  Intermittent    Aggravating Factors   increased activity, turning/inversion    Pain Relieving Factors  ASO, rest, elevation, ice         [facility] PT Assessment - 10/01/18 1514      Assessment   Medical Diagnosis  S82.891A (ICD-10-CM) - Closed avulsion fracture of right ankle, initial encounter    Referring Provider (PT)  Gregor Hams, MD    Onset Date/Surgical Date  08/23/18    Hand Dominance  Right    Next MD Visit  10/22/2018    Prior Therapy  for Lt  ankle (1994-surgery)      Precautions   Precautions  None    Required Braces or Orthoses  Other Brace/Splint    Other Brace/Splint  boot; ASO      Restrictions   Weight Bearing Restrictions  No      Balance Screen   Has the patient fallen in the past 6 months  Yes    How many times?  1    Has the patient had a decrease in activity level because of a fear of falling?   Yes    Is the patient reluctant to leave their home because of a fear of falling?   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    Leisure  horse shows, boating, daily walking      Cognition   Overall Cognitive Status  Within Functional Limits for tasks assessed      Observation/Other Assessments   Focus on Therapeutic Outcomes (FOTO)   63 (37% limited; predicted 22% limited)      ROM / Strength   AROM / PROM / Strength  AROM;PROM;Strength      AROM   AROM Assessment Site  Ankle    Right/Left Ankle  Right;Left    Right Ankle Dorsiflexion  -10    Right Ankle Plantar Flexion  44    Right Ankle Inversion  8    Right Ankle Eversion  10    Left Ankle Dorsiflexion  2    Left Ankle Plantar Flexion  68    Left Ankle Inversion  44    Left Ankle Eversion  21      PROM   PROM Assessment Site  Ankle    Right/Left Ankle  Right    Right Ankle Dorsiflexion  2    Right Ankle Plantar Flexion  46    Right Ankle Inversion  20    Right Ankle Eversion  18      Strength   Overall Strength  Deficits    Overall Strength Comments  Rt ankle weakness; deferred MMT due to pain and limited ROM      Palpation   Palpation comment  tenderness Rt lateral malleolus and along achilles tendon      Ambulation/Gait   Assistive device  Other (Comment)   CAM boot   Gait Pattern  Decreased stance time - right;Decreased step length - left;Antalgic                Objective measurements completed on examination: See above findings.      [facility] Adult PT Treatment/Exercise - 10/01/18 1514      Exercises   Exercises  Ankle      Ankle Exercises: Stretches   Other Stretch  seated dorsiflexion ROM stretch x  10 sec hold      Ankle Exercises: Seated   ABC's  Other (comment)    ABC's Limitations  reviewed    Ankle Circles/Pumps  5 reps;Right   pumps/circles   Other Seated Ankle Exercises  inv/ev x 5 reps; Rt             PT Education - 10/01/18 1549    Education Details  HEP    Person(s) Educated  Patient    Methods  Explanation;Demonstration;Handout    Comprehension  Verbalized  understanding;Returned demonstration;Need further instruction          PT Long Term Goals - 10/01/18 1553      PT LONG TERM GOAL #1   Title  independent with HEP    Status  New    Target Date  11/12/18      PT LONG TERM GOAL #2   Title  FOTO score improved to </= 22% limited for improved function    Status  New    Target Date  11/12/18      PT LONG TERM GOAL #3   Title  Rt ankle AROM WNL without pain for improved function    Status  New    Target Date  11/12/18      PT LONG TERM GOAL #4   Title  demonstrate improved functional strength and balance with return to normal gait pattern and ability to perform light simulated horse showing activities for improved function    Status  New    Target Date  11/12/18      PT LONG TERM GOAL #5   Title  report pain < 2/10 with activity for improved function    Status  New    Target Date  11/12/18             Plan - 10/01/18 1550    Clinical Impression Statement  Pt is a 63 y/o female who presents to [place] for Rt ankle avulsion fx due to fall on 08/23/18.  Pt demonstrates decreased ROM and strength with gait abnormalities and increased pain.  Pt will benefit from PT to address deficits listed.    Personal Factors and Comorbidities  Comorbidity 1;Past/Current Experience    Comorbidities  hx prior sprains    Examination-Activity Limitations  Locomotion Level;Stairs;Stand    Examination-Participation Restrictions  Yard Work;Community Activity    Stability/Clinical Decision Making  Stable/Uncomplicated    Clinical Decision Making  Low    Rehab Potential  Good    PT Frequency  2x / week    PT Duration  6 weeks    PT Treatment/Interventions  ADLs/Self Care Home Management;Cryotherapy;Electrical Stimulation;Ultrasound;Moist Heat;Iontophoresis 63m/ml Dexamethasone;Gait training;Stair training;Functional mobility training;Therapeutic activities;Therapeutic exercise;Balance training;Patient/family education;Neuromuscular re-education;Manual  techniques;Passive range of motion;Taping;Vasopneumatic Device;Dry needling    PT Next Visit Plan  review HEP, progress to theraband exercises, gentle manual, modalities PRN for pain    PT Home Exercise Plan  Access Code: [email or web address]    Consulted and Agree with Plan of Care  Patient       Patient will benefit from skilled therapeutic intervention in order to improve the following deficits and impairments:  Abnormal gait, Decreased range of motion, Difficulty walking, Pain, Impaired flexibility, Hypomobility, Decreased balance, Decreased strength, Decreased mobility  Visit Diagnosis: Pain in right ankle and joints of right foot - Plan: PT plan of care cert/re-cert  Stiffness of right ankle, not elsewhere classified - Plan: PT plan of care cert/re-cert  Other abnormalities of gait and mobility - Plan:  PT plan of care cert/re-cert     Problem List Patient Active Problem List   Diagnosis Date Noted  . Colitis 04/09/2016  . Insomnia 02/23/2016  . Dyslipidemia 12/29/2014  . Carpal tunnel syndrome 12/29/2014  . Allergic rhinitis 10/13/2012  . Benign essential hypertension 10/13/2012  . Depression, major, single episode, complete remission ([place]) 05/07/2010      Laureen Abrahams, PT, DPT 10/01/18 3:58 PM    [facility] [place], [place], [address] Phone: [phone]   Fax:  [phone]  Name: Kristen Nichols MRN: [id number] Date of Birth: 1955-09-01

## 2018-10-03 ENCOUNTER — Telehealth: Payer: Self-pay

## 2018-10-03 ENCOUNTER — Other Ambulatory Visit: Payer: Self-pay

## 2018-10-03 DIAGNOSIS — K449 Diaphragmatic hernia without obstruction or gangrene: Secondary | ICD-10-CM

## 2018-10-03 DIAGNOSIS — K2100 Gastro-esophageal reflux disease with esophagitis, without bleeding: Secondary | ICD-10-CM

## 2018-10-03 DIAGNOSIS — K581 Irritable bowel syndrome with constipation: Secondary | ICD-10-CM

## 2018-10-03 DIAGNOSIS — K5730 Diverticulosis of large intestine without perforation or abscess without bleeding: Secondary | ICD-10-CM

## 2018-10-03 NOTE — Telephone Encounter (Signed)
Left message for patient to call back to the office;  

## 2018-10-05 ENCOUNTER — Other Ambulatory Visit: Payer: Self-pay | Admitting: Family Medicine

## 2018-10-06 NOTE — Telephone Encounter (Signed)
Pt calling back regarding this

## 2018-10-07 NOTE — Telephone Encounter (Signed)
Pt called back -- unable to transfer call.

## 2018-10-08 ENCOUNTER — Encounter: Payer: Self-pay | Admitting: Physical Therapy

## 2018-10-08 ENCOUNTER — Other Ambulatory Visit: Payer: Self-pay

## 2018-10-08 ENCOUNTER — Ambulatory Visit (INDEPENDENT_AMBULATORY_CARE_PROVIDER_SITE_OTHER): Payer: Federal, State, Local not specified - PPO | Admitting: Physical Therapy

## 2018-10-08 DIAGNOSIS — R2689 Other abnormalities of gait and mobility: Secondary | ICD-10-CM | POA: Diagnosis not present

## 2018-10-08 DIAGNOSIS — M25671 Stiffness of right ankle, not elsewhere classified: Secondary | ICD-10-CM

## 2018-10-08 DIAGNOSIS — M25571 Pain in right ankle and joints of right foot: Secondary | ICD-10-CM | POA: Diagnosis not present

## 2018-10-08 NOTE — Patient Instructions (Signed)
Access Code: [email or web address]  URL: [email or web address]  Date: 10/08/2018  Prepared by: Faustino Congress   Exercises  Seated Ankle Alphabet - 1 sets - 1 A-Z - 2x daily - 7x weekly  Seated Ankle Pumps on Table - 15 reps - 1 sets - 2x daily - 7x weekly  Seated Ankle Circles - 15 reps - 1 sets - 2x daily - 7x weekly  Seated Ankle Inversion Eversion AROM - 15 reps - 1 sets - 2x daily - 7x weekly  Seated Dorsiflexion Stretch - 3 reps - 1 sets - 10-15 sec hold - 2x daily - 7x weekly  Long Sitting Ankle Plantar Flexion with Resistance - 20 reps - 1 sets - 2x daily - 7x weekly  Seated Ankle Dorsiflexion with Resistance - 20 reps - 1 sets - 2x daily - 7x weekly  Seated Ankle Eversion with Resistance - 20 reps - 1 sets - 2x daily - 7x weekly  Patient Education  Trigger Point Dry Needling

## 2018-10-08 NOTE — Telephone Encounter (Signed)
Called and spoke with patient-patient reports she need to cancel her procedure scheduled with Dr. Bryan Lemma as she will be out of town; patient will be rescheduled for a later date/time for this procedure as the schedule becomes available;

## 2018-10-08 NOTE — Therapy (Signed)
[place], [place], [address] Phone: [phone]   Fax:  [phone]  Physical Therapy Treatment  Patient Details  Name: Kristen Nichols MRN: [id number] Date of Birth: 25-Mar-1955 Referring Provider (PT): Gregor Hams, MD   Encounter Date: 10/08/2018  PT End of Session - 10/08/18 1427    Visit Number  2    Number of Visits  12    Date for PT Re-Evaluation  11/12/18    PT Start Time  1346    PT Stop Time  1426    PT Time Calculation (min)  40 min    Activity Tolerance  Patient tolerated treatment well    Behavior During Therapy  [facility] for tasks assessed/performed       Past Medical History:  Diagnosis Date  . Allergy    Hayfever  . Anxiety   . Benign essential hypertension 10/13/2012   Last Assessment & Plan:  Well controlled.    . Colitis   . Diverticulosis   . GERD (gastroesophageal reflux disease)   . Headache(784.0)   . History of colon polyps   . Hypertension   . IBS (irritable bowel syndrome)   . UC (ulcerative colitis) [facility])     Past Surgical History:  Procedure Laterality Date  . ANKLE SURGERY    . BREAST BIOPSY    . BREAST CYST ASPIRATION    . CESAREAN SECTION    . COLONOSCOPY     has had 2 or 3 of them per patient. Last one was around 2016 with [facility] or maybe [facility]   . NASAL SINUS SURGERY      There were no vitals filed for this visit.  Subjective Assessment - 10/08/18 1347    Subjective  had a lot of pain yesterday; better today at a 2/10.  yesterday "it was a 10"  not wearing boot anymore    Limitations  Sitting;Standing;Walking    How long can you stand comfortably?  10-15 min    How long can you walk comfortably?  without boot: 30-60 min    Patient Stated Goals  improve pain, increase ankle stability, return to riding horses    Currently in Pain?  Yes    Pain Score  2     Pain Location  Ankle    Pain Orientation  Right    Pain Descriptors / Indicators   Sore;Aching;Dull    Pain Type  Acute pain    Pain Onset  More than a month ago    Pain Frequency  Intermittent    Aggravating Factors   increased activity, inversion    Pain Relieving Factors  ASO, rest, elevation, ice         [facility] PT Assessment - 10/08/18 1349      Assessment   Medical Diagnosis  S82.891A (ICD-10-CM) - Closed avulsion fracture of right ankle, initial encounter    Referring Provider (PT)  Gregor Hams, MD    Onset Date/Surgical Date  08/23/18    Hand Dominance  Right    Next MD Visit  10/22/2018    Prior Therapy  for Lt ankle (1994-surgery)                   [facility] Adult PT Treatment/Exercise - 10/08/18 1349      Manual Therapy   Manual Therapy  Joint mobilization    Joint Mobilization  IASTM to Rt peroneals      Ankle Exercises: Aerobic  Nustep  L5 x 5 min      Ankle Exercises: Seated   ABC's  1 rep    Ankle Circles/Pumps  20 reps   all motions     Ankle Exercises: Supine   T-Band  PF/DF/EV x 20 reps; red theraband; sitting      Ankle Exercises: Stretches   Other Stretch  seated dorsiflexion ROM stretch 5 x 20 sec hold      Ankle Exercises: Standing   SLS  3x20 sec RLE with ASO and light hand support       Trigger Point Dry Needling - 10/08/18 1427    Consent Given?  Yes    Education Handout Provided  Yes    Muscles Treated Lower Quadrant  Peroneals    Peroneals Response  Twitch response elicited;Palpable increased muscle length           PT Education - 10/08/18 1427    Education Details  HEP, DN    Person(s) Educated  Patient    Methods  Explanation;Demonstration;Handout    Comprehension  Verbalized understanding;Returned demonstration;Need further instruction          PT Long Term Goals - 10/01/18 1553      PT LONG TERM GOAL #1   Title  independent with HEP    Status  New    Target Date  11/12/18      PT LONG TERM GOAL #2   Title  FOTO score improved to </= 22% limited for improved function    Status  New     Target Date  11/12/18      PT LONG TERM GOAL #3   Title  Rt ankle AROM WNL without pain for improved function    Status  New    Target Date  11/12/18      PT LONG TERM GOAL #4   Title  demonstrate improved functional strength and balance with return to normal gait pattern and ability to perform light simulated horse showing activities for improved function    Status  New    Target Date  11/12/18      PT LONG TERM GOAL #5   Title  report pain < 2/10 with activity for improved function    Status  New    Target Date  11/12/18            Plan - 10/08/18 1428    Clinical Impression Statement  Pt tolerated session well today with positive response to DN and manual therapy today, decreased pain with inversion following DN.  Will continue to benefit from PT to maximize function.    Personal Factors and Comorbidities  Comorbidity 1;Past/Current Experience    Comorbidities  hx prior sprains    Examination-Activity Limitations  Locomotion Level;Stairs;Stand    Examination-Participation Restrictions  Yard Work;Community Activity    Stability/Clinical Decision Making  Stable/Uncomplicated    Rehab Potential  Good    PT Frequency  2x / week    PT Duration  6 weeks    PT Treatment/Interventions  ADLs/Self Care Home Management;Cryotherapy;Electrical Stimulation;Ultrasound;Moist Heat;Iontophoresis 32m/ml Dexamethasone;Gait training;Stair training;Functional mobility training;Therapeutic activities;Therapeutic exercise;Balance training;Patient/family education;Neuromuscular re-education;Manual techniques;Passive range of motion;Taping;Vasopneumatic Device;Dry needling    PT Next Visit Plan  assess response to DN, progress standing exercises    PT Home Exercise Plan  Access Code: [email or web address] Agree with Plan of Care  Patient       Patient will benefit from skilled therapeutic intervention in order to improve the  following deficits and impairments:  Abnormal gait, Decreased range  of motion, Difficulty walking, Pain, Impaired flexibility, Hypomobility, Decreased balance, Decreased strength, Decreased mobility  Visit Diagnosis: Stiffness of right ankle, not elsewhere classified  Pain in right ankle and joints of right foot  Other abnormalities of gait and mobility     Problem List Patient Active Problem List   Diagnosis Date Noted  . Colitis 04/09/2016  . Insomnia 02/23/2016  . Dyslipidemia 12/29/2014  . Carpal tunnel syndrome 12/29/2014  . Allergic rhinitis 10/13/2012  . Benign essential hypertension 10/13/2012  . Depression, major, single episode, complete remission ([place]) 05/07/2010      Laureen Abrahams, PT, DPT 10/08/18 2:30 PM     [place], [place], [address] Phone: [phone]   Fax:  [phone]  Name: Kristen Nichols MRN: [id number] Date of Birth: [birth year]

## 2018-10-14 ENCOUNTER — Ambulatory Visit: Payer: Federal, State, Local not specified - PPO

## 2018-10-14 ENCOUNTER — Other Ambulatory Visit: Payer: Self-pay

## 2018-10-14 ENCOUNTER — Ambulatory Visit (INDEPENDENT_AMBULATORY_CARE_PROVIDER_SITE_OTHER): Payer: Federal, State, Local not specified - PPO | Admitting: Physical Therapy

## 2018-10-14 ENCOUNTER — Encounter: Payer: Self-pay | Admitting: Physical Therapy

## 2018-10-14 DIAGNOSIS — R2689 Other abnormalities of gait and mobility: Secondary | ICD-10-CM

## 2018-10-14 DIAGNOSIS — M25571 Pain in right ankle and joints of right foot: Secondary | ICD-10-CM | POA: Diagnosis not present

## 2018-10-14 DIAGNOSIS — M25671 Stiffness of right ankle, not elsewhere classified: Secondary | ICD-10-CM

## 2018-10-14 NOTE — Therapy (Signed)
[place], [place], [address] Phone: [phone]   Fax:  [phone]  Physical Therapy Treatment  Patient Details  Name: Kristen Nichols MRN: [id number] Date of Birth: Jun 30, 1955 Referring Provider (PT): Gregor Hams, MD   Encounter Date: 10/14/2018  PT End of Session - 10/14/18 1156    Visit Number  3    Number of Visits  12    Date for PT Re-Evaluation  11/12/18    PT Start Time  8768    PT Stop Time  1235    PT Time Calculation (min)  50 min    Activity Tolerance  Patient tolerated treatment well    Behavior During Therapy  [facility] for tasks assessed/performed       Past Medical History:  Diagnosis Date  . Allergy    Hayfever  . Anxiety   . Benign essential hypertension 10/13/2012   Last Assessment & Plan:  Well controlled.    . Colitis   . Diverticulosis   . GERD (gastroesophageal reflux disease)   . Headache(784.0)   . History of colon polyps   . Hypertension   . IBS (irritable bowel syndrome)   . UC (ulcerative colitis) [facility])     Past Surgical History:  Procedure Laterality Date  . ANKLE SURGERY    . BREAST BIOPSY    . BREAST CYST ASPIRATION    . CESAREAN SECTION    . COLONOSCOPY     has had 2 or 3 of them per patient. Last one was around 2016 with [facility] or maybe [facility]   . NASAL SINUS SURGERY      There were no vitals filed for this visit.  Subjective Assessment - 10/14/18 1156    Subjective  Pt reports she did a lot of walking Saturday (10am-4pm) with brace on.  Sunday had some increased swelling.  DN helped loosen up leg.    Currently in Pain?  Yes    Pain Score  1     Pain Location  Ankle    Pain Orientation  Right    Pain Descriptors / Indicators  Aching;Dull    Aggravating Factors   increased activity; inversion    Pain Relieving Factors  rest, elevation, ASO         [facility] PT Assessment - 10/14/18 0001      Assessment   Medical Diagnosis  S82.891A  (ICD-10-CM) - Closed avulsion fracture of right ankle, initial encounter    Referring Provider (PT)  Gregor Hams, MD    Onset Date/Surgical Date  08/23/18    Hand Dominance  Right    Next MD Visit  10/22/2018    Prior Therapy  for Lt ankle (1994-surgery)      AROM   Right/Left Ankle  Right    Right Ankle Dorsiflexion  6    Right Ankle Plantar Flexion  40    Right Ankle Inversion  11    Right Ankle Eversion  23       [facility] Adult PT Treatment/Exercise - 10/14/18 0001      Modalities   Modalities  Vasopneumatic      Vasopneumatic   Number Minutes Vasopneumatic   10 minutes    Vasopnuematic Location   Ankle    Vasopneumatic Pressure  Medium    Vasopneumatic Temperature   34 deg       Manual Therapy   Manual Therapy  Taping    Kinesiotex  Edema  Kinesiotix   Edema  I strip of reg [place] tape to Rt ant/lateral ankle to decompress tissue, decrease pain, and increase proprioception      Ankle Exercises: Aerobic   Nustep  L5 x 5 min      Ankle Exercises: Stretches   Gastroc Stretch  2 reps;30 seconds   runners stretch     Ankle Exercises: Supine   T-Band  DF/EV/INV x 10 reps, 2 sets; red theraband; long sitting      Ankle Exercises: Standing   SLS  Rt SLS x 15 sec x 3 reps without UE support   no brace   Other Standing Ankle Exercises  Step down (LLE) and retro step up on 3" step with RLE x 10, UE support on rail     Other Standing Ankle Exercises  Rt ankle circles x 5 reps, in between standing exercises (ie: SLS, runners stretch)             PT Education - 10/14/18 1235    Education Details  kinesiotape info    Person(s) Educated  Patient    Methods  Explanation;Handout    Comprehension  Verbalized understanding          PT Long Term Goals - 10/01/18 1553      PT LONG TERM GOAL #1   Title  independent with HEP    Status  New    Target Date  11/12/18      PT LONG TERM GOAL #2   Title  FOTO score improved to </= 22% limited for improved function     Status  New    Target Date  11/12/18      PT LONG TERM GOAL #3   Title  Rt ankle AROM WNL without pain for improved function    Status  New    Target Date  11/12/18      PT LONG TERM GOAL #4   Title  demonstrate improved functional strength and balance with return to normal gait pattern and ability to perform light simulated horse showing activities for improved function    Status  New    Target Date  11/12/18      PT LONG TERM GOAL #5   Title  report pain < 2/10 with activity for improved function    Status  New    Target Date  11/12/18            Plan - 10/14/18 1234    Clinical Impression Statement  Good improvement in Rt ankle ROM.  Pt tolerated all exercises well, however reported increased soreness at end of session.  Soreness reduced with use of vaso.  Pt progressing towards goals.    Personal Factors and Comorbidities  Comorbidity 1;Past/Current Experience    Comorbidities  hx prior sprains    Examination-Activity Limitations  Locomotion Level;Stairs;Stand    Examination-Participation Restrictions  Yard Work;Community Activity    Stability/Clinical Decision Making  Stable/Uncomplicated    Rehab Potential  Good    PT Frequency  2x / week    PT Duration  6 weeks    PT Treatment/Interventions  ADLs/Self Care Home Management;Cryotherapy;Electrical Stimulation;Ultrasound;Moist Heat;Iontophoresis 69m/ml Dexamethasone;Gait training;Stair training;Functional mobility training;Therapeutic activities;Therapeutic exercise;Balance training;Patient/family education;Neuromuscular re-education;Manual techniques;Passive range of motion;Taping;Vasopneumatic Device;Dry needling    PT Next Visit Plan  continue progressive ankle ROM/strengthing exercises; gait training    PT Home Exercise Plan  Access Code: [email or web address]    Consulted and Agree with Plan of Care  Patient  Patient will benefit from skilled therapeutic intervention in order to improve the following deficits and  impairments:  Abnormal gait, Decreased range of motion, Difficulty walking, Pain, Impaired flexibility, Hypomobility, Decreased balance, Decreased strength, Decreased mobility  Visit Diagnosis: Stiffness of right ankle, not elsewhere classified  Pain in right ankle and joints of right foot  Other abnormalities of gait and mobility     Problem List Patient Active Problem List   Diagnosis Date Noted  . Colitis 04/09/2016  . Insomnia 02/23/2016  . Dyslipidemia 12/29/2014  . Carpal tunnel syndrome 12/29/2014  . Allergic rhinitis 10/13/2012  . Benign essential hypertension 10/13/2012  . Depression, major, single episode, complete remission ([place]) 05/07/2010   Kerin Perna, PTA 10/14/18 12:41 PM  [place], [place], [address] Phone: [phone]   Fax:  [phone]  Name: Kristen Nichols MRN: [id number] Date of Birth: [birth year]

## 2018-10-14 NOTE — Patient Instructions (Signed)
Kinesiology tape What is kinesiology tape?  There are many brands of kinesiology tape.  KTape, [employer], [employer], Dynamic tape, to name a few. It is an elasticized tape designed to support the body's natural healing process. This tape provides stability and support to muscles and joints without restricting motion. It can also help decrease swelling in the area of application. How does it work? The tape microscopically lifts and decompresses the skin to allow for drainage of lymph (swelling) to flow away from area, reducing inflammation.  The tape has the ability to help re-educate the neuromuscular system by targeting specific receptors in the skin.  The presence of the tape increases the body's awareness of posture and body mechanics.  Do not use with: . Open wounds . Skin lesions . Adhesive allergies Safe removal of the tape: In some rare cases, mild/moderate skin irritation can occur.  This can include redness, itchiness, or hives. If this occurs, immediately remove tape and consult your primary care physician if symptoms are severe or do not resolve within 2 days.  To remove tape safely, hold nearby skin with one hand and gentle roll tape down with other hand.  You can apply oil or conditioner to tape while in shower prior to removal to loosen adhesive.  DO NOT swiftly rip tape off like a band-aid, as this could cause skin tears and additional skin irritation.    * compression sock will help with swelling

## 2018-10-17 ENCOUNTER — Other Ambulatory Visit: Payer: Self-pay

## 2018-10-17 ENCOUNTER — Ambulatory Visit (INDEPENDENT_AMBULATORY_CARE_PROVIDER_SITE_OTHER): Payer: Federal, State, Local not specified - PPO | Admitting: Physical Therapy

## 2018-10-17 DIAGNOSIS — R2689 Other abnormalities of gait and mobility: Secondary | ICD-10-CM | POA: Diagnosis not present

## 2018-10-17 DIAGNOSIS — M25671 Stiffness of right ankle, not elsewhere classified: Secondary | ICD-10-CM

## 2018-10-17 DIAGNOSIS — M25571 Pain in right ankle and joints of right foot: Secondary | ICD-10-CM

## 2018-10-17 NOTE — Therapy (Signed)
[place], [place], [address] Phone: [phone]   Fax:  [phone]  Physical Therapy Treatment  Patient Details  Name: Kristen Nichols MRN: [id number] Date of Birth: Feb 26, 1955 Referring Provider (PT): Gregor Hams, MD   Encounter Date: 10/17/2018  PT End of Session - 10/17/18 1452    Visit Number  4    Number of Visits  12    Date for PT Re-Evaluation  11/12/18    PT Start Time  1400    PT Stop Time  1450    PT Time Calculation (min)  50 min    Activity Tolerance  Patient tolerated treatment well    Behavior During Therapy  [facility] for tasks assessed/performed       Past Medical History:  Diagnosis Date  . Allergy    Hayfever  . Anxiety   . Benign essential hypertension 10/13/2012   Last Assessment & Plan:  Well controlled.    . Colitis   . Diverticulosis   . GERD (gastroesophageal reflux disease)   . Headache(784.0)   . History of colon polyps   . Hypertension   . IBS (irritable bowel syndrome)   . UC (ulcerative colitis) [facility])     Past Surgical History:  Procedure Laterality Date  . ANKLE SURGERY    . BREAST BIOPSY    . BREAST CYST ASPIRATION    . CESAREAN SECTION    . COLONOSCOPY     has had 2 or 3 of them per patient. Last one was around 2016 with [facility] or maybe [facility]   . NASAL SINUS SURGERY      There were no vitals filed for this visit.  Subjective Assessment - 10/17/18 1453    Subjective  Pt states that her ankle was sore the remainder of day of last session, but next day it felt fine.  She ordered a compression sock to wear; it will arrive today. She is anxious to wean out of ankle brace.    Currently in Pain?  No/denies    Pain Score  0-No pain         [facility] PT Assessment - 10/17/18 0001      Assessment   Medical Diagnosis  S82.891A (ICD-10-CM) - Closed avulsion fracture of right ankle, initial encounter    Referring Provider (PT)  Gregor Hams, MD     Onset Date/Surgical Date  08/23/18    Hand Dominance  Right    Next MD Visit  10/22/2018    Prior Therapy  for Lt ankle (1994-surgery)       [facility] Adult PT Treatment/Exercise - 10/17/18 0001      Ambulation/Gait   Ambulation Distance (Feet)  320 Feet    Assistive device  None    Gait Pattern  Step-through pattern;Decreased weight shift to right    Pre-Gait Activities  toe off to foot flat (x10 x 2) with leg extended to warm up and increase extensibility of tissue    Gait Comments  VC to slow down and roll through Rt toes and shift weight evenly between feet; improved with repetition.       Vasopneumatic   Number Minutes Vasopneumatic   10 minutes    Vasopnuematic Location   Ankle    Vasopneumatic Pressure  Medium    Vasopneumatic Temperature   34 deg       Manual Therapy   Manual Therapy  Taping;Soft tissue mobilization;Joint mobilization;Passive ROM;Myofascial release  Manual therapy comments  pt in supine and Lt sidelying    Joint Mobilization  gentle mobilization between rays of Rt ft     Soft tissue mobilization  IASTM and STM to Rt ankle and shin    Myofascial Release  MFR to Rt ant tib and ext digitorus longus     Passive ROM  Flex/ext of MTP joint of toes; gentle eversion/ inv       Kinesiotix   Edema  I strip of reg Rock tape to Rt ant/lateral ankle to prox fibularis,I strip across dosum on Rt foot: to decompress tissue, decrease pain, and increase proprioception      Ankle Exercises: Standing   Other Standing Ankle Exercises  Step down (LLE) and retro step up on 3" step with RLE x 8, UE support on rail     Other Standing Ankle Exercises  tandem stance x 30 sec; tandem walk forward/backward x 10 ft x 2 reps                   PT Long Term Goals - 10/01/18 1553      PT LONG TERM GOAL #1   Title  independent with HEP    Status  New    Target Date  11/12/18      PT LONG TERM GOAL #2   Title  FOTO score improved to </= 22% limited for improved function     Status  New    Target Date  11/12/18      PT LONG TERM GOAL #3   Title  Rt ankle AROM WNL without pain for improved function    Status  New    Target Date  11/12/18      PT LONG TERM GOAL #4   Title  demonstrate improved functional strength and balance with return to normal gait pattern and ability to perform light simulated horse showing activities for improved function    Status  New    Target Date  11/12/18      PT LONG TERM GOAL #5   Title  report pain < 2/10 with activity for improved function    Status  New    Target Date  11/12/18            Plan - 10/17/18 1454    Clinical Impression Statement  Pt reporting less sensitivity with IASTM than last session; continued palpable tightness along lateral gastroc, fibularis muscles, and ant tib. Gait quality improving with cues. Pt progressing well towards established therapy goals.    Personal Factors and Comorbidities  Comorbidity 1;Past/Current Experience    Comorbidities  hx prior sprains    Examination-Activity Limitations  Locomotion Level;Stairs;Stand    Examination-Participation Restrictions  Yard Work;Community Activity    Stability/Clinical Decision Making  Stable/Uncomplicated    Rehab Potential  Good    PT Frequency  2x / week    PT Duration  6 weeks    PT Treatment/Interventions  ADLs/Self Care Home Management;Cryotherapy;Electrical Stimulation;Ultrasound;Moist Heat;Iontophoresis 23m/ml Dexamethasone;Gait training;Stair training;Functional mobility training;Therapeutic activities;Therapeutic exercise;Balance training;Patient/family education;Neuromuscular re-education;Manual techniques;Passive range of motion;Taping;Vasopneumatic Device;Dry needling    PT Next Visit Plan  continue progressive ankle ROM/strengthing exercises; gait training    PT Home Exercise Plan  Access Code: [email or web address]    Consulted and Agree with Plan of Care  Patient       Patient will benefit from skilled therapeutic intervention in order to  improve the following deficits and impairments:  Abnormal gait, Decreased range of motion, Difficulty  walking, Pain, Impaired flexibility, Hypomobility, Decreased balance, Decreased strength, Decreased mobility  Visit Diagnosis: Stiffness of right ankle, not elsewhere classified  Pain in right ankle and joints of right foot  Other abnormalities of gait and mobility     Problem List Patient Active Problem List   Diagnosis Date Noted  . Colitis 04/09/2016  . Insomnia 02/23/2016  . Dyslipidemia 12/29/2014  . Carpal tunnel syndrome 12/29/2014  . Allergic rhinitis 10/13/2012  . Benign essential hypertension 10/13/2012  . Depression, major, single episode, complete remission ([place]) 05/07/2010   Kerin Perna, PTA 10/17/18 3:15 PM  [facility] [place], [place], [address] Phone: [phone]   Fax:  [phone]  Name: Kristen Nichols MRN: [id number] Date of Birth: 04/19/1955

## 2018-10-20 ENCOUNTER — Encounter: Payer: Self-pay | Admitting: Physical Therapy

## 2018-10-20 ENCOUNTER — Ambulatory Visit (INDEPENDENT_AMBULATORY_CARE_PROVIDER_SITE_OTHER): Payer: Federal, State, Local not specified - PPO | Admitting: Physical Therapy

## 2018-10-20 ENCOUNTER — Other Ambulatory Visit: Payer: Self-pay

## 2018-10-20 DIAGNOSIS — M25671 Stiffness of right ankle, not elsewhere classified: Secondary | ICD-10-CM

## 2018-10-20 DIAGNOSIS — M25571 Pain in right ankle and joints of right foot: Secondary | ICD-10-CM

## 2018-10-20 DIAGNOSIS — R2689 Other abnormalities of gait and mobility: Secondary | ICD-10-CM

## 2018-10-20 NOTE — Therapy (Signed)
[place], [place], [address] Phone: [phone]   Fax:  [phone]  Physical Therapy Treatment  Patient Details  Name: Kristen Nichols MRN: [id number] Date of Birth: 1955-08-13 Referring Provider (PT): Gregor Hams, MD   Encounter Date: 10/20/2018  PT End of Session - 10/20/18 1438    Visit Number  5    Number of Visits  12    Date for PT Re-Evaluation  11/12/18    PT Start Time  1430    PT Stop Time  1527    PT Time Calculation (min)  57 min    Activity Tolerance  Patient tolerated treatment well    Behavior During Therapy  [facility] for tasks assessed/performed       Past Medical History:  Diagnosis Date  . Allergy    Hayfever  . Anxiety   . Benign essential hypertension 10/13/2012   Last Assessment & Plan:  Well controlled.    . Colitis   . Diverticulosis   . GERD (gastroesophageal reflux disease)   . Headache(784.0)   . History of colon polyps   . Hypertension   . IBS (irritable bowel syndrome)   . UC (ulcerative colitis) [facility])     Past Surgical History:  Procedure Laterality Date  . ANKLE SURGERY    . BREAST BIOPSY    . BREAST CYST ASPIRATION    . CESAREAN SECTION    . COLONOSCOPY     has had 2 or 3 of them per patient. Last one was around 2016 with [facility] or maybe [facility]   . NASAL SINUS SURGERY      There were no vitals filed for this visit.  Subjective Assessment - 10/20/18 1432    Subjective  Pt states that she had some pain yesterday due to the rain. Pt states that the compression socks have been helpful in managing the swelling around her ankle.    Limitations  Sitting;Standing;Walking    Currently in Pain?  No/denies         [facility] PT Assessment - 10/20/18 0001      Assessment   Medical Diagnosis  S82.891A (ICD-10-CM) - Closed avulsion fracture of right ankle, initial encounter    Referring Provider (PT)  Gregor Hams, MD    Onset Date/Surgical Date   08/23/18    Hand Dominance  Right    Next MD Visit  10/22/2018    Prior Therapy  for Lt ankle (1994-surgery)                   [facility] Adult PT Treatment/Exercise - 10/20/18 1516      Ambulation/Gait   Pre-Gait Activities  toe off to foot flat 2 sets of 10 with leg exteneded for warm up and increase extensibility of tissues       Manual Therapy   Joint Mobilization  grade 2 AP talocrural       Ankle Exercises: Seated   BAPS  Sitting;Level 3;10 reps   PF/DF, IV/EV, CW/CCW   Other Seated Ankle Exercises  DF, IV, EV x 10 reps    red band     Ankle Exercises: Stretches   Soleus Stretch  2 reps;20 seconds    Gastroc Stretch  20 seconds;2 reps      Ankle Exercises: Standing   SLS  3 x 30sec   Airex pad w/ use of banister for balance support   Other Standing Ankle Exercises  Step down (LLE)  and retro step up 3" with RLE x 10 UE support on rail      Ankle Exercises: Aerobic   Nustep  L5 x 106mn    Other Aerobic  2 laps around gym for warm up                  PT Long Term Goals - 10/01/18 1553      PT LONG TERM GOAL #1   Title  independent with HEP    Status  New    Target Date  11/12/18      PT LONG TERM GOAL #2   Title  FOTO score improved to </= 22% limited for improved function    Status  New    Target Date  11/12/18      PT LONG TERM GOAL #3   Title  Rt ankle AROM WNL without pain for improved function    Status  New    Target Date  11/12/18      PT LONG TERM GOAL #4   Title  demonstrate improved functional strength and balance with return to normal gait pattern and ability to perform light simulated horse showing activities for improved function    Status  New    Target Date  11/12/18      PT LONG TERM GOAL #5   Title  report pain < 2/10 with activity for improved function    Status  New    Target Date  11/12/18            Plan - 10/20/18 1539    Clinical Impression Statement  Pt ROM in R ankle has improved with more funcitonal  use. Pt gait has improved with more MTP extension during heel off. Pt reported more ease with ascending retrograde 3" step and tolerated more reps. Some discomfort reported while performing ankle inversion/eversion and became fatigued with CW/CCW motion.Pt making great gains towards all goals.    Personal Factors and Comorbidities  Comorbidity 1;Past/Current Experience    Examination-Activity Limitations  Locomotion Level;Stairs;Stand    Examination-Participation Restrictions  Yard Work;Community Activity    Stability/Clinical Decision Making  Stable/Uncomplicated    Clinical Decision Making  Low    Rehab Potential  Poor    PT Frequency  2x / week    PT Duration  6 weeks    PT Treatment/Interventions  ADLs/Self Care Home Management;Cryotherapy;Electrical Stimulation;Ultrasound;Moist Heat;Iontophoresis 454mml Dexamethasone;Gait training;Stair training;Functional mobility training;Therapeutic activities;Therapeutic exercise;Balance training;Patient/family education;Neuromuscular re-education;Manual techniques;Passive range of motion;Taping;Vasopneumatic Device;Dry needling    PT Next Visit Plan  Continue with progressive ankle exercises with addition of quad stretch and foot exercises for MTP hyprextension.    PT Home Exercise Plan  Access Code: [email or web address]    Consulted and Agree with Plan of Care  Patient       Patient will benefit from skilled therapeutic intervention in order to improve the following deficits and impairments:  Abnormal gait, Decreased range of motion, Difficulty walking, Pain, Impaired flexibility, Hypomobility, Decreased balance, Decreased strength, Decreased mobility  Visit Diagnosis: Stiffness of right ankle, not elsewhere classified  Pain in right ankle and joints of right foot  Other abnormalities of gait and mobility     Problem List Patient Active Problem List   Diagnosis Date Noted  . Colitis 04/09/2016  . Insomnia 02/23/2016  . Dyslipidemia 12/29/2014  .  Carpal tunnel syndrome 12/29/2014  . Allergic rhinitis 10/13/2012  . Benign essential hypertension 10/13/2012  . Depression, major, single episode, complete remission ([place]04/29/2012  Gardiner Rhyme, SPTA 10/20/2018, 5:08 PM  Kerin Perna, PTA 10/20/18 5:16 PM  [facility] [place], [place], [address] Phone: [phone]   Fax:  [phone]  Name: Kristen Nichols MRN: [id number] Date of Birth: 01-14-1955

## 2018-10-22 ENCOUNTER — Encounter: Payer: Self-pay | Admitting: Physical Therapy

## 2018-10-22 ENCOUNTER — Ambulatory Visit (INDEPENDENT_AMBULATORY_CARE_PROVIDER_SITE_OTHER): Payer: Federal, State, Local not specified - PPO | Admitting: Family Medicine

## 2018-10-22 ENCOUNTER — Encounter: Payer: Self-pay | Admitting: Family Medicine

## 2018-10-22 ENCOUNTER — Other Ambulatory Visit: Payer: Self-pay

## 2018-10-22 ENCOUNTER — Ambulatory Visit (INDEPENDENT_AMBULATORY_CARE_PROVIDER_SITE_OTHER): Payer: Federal, State, Local not specified - PPO | Admitting: Physical Therapy

## 2018-10-22 VITALS — BP 139/75 | HR 91 | Temp 98.4°F | Wt 149.0 lb

## 2018-10-22 DIAGNOSIS — R2689 Other abnormalities of gait and mobility: Secondary | ICD-10-CM

## 2018-10-22 DIAGNOSIS — M25571 Pain in right ankle and joints of right foot: Secondary | ICD-10-CM

## 2018-10-22 DIAGNOSIS — M25671 Stiffness of right ankle, not elsewhere classified: Secondary | ICD-10-CM

## 2018-10-22 DIAGNOSIS — S82891A Other fracture of right lower leg, initial encounter for closed fracture: Secondary | ICD-10-CM

## 2018-10-22 MED ORDER — HYDROCHLOROTHIAZIDE 25 MG PO TABS: 25.0000 mg | ORAL_TABLET | Freq: Every day | ORAL | 1 refills | Status: DC

## 2018-10-22 NOTE — Therapy (Signed)
[place], [place], [address] Phone: [phone]   Fax:  [phone]  Physical Therapy Treatment  Patient Details  Name: Kristen Nichols MRN: [id number] Date of Birth: 01/19/1955 Referring Provider (PT): Gregor Hams, MD   Encounter Date: 10/22/2018  PT End of Session - 10/22/18 1006    Visit Number  6    Number of Visits  12    Date for PT Re-Evaluation  11/12/18    PT Start Time  1007    PT Stop Time  1052    PT Time Calculation (min)  45 min    Activity Tolerance  Patient tolerated treatment well    Behavior During Therapy  [facility] for tasks assessed/performed       Past Medical History:  Diagnosis Date  . Allergy    Hayfever  . Anxiety   . Benign essential hypertension 10/13/2012   Last Assessment & Plan:  Well controlled.    . Colitis   . Diverticulosis   . GERD (gastroesophageal reflux disease)   . Headache(784.0)   . History of colon polyps   . Hypertension   . IBS (irritable bowel syndrome)   . UC (ulcerative colitis) [facility])     Past Surgical History:  Procedure Laterality Date  . ANKLE SURGERY    . BREAST BIOPSY    . BREAST CYST ASPIRATION    . CESAREAN SECTION    . COLONOSCOPY     has had 2 or 3 of them per patient. Last one was around 2016 with [facility] or maybe [facility]   . NASAL SINUS SURGERY      There were no vitals filed for this visit.  Subjective Assessment - 10/22/18 1008    Subjective  Pt stated that the doctor has allowed her to return to riding her horses again with brace. Pt reported some stiffness in R ankle.    Limitations  Sitting;Standing;Walking    Currently in Pain?  No/denies         [facility] PT Assessment - 10/22/18 0001      AROM   Right/Left Ankle  Right;Left    Right Ankle Dorsiflexion  11    Right Ankle Plantar Flexion  34    Right Ankle Inversion  20    Right Ankle Eversion  12    Left Ankle Dorsiflexion  10       [facility] Adult PT  Treatment/Exercise - 10/22/18 0001      Ankle Exercises: Aerobic   Nustep  L5 x 54mn      Ankle Exercises: Standing   Heel Raises  10 reps; UE support on railing  VCs for technique   Other Standing Ankle Exercises  Sit to stand 10 reps, no UE support (varied foot positions - even feet, Lt/Rt staggered step) , slow eccentric lowering    Other Standing Ankle Exercises  Trial of high kneeling to standing: Rt/Lt foot forward, kneeling to 3" pad with UE support on black mat tables to push up into standing - painful; stopped.     Ankle Exercises: Stretches   Gastroc Stretch  20 seconds;2 reps,  Heel off step      Ankle Exercises: Seated   Ankle Circles/Pumps  AROM;Right;5 reps; inversion/eversion x 5 reps each     Towel Crunch  Other (comment)   10 reps RL    Verbally reviewed standing quad stretch and variations; pt verbalized understanding.   Modalities:  Vasopneumatic to Rt ankle  x 10 min with medium pressure for pain / swelling relief.   Education:  Pt educated on HEP; Physiological scientist.  Verbalized understanding.      PT Long Term Goals - 10/01/18 1553      PT LONG TERM GOAL #1   Title  independent with HEP    Status  New    Target Date  11/12/18      PT LONG TERM GOAL #2   Title  FOTO score improved to </= 22% limited for improved function    Status  New    Target Date  11/12/18      PT LONG TERM GOAL #3   Title  Rt ankle AROM WNL without pain for improved function    Status  New    Target Date  11/12/18      PT LONG TERM GOAL #4   Title  demonstrate improved functional strength and balance with return to normal gait pattern and ability to perform light simulated horse showing activities for improved function    Status  New    Target Date  11/12/18      PT LONG TERM GOAL #5   Title  report pain < 2/10 with activity for improved function    Status  New    Target Date  11/12/18            Plan - 10/22/18 1053    Clinical Impression Statement  Pt initially had  difficulty tolerating sit to stand exercise; with minor modification, could complete painfree and with good form. Pt unable to tolerate trial standing to/from high kneeling due to increased discomfort in R ankle. Pt making great gains towards stated PT goals.    Comorbidities  hx prior sprains    Examination-Activity Limitations  Locomotion Level;Stairs;Stand    Examination-Participation Restrictions  Yard Work;Community Activity    Rehab Potential  Poor    PT Frequency  2x / week    PT Duration  6 weeks    PT Treatment/Interventions  ADLs/Self Care Home Management;Cryotherapy;Electrical Stimulation;Ultrasound;Moist Heat;Iontophoresis 88m/ml Dexamethasone;Gait training;Stair training;Functional mobility training;Therapeutic activities;Therapeutic exercise;Balance training;Patient/family education;Neuromuscular re-education;Manual techniques;Passive range of motion;Taping;Vasopneumatic Device;Dry needling    PT Next Visit Plan  Continue with progressive ankle exercises to pt tolerance; include exercises to assist with return to horseback riding.     PT Home Exercise Plan  Access Code: [email or web address]    Consulted and Agree with Plan of Care  Patient       Patient will benefit from skilled therapeutic intervention in order to improve the following deficits and impairments:  Abnormal gait, Decreased range of motion, Difficulty walking, Pain, Impaired flexibility, Hypomobility, Decreased balance, Decreased strength, Decreased mobility  Visit Diagnosis: Stiffness of right ankle, not elsewhere classified  Pain in right ankle and joints of right foot  Other abnormalities of gait and mobility     Problem List Patient Active Problem List   Diagnosis Date Noted  . Colitis 04/09/2016  . Insomnia 02/23/2016  . Dyslipidemia 12/29/2014  . Carpal tunnel syndrome 12/29/2014  . Allergic rhinitis 10/13/2012  . Benign essential hypertension 10/13/2012  . Depression, major, single episode, complete remission  ([place] 05/07/2010    KGardiner Rhyme SPTA 10/22/2018, 11:45 AM   JKerin Perna PTA 10/22/18 2:12 PM   [facility] [place] [address]Phone: [phone]  Fax:  [phone] Name: Kristen BAUMGARNERMRN: [id number] of Birth: 124-Mar-1957

## 2018-10-22 NOTE — Progress Notes (Signed)
Kristen Nichols is a 63 y.o. female who presents to [facility] today for follow-up ankle fracture.  Patient suffered right ankle lateral malleolus avulsion fracture on August 15.  She had more pain than was expected and initial management despite excellent use of cam walker boot and limited to no weightbearing.  She last was assessed on September 15 and at that visit had had some improvement.  She was referred to physical therapy and has had 5 sessions of physical therapy most recently on October 12.  She notes she is had significant improvement in symptoms.  Overall she feels a lot better.  She still has a little bit of soreness.  She is interested in returning to horse riding.  She thinks that she can do it safely.  ROS:  As above  Exam:  BP 139/75   Pulse 91   Temp 98.4 F (36.9 C) (Oral)   Wt 149 lb (67.6 kg)   BMI 27.25 kg/m  Wt Readings from Last 5 Encounters:  10/22/18 149 lb (67.6 kg)  09/24/18 150 lb (68 kg)  09/11/18 151 lb (68.5 kg)  08/26/18 147 lb 1.9 oz (66.7 kg)  06/30/18 152 lb (68.9 kg)   General: Well Developed, well nourished, and in no acute distress.  Neuro/Psych: Alert and oriented x3, extra-ocular muscles intact, able to move all 4 extremities, sensation grossly intact. Skin: Warm and dry, no rashes noted.  Respiratory: Not using accessory muscles, speaking in full sentences, trachea midline.  Cardiovascular: Pulses palpable, no extremity edema. Abdomen: Does not appear distended. MSK: Right ankle relatively normal-appearing with no significant swelling or skin change. Mildly tender palpation lateral malleolus.  Improved ankle motion slight limitation in dorsiflexion plantarflexion.  Intact strength.  Pulses cap refill and sensation are intact distally.      Assessment and Plan: 63 y.o. female with ankle avulsion fracture.  Doing quite well.  Plan finish out physical therapy course.  Plan to proceed with  home exercise program.  If needed I am happy to extend PT course.  Plan to continue ASO brace or transition to compressive ankle sleeve.  Okay to resume graduated return to horse riding with a focus on safety.  If all is well recheck as needed.  Recommend follow-up new PCP in about 6 months or sooner if needed.  Chronic medications refilled.  I spent 15 minutes with this patient, greater than 50% was face-to-face time counseling regarding home exercise program and return to horse riding.Marland Kitchen  PDMP not reviewed this encounter. No orders of the defined types were placed in this encounter.  Meds ordered this encounter  Medications  . hydrochlorothiazide (HYDRODIURIL) 25 MG tablet    Sig: Take 1 tablet (25 mg total) by mouth daily.    Dispense:  90 tablet    Refill:  1    Historical information moved to improve visibility of documentation.  Past Medical History:  Diagnosis Date  . Allergy    Hayfever  . Anxiety   . Benign essential hypertension 10/13/2012   Last Assessment & Plan:  Well controlled.    . Colitis   . Diverticulosis   . GERD (gastroesophageal reflux disease)   . Headache(784.0)   . History of colon polyps   . Hypertension   . IBS (irritable bowel syndrome)   . UC (ulcerative colitis) [facility])    Past Surgical History:  Procedure Laterality Date  . ANKLE SURGERY    . BREAST BIOPSY    .  BREAST CYST ASPIRATION    . CESAREAN SECTION    . COLONOSCOPY     has had 2 or 3 of them per patient. Last one was around 2016 with [facility] or maybe [facility]   . NASAL SINUS SURGERY     Social History   Tobacco Use  . Smoking status: Former Smoker    Packs/day: 0.50    Years: 20.00    Pack years: 10.00    Types: Cigarettes    Quit date: 01/09/2016    Years since quitting: 2.7  . Smokeless tobacco: Never Used  Substance Use Topics  . Alcohol use: Yes    Alcohol/week: 2.0 standard drinks    Types: 2 Cans of beer per week   family history includes Diabetes in her maternal  grandmother; Other in her father and mother.  Medications: Current Outpatient Medications  Medication Sig Dispense Refill  . buPROPion (WELLBUTRIN XL) 150 MG 24 hr tablet TAKE 1 TABLET (150 MG TOTAL) BY MOUTH DAILY. DUE FOR FOLLOW UP VISIT W/PCP 90 tablet 1  . diclofenac sodium (VOLTAREN) 1 % GEL Apply 4 g topically 4 (four) times daily. To affected joint. 100 g 11  . fexofenadine (ALLEGRA) 180 MG tablet Take 180 mg by mouth daily as needed for allergies or rhinitis.    . Fluoxetine HCl, PMDD, 20 MG TABS TAKE 1 TABLET (20 MG TOTAL) BY MOUTH DAILY. DUE FOR FOLLOW UP VISIT W/PCP 90 tablet 1  . fluticasone (FLONASE) 50 MCG/ACT nasal spray SPRAY 2 SPRAYS INTO EACH NOSTRIL EVERY DAY 16 g 0  . hydrochlorothiazide (HYDRODIURIL) 25 MG tablet Take 1 tablet (25 mg total) by mouth daily. 90 tablet 1  . ibuprofen (ADVIL,MOTRIN) 200 MG tablet Take 600-800 mg by mouth every 6 (six) hours as needed for moderate pain.    . pantoprazole (PROTONIX) 40 MG tablet Take 1 tablet (40 mg total) by mouth daily. 90 tablet 3   No current facility-administered medications for this visit.    No Known Allergies    Discussed warning signs or symptoms. Please see discharge instructions. Patient expresses understanding.

## 2018-10-22 NOTE — Patient Instructions (Signed)
Access Code: [email or web address]  URL: [email or web address]  Date: 10/22/2018  Prepared by: Kerin Perna   Exercises Staggered Stance Squat - 5-10 reps - 1-2 sets - 1x daily - 7x weekly Towel Scrunches - 10 reps - 3 sets - 1x daily - 7x weekly

## 2018-10-22 NOTE — Patient Instructions (Addendum)
Thank you for coming in today. Try body helix full ankle sleeve.  You are probably a medium but they have size guides to measure.   Continue PT in person and at home.   If you and the PT think you have more improvement and further PT is warranted I am happy to re-order it.   Ok to start riding. But be careful and progress slowly.   Recheck as needed.    Plan for well visit with Dr Sheppard Coil or Dr Zigmund Daniel in early February.

## 2018-10-27 ENCOUNTER — Encounter: Payer: Self-pay | Admitting: Physical Therapy

## 2018-10-27 ENCOUNTER — Other Ambulatory Visit: Payer: Self-pay

## 2018-10-27 ENCOUNTER — Ambulatory Visit (INDEPENDENT_AMBULATORY_CARE_PROVIDER_SITE_OTHER): Payer: Federal, State, Local not specified - PPO | Admitting: Physical Therapy

## 2018-10-27 DIAGNOSIS — R2689 Other abnormalities of gait and mobility: Secondary | ICD-10-CM | POA: Diagnosis not present

## 2018-10-27 DIAGNOSIS — M25671 Stiffness of right ankle, not elsewhere classified: Secondary | ICD-10-CM | POA: Diagnosis not present

## 2018-10-27 DIAGNOSIS — M25571 Pain in right ankle and joints of right foot: Secondary | ICD-10-CM

## 2018-10-27 NOTE — Therapy (Signed)
[place], [place], [address] Phone: [phone]   Fax:  [phone]  Physical Therapy Treatment  Patient Details  Name: Kristen Nichols MRN: [id number] Date of Birth: October 12, 1955 Referring Provider (PT): Gregor Hams, MD   Encounter Date: 10/27/2018  PT End of Session - 10/27/18 0928    Visit Number  7    Number of Visits  12    Date for PT Re-Evaluation  11/12/18    PT Start Time  0842    PT Stop Time  0936    PT Time Calculation (min)  54 min    Activity Tolerance  Patient tolerated treatment well    Behavior During Therapy  [facility] for tasks assessed/performed       Past Medical History:  Diagnosis Date  . Allergy    Hayfever  . Anxiety   . Benign essential hypertension 10/13/2012   Last Assessment & Plan:  Well controlled.    . Colitis   . Diverticulosis   . GERD (gastroesophageal reflux disease)   . Headache(784.0)   . History of colon polyps   . Hypertension   . IBS (irritable bowel syndrome)   . UC (ulcerative colitis) [facility])     Past Surgical History:  Procedure Laterality Date  . ANKLE SURGERY    . BREAST BIOPSY    . BREAST CYST ASPIRATION    . CESAREAN SECTION    . COLONOSCOPY     has had 2 or 3 of them per patient. Last one was around 2016 with [facility] or maybe [facility]   . NASAL SINUS SURGERY      There were no vitals filed for this visit.  Subjective Assessment - 10/27/18 0842    Subjective  had a little set back this weekend; rolled ankle while walking in the yard (had the ASO on) with increased pain.  still some residual aching; but overall nearly back to baseline    Limitations  Sitting;Standing;Walking    Patient Stated Goals  improve pain, increase ankle stability, return to riding horses    Currently in Pain?  No/denies                       [facility] Adult PT Treatment/Exercise - 10/27/18 0847      Vasopneumatic   Number Minutes  Vasopneumatic   10 minutes    Vasopnuematic Location   Ankle    Vasopneumatic Pressure  Medium    Vasopneumatic Temperature   34 deg       Ankle Exercises: Aerobic   Nustep  L5 x 68mn      Ankle Exercises: Standing   SLS  RLE: with red med ball diagonal reach (calf raise to squat) x 10 each direction; static stance with UE chest press and circles x 10 reps each    Heel Raises  10 reps   explosive rise; slow lowering; RLE only   Other Standing Ankle Exercises  simulated step over horse to sit (Lt foot on BOSU, Rt on 1/2 foal roll) x 10 reps; Lt step up/down from truck height x 10 reps for simulated horse dismount      Ankle Exercises: Plyometrics   Bilateral Jumping  1 set;10 reps   land on RLE only     Ankle Exercises: Seated   Toe Raise  --      Ankle Exercises: Stretches   Slant Board Stretch  3 reps;30 seconds  PT Education - 10/27/18 [phone]    Education Details  trial of horse riding - pt plans to try before next session    Person(s) Educated  Patient    Methods  Explanation    Comprehension  Verbalized understanding          PT Long Term Goals - 10/01/18 1553      PT LONG TERM GOAL #1   Title  independent with HEP    Status  New    Target Date  11/12/18      PT LONG TERM GOAL #2   Title  FOTO score improved to </= 22% limited for improved function    Status  New    Target Date  11/12/18      PT LONG TERM GOAL #3   Title  Rt ankle AROM WNL without pain for improved function    Status  New    Target Date  11/12/18      PT LONG TERM GOAL #4   Title  demonstrate improved functional strength and balance with return to normal gait pattern and ability to perform light simulated horse showing activities for improved function    Status  New    Target Date  11/12/18      PT LONG TERM GOAL #5   Title  report pain < 2/10 with activity for improved function    Status  New    Target Date  11/12/18            Plan - 10/27/18 0934     Clinical Impression Statement  Pt tolerated horse simulated mount/dismount exercises well today with minimal pain or discomfort.  Pt to try riding horse before next session to see how she does and if any other issues need to be addressed.  Overall progressing well, still demonstrates decreased balance and strengthening with functional activities.    Comorbidities  hx prior sprains    Examination-Activity Limitations  Locomotion Level;Stairs;Stand    Examination-Participation Restrictions  Yard Work;Community Activity    Rehab Potential  Poor    PT Frequency  2x / week    PT Duration  6 weeks    PT Treatment/Interventions  ADLs/Self Care Home Management;Cryotherapy;Electrical Stimulation;Ultrasound;Moist Heat;Iontophoresis 43m/ml Dexamethasone;Gait training;Stair training;Functional mobility training;Therapeutic activities;Therapeutic exercise;Balance training;Patient/family education;Neuromuscular re-education;Manual techniques;Passive range of motion;Taping;Vasopneumatic Device;Dry needling    PT Next Visit Plan  Continue with progressive ankle exercises to pt tolerance.    PT Home Exercise Plan  Access Code: [email or web address]    Consulted and Agree with Plan of Care  Patient       Patient will benefit from skilled therapeutic intervention in order to improve the following deficits and impairments:  Abnormal gait, Decreased range of motion, Difficulty walking, Pain, Impaired flexibility, Hypomobility, Decreased balance, Decreased strength, Decreased mobility  Visit Diagnosis: Stiffness of right ankle, not elsewhere classified  Pain in right ankle and joints of right foot  Other abnormalities of gait and mobility     Problem List Patient Active Problem List   Diagnosis Date Noted  . Colitis 04/09/2016  . Insomnia 02/23/2016  . Dyslipidemia 12/29/2014  . Carpal tunnel syndrome 12/29/2014  . Allergic rhinitis 10/13/2012  . Benign essential hypertension 10/13/2012  . Depression, major, single  episode, complete remission ([place] 05/07/2010      SLaureen Abrahams PT, DPT 10/27/18 9:38 AM     [facility] [address]Phone: [phone]  Fax:  [phone]  Name: CRISTYN CROSSNO MRN: [id number] Date of Birth: August 10, 1955

## 2018-10-29 ENCOUNTER — Encounter: Payer: Federal, State, Local not specified - PPO | Admitting: Physical Therapy

## 2018-10-30 ENCOUNTER — Ambulatory Visit (INDEPENDENT_AMBULATORY_CARE_PROVIDER_SITE_OTHER): Payer: Federal, State, Local not specified - PPO | Admitting: Physical Therapy

## 2018-10-30 ENCOUNTER — Other Ambulatory Visit: Payer: Self-pay

## 2018-10-30 DIAGNOSIS — M25671 Stiffness of right ankle, not elsewhere classified: Secondary | ICD-10-CM

## 2018-10-30 DIAGNOSIS — M25571 Pain in right ankle and joints of right foot: Secondary | ICD-10-CM

## 2018-10-30 DIAGNOSIS — R2689 Other abnormalities of gait and mobility: Secondary | ICD-10-CM | POA: Diagnosis not present

## 2018-10-30 NOTE — Therapy (Signed)
[place], [place], [address] Phone: [phone]   Fax:  [phone]  Physical Therapy Treatment  Patient Details  Name: Kristen Nichols MRN: [id number] Date of Birth: [birth year] Referring Provider (PT): Gregor Hams, MD   Encounter Date: 10/30/2018  PT End of Session - 10/30/18 1607    Visit Number  8    Number of Visits  12    Date for PT Re-Evaluation  11/12/18    PT Start Time  0932    PT Stop Time  1700    PT Time Calculation (min)  51 min    Activity Tolerance  Patient tolerated treatment well    Behavior During Therapy  [facility] for tasks assessed/performed       Past Medical History:  Diagnosis Date  . Allergy    Hayfever  . Anxiety   . Benign essential hypertension 10/13/2012   Last Assessment & Plan:  Well controlled.    . Colitis   . Diverticulosis   . GERD (gastroesophageal reflux disease)   . Headache(784.0)   . History of colon polyps   . Hypertension   . IBS (irritable bowel syndrome)   . UC (ulcerative colitis) [facility])     Past Surgical History:  Procedure Laterality Date  . ANKLE SURGERY    . BREAST BIOPSY    . BREAST CYST ASPIRATION    . CESAREAN SECTION    . COLONOSCOPY     has had 2 or 3 of them per patient. Last one was around 2016 with [facility] or maybe [facility]   . NASAL SINUS SURGERY      There were no vitals filed for this visit.  Subjective Assessment - 10/30/18 1613    Subjective  She said that she was able to ride her horse and everything went well.    Currently in Pain?  No/denies         [facility] PT Assessment - 10/30/18 0001      Assessment   Medical Diagnosis  S82.891A (ICD-10-CM) - Closed avulsion fracture of right ankle, initial encounter    Referring Provider (PT)  Gregor Hams, MD    Onset Date/Surgical Date  08/23/18    Hand Dominance  Right    Next MD Visit  To be scheduled    Prior Therapy  for Lt ankle (1994-surgery)                    [facility] Adult PT Treatment/Exercise - 10/30/18 0001      Knee/Hip Exercises: Stretches   Passive Hamstring Stretch  --   2 reps Rt/Lt (15 sec)Seated; 1 rep Rt/Lt standing (15sec)   Quad Stretch  --   Rt/Lt 1 rep Seated; 2 rep standing; 15 sec   Gastroc Stretch  --   Standing on edge of 6" step Rt/Lt; 2 reps; 15 secs     Vasopneumatic   Number Minutes Vasopneumatic   10 minutes    Vasopnuematic Location   Ankle    Vasopneumatic Pressure  Medium    Vasopneumatic Temperature   34 deg       Ankle Exercises: Standing   BAPS  Standing;Level 2   Unable to maintain SLS on Rt   Heel Raises  Both;5 reps   Weight shift to Rt foot with eccentric lowering on Rt.   Heel Walk (Round Trip)  2 x 33f; VC's for decreased step length    Toe Walk (Round Trip)  4 x 22f; VC's for step length   short standing rest break for muscle fatigue   Other Standing Ankle Exercises  Standing balance on half foam roll; flat side up, B LE with intermittent B UE support; then parallel SLS to foam roller. 10 reps   Other Standing Ankle Exercises  Rt step down from 19" surface; 5 reps (slower pace) Rt step down from 12" step (repeated for faster pace); 5 reps    Simulated step down from horse     Ankle Exercises: Aerobic   Elliptical  L1 x 245m                  PT Long Term Goals - 10/01/18 1553      PT LONG TERM GOAL #1   Title  independent with HEP    Status  New    Target Date  11/12/18      PT LONG TERM GOAL #2   Title  FOTO score improved to </= 22% limited for improved function    Status  New    Target Date  11/12/18      PT LONG TERM GOAL #3   Title  Rt ankle AROM WNL without pain for improved function    Status  New    Target Date  11/12/18      PT LONG TERM GOAL #4   Title  demonstrate improved functional strength and balance with return to normal gait pattern and ability to perform light simulated horse showing activities for improved function    Status   New    Target Date  11/12/18      PT LONG TERM GOAL #5   Title  report pain < 2/10 with activity for improved function    Status  New    Target Date  11/12/18            Plan - 10/30/18 1723    Clinical Impression Statement  Pt continues to have positive response to simulated horse dismount exercise. Pt unable to tolerate Rt SLS on BAPS; improved tolerance on half foam roll. Pt fatigued quickly with toe and heel walking. Pt making progress toward LTG #4 with horse simulated exercises. She will continue to benefit from further PT to increase functional mobility.    Comorbidities  hx prior sprains    Examination-Activity Limitations  Locomotion Level;Stairs;Stand    Examination-Participation Restrictions  Yard Work;Community Activity    Rehab Potential  Poor    PT Frequency  2x / week    PT Duration  6 weeks    PT Treatment/Interventions  ADLs/Self Care Home Management;Cryotherapy;Electrical Stimulation;Ultrasound;Moist Heat;Iontophoresis 5m46ml Dexamethasone;Gait training;Stair training;Functional mobility training;Therapeutic activities;Therapeutic exercise;Balance training;Patient/family education;Neuromuscular re-education;Manual techniques;Passive range of motion;Taping;Vasopneumatic Device;Dry needling    PT Next Visit Plan  Continue with progressive ankle exercises to pt tolerance.    PT Home Exercise Plan  Access Code: [email or web address]    Consulted and Agree with Plan of Care  Patient       Patient will benefit from skilled therapeutic intervention in order to improve the following deficits and impairments:  Abnormal gait, Decreased range of motion, Difficulty walking, Pain, Impaired flexibility, Hypomobility, Decreased balance, Decreased strength, Decreased mobility  Visit Diagnosis: Stiffness of right ankle, not elsewhere classified  Pain in right ankle and joints of right foot  Other abnormalities of gait and mobility     Problem List Patient Active Problem List    Diagnosis Date Noted  . Colitis 04/09/2016  . Insomnia 02/23/2016  .  Dyslipidemia 12/29/2014  . Carpal tunnel syndrome 12/29/2014  . Allergic rhinitis 10/13/2012  . Benign essential hypertension 10/13/2012  . Depression, major, single episode, complete remission ([place]) 05/07/2010    Gardiner Rhyme, SPTA 10/30/2018, 5:55 PM   Kerin Perna, PTA 10/30/18 5:59 PM   [facility] [place], [place], [address] Phone: [phone]   Fax:  [phone]  Name: Kristen Nichols MRN: [id number] Date of Birth: Dec 06, 1955

## 2018-11-04 NOTE — Telephone Encounter (Signed)
Left message for patient to call back to the office;  

## 2018-11-05 ENCOUNTER — Encounter: Payer: Federal, State, Local not specified - PPO | Admitting: Physical Therapy

## 2018-11-05 ENCOUNTER — Other Ambulatory Visit: Payer: Self-pay

## 2018-11-05 DIAGNOSIS — K2100 Gastro-esophageal reflux disease with esophagitis, without bleeding: Secondary | ICD-10-CM

## 2018-11-05 DIAGNOSIS — K5730 Diverticulosis of large intestine without perforation or abscess without bleeding: Secondary | ICD-10-CM

## 2018-11-05 DIAGNOSIS — K581 Irritable bowel syndrome with constipation: Secondary | ICD-10-CM

## 2018-11-05 DIAGNOSIS — K449 Diaphragmatic hernia without obstruction or gangrene: Secondary | ICD-10-CM

## 2018-11-05 NOTE — Telephone Encounter (Signed)
Called and spoke with patient-patient is agreeable to plan of care with TIF procedure being scheduled at [facility] on 12/11/2018 and COVID screening being scheduled on 12/08/2018 at 9:05 am; instructions have been sent to the patient via [place] and mail; Patient advised to call back to the office at [phone] should questions/concerns arise; Patient verbalized understanding of information/instructions;

## 2018-11-05 NOTE — Telephone Encounter (Signed)
Pt returning your call

## 2018-11-07 ENCOUNTER — Ambulatory Visit (HOSPITAL_COMMUNITY): Admit: 2018-11-07 | Payer: Federal, State, Local not specified - PPO | Admitting: Gastroenterology

## 2018-11-07 ENCOUNTER — Encounter: Payer: Self-pay | Admitting: Physical Therapy

## 2018-11-07 ENCOUNTER — Ambulatory Visit (INDEPENDENT_AMBULATORY_CARE_PROVIDER_SITE_OTHER): Payer: Federal, State, Local not specified - PPO | Admitting: Physical Therapy

## 2018-11-07 ENCOUNTER — Other Ambulatory Visit: Payer: Self-pay

## 2018-11-07 ENCOUNTER — Encounter (HOSPITAL_COMMUNITY): Payer: Self-pay

## 2018-11-07 DIAGNOSIS — M25571 Pain in right ankle and joints of right foot: Secondary | ICD-10-CM | POA: Diagnosis not present

## 2018-11-07 DIAGNOSIS — M25671 Stiffness of right ankle, not elsewhere classified: Secondary | ICD-10-CM | POA: Diagnosis not present

## 2018-11-07 SURGERY — FUNDOPLICATION, STOMACH, INCISIONLESS, ORAL APPROACH
Anesthesia: General

## 2018-11-07 NOTE — Therapy (Addendum)
[place], [place], [address] Phone: [phone]   Fax:  [phone]  Physical Therapy Treatment/Discharge Summary  Patient Details  Name: Kristen Nichols MRN: [id number] Date of Birth: 1955-07-09 Referring Provider (PT): Gregor Hams, MD   Encounter Date: 11/07/2018  PT End of Session - 11/07/18 0841    Visit Number  9    Number of Visits  12    Date for PT Re-Evaluation  11/12/18    PT Start Time  0843    PT Stop Time  0931    PT Time Calculation (min)  48 min    Activity Tolerance  Patient tolerated treatment well    Behavior During Therapy  [facility] for tasks assessed/performed       Past Medical History:  Diagnosis Date  . Allergy    Hayfever  . Anxiety   . Benign essential hypertension 10/13/2012   Last Assessment & Plan:  Well controlled.    . Colitis   . Diverticulosis   . GERD (gastroesophageal reflux disease)   . Headache(784.0)   . History of colon polyps   . Hypertension   . IBS (irritable bowel syndrome)   . UC (ulcerative colitis) [facility])     Past Surgical History:  Procedure Laterality Date  . ANKLE SURGERY    . BREAST BIOPSY    . BREAST CYST ASPIRATION    . CESAREAN SECTION    . COLONOSCOPY     has had 2 or 3 of them per patient. Last one was around 2016 with [facility] or maybe [facility]   . NASAL SINUS SURGERY      There were no vitals filed for this visit.  Subjective Assessment - 11/07/18 0844    Subjective  "I don't think I have had pain in awhile. I think I am 75%-80% back to where I was before my injury."    Currently in Pain?  No/denies         [facility] PT Assessment - 11/07/18 0001      Assessment   Medical Diagnosis  S82.891A (ICD-10-CM) - Closed avulsion fracture of right ankle, initial encounter    Referring Provider (PT)  Gregor Hams, MD    Onset Date/Surgical Date  08/23/18    Hand Dominance  Right    Next MD Visit  To be scheduled    Prior  Therapy  for Lt ankle (1994-surgery)      AROM   AROM Assessment Site  Ankle    Right/Left Ankle  Right    Right Ankle Dorsiflexion  12    Right Ankle Plantar Flexion  44    Right Ankle Inversion  40    Right Ankle Eversion  32       [facility] Adult PT Treatment/Exercise - 11/07/18 0001      Knee/Hip Exercises: Stretches   Passive Hamstring Stretch  2 reps;20 seconds;Right;Left   seated   Quad Stretch  20 seconds;2 reps;Right;Left   seated   Gastroc Stretch  --   Standing on edge of 6" step Rt/Lt; 3reps; 20 secs     Vasopneumatic   Number Minutes Vasopneumatic   10 minutes    Vasopnuematic Location   Ankle    Vasopneumatic Pressure  Medium    Vasopneumatic Temperature   34 deg       Ankle Exercises: Aerobic   Elliptical  L1 x 40mn      Ankle Exercises: Standing   SLS  RLE: with reaching to target within BOS 10 reps   intermittent unilateral UE support on hand rail   Heel Raises  Both;10 reps   Weight shift to right with eccentric lowering   Heel Walk (Round Trip)  3 x 88f Lt/Rt - with light UE support on counter   Toe Walk (Round Trip)  2 x 871fLt/Rt  with light UE support on counter   Other Standing Ankle Exercises  Standing balance on half foam roll; flat side up; minimal B UE support; half foam roller with flat side up; perpendicular  tandem stance with intermittent B UE supoort   2 reps each of 60 sec.        PT Long Term Goals - 11/07/18 0925      PT LONG TERM GOAL #1   Title  independent with HEP    Status  On-going      PT LONG TERM GOAL #2   Title  FOTO score improved to </= 22% limited for improved function    Status  Achieved      PT LONG TERM GOAL #3   Title  Rt ankle AROM WNL without pain for improved function    Status  Achieved      PT LONG TERM GOAL #4   Title  demonstrate improved functional strength and balance with return to normal gait pattern and ability to perform light simulated horse showing activities for improved function    Status   Achieved      PT LONG TERM GOAL #5   Title  report pain < 2/10 with activity for improved function    Baseline  Occasionally pain reaches 3-8-1/01ith certain activities.    Status  Partially Met       Plan - 11/07/18 0953    Clinical Impression Statement  Pt continues to have some difficulty with balance exercises on Rt LE. She required intermittent VCs for technique of exercises during session .She has met goals LTG #2,3,4 and has partially met LTG #5. She agreeable to hold therapy while she continues to work on [employer]   Comorbidities  hx prior sprains    Examination-Activity Limitations  Locomotion Level;Stairs;Stand    Examination-Participation Restrictions  Yard Work;Community Activity    Rehab Potential  Poor    PT Frequency  2x / week    PT Duration  6 weeks    PT Treatment/Interventions  ADLs/Self Care Home Management;Cryotherapy;Electrical Stimulation;Ultrasound;Moist Heat;Iontophoresis 100m66ml Dexamethasone;Gait training;Stair training;Functional mobility training;Therapeutic activities;Therapeutic exercise;Balance training;Patient/family education;Neuromuscular re-education;Manual techniques;Passive range of motion;Taping;Vasopneumatic Device;Dry needling    PT Next Visit Plan  spoke to supervising PT; Will hold pt until 11/30 per pt request. If pt does not return before then;will d/c.    PT Home Exercise Plan  Access Code: [email or web address]    Consulted and Agree with Plan of Care  Patient       Patient will benefit from skilled therapeutic intervention in order to improve the following deficits and impairments:  Abnormal gait, Decreased range of motion, Difficulty walking, Pain, Impaired flexibility, Hypomobility, Decreased balance, Decreased strength, Decreased mobility  Visit Diagnosis: Stiffness of right ankle, not elsewhere classified  Pain in right ankle and joints of right foot     Problem List Patient Active Problem List   Diagnosis Date Noted  . Colitis 04/09/2016  .  Insomnia 02/23/2016  . Dyslipidemia 12/29/2014  . Carpal tunnel syndrome 12/29/2014  . Allergic rhinitis 10/13/2012  . Benign essential hypertension 10/13/2012  . Depression, major, single  episode, complete remission ([place]) 05/07/2010    Gardiner Rhyme, SPTA 11/07/2018, 10:00 AM   Kerin Perna, PTA 11/07/18 10:08 AM   [place], [place], [address] Phone: [phone]   Fax:  [phone]  Name: CLAIRE DOLORES MRN: [id number] Date of Birth: 1955/09/04     PHYSICAL THERAPY DISCHARGE SUMMARY  Visits from Start of Care: 9  Current functional level related to goals / functional outcomes: See above   Remaining deficits: See above   Education / Equipment: HEP  Plan: Patient agrees to discharge.  Patient goals were partially met. Patient is being discharged due to being pleased with the current functional level.  ?????    Laureen Abrahams, PT, DPT 01/12/19 12:59 PM  [facility] [place], [place] [address]  [phone] (office) [phone] (fax)

## 2018-11-10 ENCOUNTER — Encounter: Payer: Federal, State, Local not specified - PPO | Admitting: Physical Therapy

## 2018-11-11 ENCOUNTER — Other Ambulatory Visit: Payer: Self-pay | Admitting: Family Medicine

## 2018-11-12 ENCOUNTER — Encounter: Payer: Federal, State, Local not specified - PPO | Admitting: Physical Therapy

## 2018-11-27 ENCOUNTER — Ambulatory Visit
Admission: RE | Admit: 2018-11-27 | Discharge: 2018-11-27 | Disposition: A | Discharge status: Home / Self Care | Payer: Federal, State, Local not specified - PPO | Source: Ambulatory Visit | Attending: Family Medicine | Admitting: Family Medicine

## 2018-11-27 ENCOUNTER — Other Ambulatory Visit: Payer: Self-pay

## 2018-11-27 DIAGNOSIS — Z1231 Encounter for screening mammogram for malignant neoplasm of breast: Secondary | ICD-10-CM

## 2018-12-08 ENCOUNTER — Other Ambulatory Visit (HOSPITAL_COMMUNITY)
Admission: RE | Admit: 2018-12-08 | Discharge: 2018-12-08 | Disposition: A | Discharge status: Home / Self Care | Payer: Federal, State, Local not specified - PPO | Source: Ambulatory Visit | Attending: Gastroenterology | Admitting: Gastroenterology

## 2018-12-08 DIAGNOSIS — Z20828 Contact with and (suspected) exposure to other viral communicable diseases: Secondary | ICD-10-CM | POA: Insufficient documentation

## 2018-12-08 DIAGNOSIS — Z01812 Encounter for preprocedural laboratory examination: Secondary | ICD-10-CM | POA: Diagnosis not present

## 2018-12-09 LAB — NOVEL CORONAVIRUS, NAA (HOSP ORDER, SEND-OUT TO REF LAB; TAT 18-24 HRS): SARS-CoV-2, NAA: NOT DETECTED

## 2018-12-10 ENCOUNTER — Encounter: Payer: Self-pay | Admitting: Gastroenterology

## 2018-12-10 ENCOUNTER — Other Ambulatory Visit: Payer: Self-pay

## 2018-12-10 ENCOUNTER — Encounter (HOSPITAL_COMMUNITY): Payer: Self-pay | Admitting: Emergency Medicine

## 2018-12-10 NOTE — Telephone Encounter (Signed)
Opened in ERROR

## 2018-12-10 NOTE — Progress Notes (Signed)
Pre-op endo call attempted. No answer. [email or web address]

## 2018-12-10 NOTE — Progress Notes (Signed)
Pre-op endo call completed.  Patient states she will be undergoing general anesthesia and was instructed by Dr Denyce Robert that she will be staying overnight for monitoring.

## 2018-12-11 ENCOUNTER — Ambulatory Visit (HOSPITAL_COMMUNITY): Payer: Federal, State, Local not specified - PPO | Admitting: Certified Registered"

## 2018-12-11 ENCOUNTER — Observation Stay (HOSPITAL_COMMUNITY)
Admission: RE | Admit: 2018-12-11 | Discharge: 2018-12-12 | Disposition: A | Discharge status: Home / Self Care | Payer: Federal, State, Local not specified - PPO | Attending: Gastroenterology | Admitting: Gastroenterology

## 2018-12-11 ENCOUNTER — Encounter (HOSPITAL_COMMUNITY)
Admission: RE | Disposition: A | Discharge status: Home / Self Care | Payer: Self-pay | Source: Home / Self Care | Attending: Gastroenterology

## 2018-12-11 ENCOUNTER — Encounter (HOSPITAL_COMMUNITY): Payer: Self-pay | Admitting: *Deleted

## 2018-12-11 DIAGNOSIS — K581 Irritable bowel syndrome with constipation: Secondary | ICD-10-CM

## 2018-12-11 DIAGNOSIS — Z87891 Personal history of nicotine dependence: Secondary | ICD-10-CM | POA: Insufficient documentation

## 2018-12-11 DIAGNOSIS — F418 Other specified anxiety disorders: Secondary | ICD-10-CM | POA: Insufficient documentation

## 2018-12-11 DIAGNOSIS — Z9889 Other specified postprocedural states: Secondary | ICD-10-CM | POA: Diagnosis not present

## 2018-12-11 DIAGNOSIS — Z79899 Other long term (current) drug therapy: Secondary | ICD-10-CM | POA: Insufficient documentation

## 2018-12-11 DIAGNOSIS — K2100 Gastro-esophageal reflux disease with esophagitis, without bleeding: Principal | ICD-10-CM

## 2018-12-11 DIAGNOSIS — K449 Diaphragmatic hernia without obstruction or gangrene: Secondary | ICD-10-CM | POA: Diagnosis not present

## 2018-12-11 DIAGNOSIS — K219 Gastro-esophageal reflux disease without esophagitis: Secondary | ICD-10-CM

## 2018-12-11 DIAGNOSIS — I10 Essential (primary) hypertension: Secondary | ICD-10-CM | POA: Insufficient documentation

## 2018-12-11 DIAGNOSIS — K5730 Diverticulosis of large intestine without perforation or abscess without bleeding: Secondary | ICD-10-CM

## 2018-12-11 DIAGNOSIS — K5790 Diverticulosis of intestine, part unspecified, without perforation or abscess without bleeding: Secondary | ICD-10-CM | POA: Diagnosis not present

## 2018-12-11 HISTORY — PX: TRANSORAL INCISIONLESS FUNDOPLICATION: SHX6840

## 2018-12-11 HISTORY — DX: Other specified postprocedural states: R11.2

## 2018-12-11 HISTORY — PX: MALONEY DILATION: SHX5535

## 2018-12-11 HISTORY — PX: ESOPHAGOGASTRODUODENOSCOPY (EGD) WITH PROPOFOL: SHX5813

## 2018-12-11 HISTORY — DX: Nausea with vomiting, unspecified: Z98.890

## 2018-12-11 SURGERY — ESOPHAGOGASTRODUODENOSCOPY (EGD) WITH PROPOFOL
Anesthesia: General

## 2018-12-11 MED ORDER — FENTANYL CITRATE (PF) 100 MCG/2ML IJ SOLN
INTRAMUSCULAR | Status: AC
  Filled 2018-12-11: qty 2

## 2018-12-11 MED ORDER — LIDOCAINE VISCOUS HCL 2 % MT SOLN
5.0000 mL | Freq: Three times a day (TID) | OROMUCOSAL | Status: DC | PRN
  Administered 2018-12-11 (×2): 5 mL via OROMUCOSAL
  Filled 2018-12-11 (×2): qty 15

## 2018-12-11 MED ORDER — ONDANSETRON HCL 4 MG/2ML IJ SOLN
4.0000 mg | Freq: Once | INTRAMUSCULAR | Status: AC
  Administered 2018-12-11: 4 mg via INTRAVENOUS

## 2018-12-11 MED ORDER — LIDOCAINE 2% (20 MG/ML) 5 ML SYRINGE
INTRAMUSCULAR | Status: DC | PRN
  Administered 2018-12-11: 40 mg via INTRAVENOUS

## 2018-12-11 MED ORDER — HYDROCHLOROTHIAZIDE 25 MG PO TABS: 25.0000 mg | ORAL_TABLET | Freq: Every day | ORAL | Status: DC

## 2018-12-11 MED ORDER — ACETAMINOPHEN-CODEINE 120-12 MG/5ML PO SOLN
15.0000 mL | ORAL | Status: DC | PRN
  Administered 2018-12-11 – 2018-12-12 (×3): 15 mL via ORAL
  Filled 2018-12-11 (×3): qty 20

## 2018-12-11 MED ORDER — ONDANSETRON HCL 4 MG/2ML IJ SOLN
INTRAMUSCULAR | Status: DC | PRN
  Administered 2018-12-11: 4 mg via INTRAVENOUS

## 2018-12-11 MED ORDER — PROMETHAZINE HCL 25 MG/ML IJ SOLN: 6.2500 mg | INTRAMUSCULAR | Status: DC | PRN

## 2018-12-11 MED ORDER — SIMETHICONE 80 MG PO CHEW: 80.0000 mg | CHEWABLE_TABLET | Freq: Four times a day (QID) | ORAL | Status: DC | PRN

## 2018-12-11 MED ORDER — MIDAZOLAM HCL 5 MG/5ML IJ SOLN
INTRAMUSCULAR | Status: DC | PRN
  Administered 2018-12-11: 2 mg via INTRAVENOUS

## 2018-12-11 MED ORDER — FLUOXETINE HCL 20 MG PO CAPS: 20.0000 mg | ORAL_CAPSULE | Freq: Every day | ORAL | Status: DC

## 2018-12-11 MED ORDER — BUPROPION HCL ER (XL) 150 MG PO TB24: 150.0000 mg | ORAL_TABLET | Freq: Every day | ORAL | Status: DC

## 2018-12-11 MED ORDER — ROCURONIUM BROMIDE 10 MG/ML (PF) SYRINGE
PREFILLED_SYRINGE | INTRAVENOUS | Status: DC | PRN
  Administered 2018-12-11: 40 mg via INTRAVENOUS

## 2018-12-11 MED ORDER — ONDANSETRON HCL 4 MG/2ML IJ SOLN
INTRAMUSCULAR | Status: AC
  Filled 2018-12-11: qty 2

## 2018-12-11 MED ORDER — METOCLOPRAMIDE HCL 5 MG/ML IJ SOLN
10.0000 mg | Freq: Four times a day (QID) | INTRAMUSCULAR | Status: DC
  Administered 2018-12-11 – 2018-12-12 (×4): 10 mg via INTRAVENOUS
  Filled 2018-12-11 (×4): qty 2

## 2018-12-11 MED ORDER — POLYVINYL ALCOHOL 1.4 % OP SOLN: 1.0000 [drp] | Freq: Two times a day (BID) | OPHTHALMIC | Status: DC | PRN

## 2018-12-11 MED ORDER — FENTANYL CITRATE (PF) 100 MCG/2ML IJ SOLN
25.0000 ug | INTRAMUSCULAR | Status: DC | PRN
  Administered 2018-12-11: 50 ug via INTRAVENOUS

## 2018-12-11 MED ORDER — SUGAMMADEX SODIUM 200 MG/2ML IV SOLN
INTRAVENOUS | Status: DC | PRN
  Administered 2018-12-11: 200 mg via INTRAVENOUS

## 2018-12-11 MED ORDER — DEXAMETHASONE SODIUM PHOSPHATE 10 MG/ML IJ SOLN
8.0000 mg | Freq: Four times a day (QID) | INTRAMUSCULAR | Status: DC
  Administered 2018-12-11 – 2018-12-12 (×4): 8 mg via INTRAVENOUS
  Filled 2018-12-11 (×4): qty 1

## 2018-12-11 MED ORDER — ESMOLOL HCL 100 MG/10ML IV SOLN
INTRAVENOUS | Status: DC | PRN
  Administered 2018-12-11: 20 mg via INTRAVENOUS

## 2018-12-11 MED ORDER — SCOPOLAMINE 1 MG/3DAYS TD PT72
MEDICATED_PATCH | TRANSDERMAL | Status: AC
  Filled 2018-12-11: qty 1

## 2018-12-11 MED ORDER — MEPERIDINE HCL 25 MG/ML IJ SOLN: 6.2500 mg | INTRAMUSCULAR | Status: DC | PRN

## 2018-12-11 MED ORDER — PANTOPRAZOLE SODIUM 40 MG PO TBEC
40.0000 mg | DELAYED_RELEASE_TABLET | Freq: Two times a day (BID) | ORAL | Status: DC
  Administered 2018-12-11: 40 mg via ORAL
  Filled 2018-12-11: qty 1

## 2018-12-11 MED ORDER — MIDAZOLAM HCL 2 MG/2ML IJ SOLN
INTRAMUSCULAR | Status: AC
  Filled 2018-12-11: qty 2

## 2018-12-11 MED ORDER — SCOPOLAMINE 1 MG/3DAYS TD PT72
1.0000 | MEDICATED_PATCH | Freq: Once | TRANSDERMAL | Status: DC
  Administered 2018-12-11: 1.5 mg via TRANSDERMAL

## 2018-12-11 MED ORDER — ONDANSETRON HCL 4 MG/2ML IJ SOLN
4.0000 mg | Freq: Four times a day (QID) | INTRAMUSCULAR | Status: DC
  Administered 2018-12-11 – 2018-12-12 (×4): 4 mg via INTRAVENOUS
  Filled 2018-12-11 (×4): qty 2

## 2018-12-11 MED ORDER — FLUTICASONE PROPIONATE 50 MCG/ACT NA SUSP
2.0000 | Freq: Every day | NASAL | Status: DC | PRN
  Filled 2018-12-11: qty 16

## 2018-12-11 MED ORDER — PROPOFOL 10 MG/ML IV BOLUS
INTRAVENOUS | Status: DC | PRN
  Administered 2018-12-11: 150 mg via INTRAVENOUS

## 2018-12-11 MED ORDER — LACTATED RINGERS IV SOLN
INTRAVENOUS | Status: DC
  Administered 2018-12-11 (×2): via INTRAVENOUS

## 2018-12-11 MED ORDER — CEFAZOLIN SODIUM-DEXTROSE 2-4 GM/100ML-% IV SOLN
2.0000 g | Freq: Once | INTRAVENOUS | Status: AC
  Administered 2018-12-11: 08:00:00 2 g via INTRAVENOUS
  Filled 2018-12-11: qty 100

## 2018-12-11 MED ORDER — FAMOTIDINE IN NACL 20-0.9 MG/50ML-% IV SOLN
20.0000 mg | Freq: Once | INTRAVENOUS | Status: AC
  Administered 2018-12-11: 20 mg via INTRAVENOUS
  Filled 2018-12-11: qty 50

## 2018-12-11 MED ORDER — DEXAMETHASONE SODIUM PHOSPHATE 10 MG/ML IJ SOLN
INTRAMUSCULAR | Status: DC | PRN
  Administered 2018-12-11: 10 mg via INTRAVENOUS

## 2018-12-11 MED ORDER — SODIUM CHLORIDE 0.9 % IV SOLN
INTRAVENOUS | Status: DC
  Administered 2018-12-11: 11:00:00 via INTRAVENOUS

## 2018-12-11 MED ORDER — SODIUM CHLORIDE 0.9 % IV SOLN: INTRAVENOUS | Status: DC

## 2018-12-11 MED ORDER — FENTANYL CITRATE (PF) 250 MCG/5ML IJ SOLN
INTRAMUSCULAR | Status: AC
  Filled 2018-12-11: qty 5

## 2018-12-11 MED ORDER — FENTANYL CITRATE (PF) 100 MCG/2ML IJ SOLN
INTRAMUSCULAR | Status: DC | PRN
  Administered 2018-12-11 (×3): 50 ug via INTRAVENOUS

## 2018-12-11 SURGICAL SUPPLY — 15 items
BLOCK BITE 60FR ADLT L/F BLUE (MISCELLANEOUS) ×3 IMPLANT
ELECT REM PT RETURN 9FT ADLT (ELECTROSURGICAL)
ELECTRODE REM PT RTRN 9FT ADLT (ELECTROSURGICAL) IMPLANT
FORCEP RJ3 GP 1.8X160 W-NEEDLE (CUTTING FORCEPS) IMPLANT
FORCEPS BIOP RAD 4 LRG CAP 4 (CUTTING FORCEPS) IMPLANT
NEEDLE SCLEROTHERAPY 25GX240 (NEEDLE) IMPLANT
PROBE APC STR FIRE (PROBE) IMPLANT
PROBE INJECTION GOLD (MISCELLANEOUS)
PROBE INJECTION GOLD 7FR (MISCELLANEOUS) IMPLANT
SNARE SHORT THROW 13M SML OVAL (MISCELLANEOUS) IMPLANT
SYR 50ML LL SCALE MARK (SYRINGE) IMPLANT
Serosa Fuse Implantable Fastener Kit ×3 IMPLANT
TUBING ENDO SMARTCAP PENTAX (MISCELLANEOUS) ×6 IMPLANT
TUBING IRRIGATION ENDOGATOR (MISCELLANEOUS) ×3 IMPLANT
WATER STERILE IRR 1000ML POUR (IV SOLUTION) IMPLANT

## 2018-12-11 NOTE — H&P (Signed)
P  Chief Complaint:    GERD  GI History:  63 year old female initially seen by me on 02/19/2022 alternating bowel habits, abdominal pain, and reflux symptoms, and presents today for Transoral Incisionless Fundoplication (TIF).  Longstanding history of GERD, complicated by erosive esophagitis.  Index symptoms of heartburnand regurgitation, belch.More often at night.  No dysphagiaor odynophagia.  Previous intolerance to omeprazoledaily (nausea), so she was only taking intermittently.  EGD in 02/2018 with LA grade A esophagitis, 1 cm hiatal hernia with a Hill grade 2 valve.  Prescribed Prilosec 20 mg p.o. twice daily x8 weeks with good clinical response to therapy, but still with intermittent breakthrough sxs. She eventually tritrate off due to nausea with PPI. She would like to proceed with TIF as a means to better control her reflux, treat reflux esophagitis, and stop or significantly reduce PPI therapy needs.  Endoscopic history: -EGD (2020, Dr. Bryan Lemma): 1 cm hiatal hernia, LA grade A esophagitis, Hill grade 2 valve, normal stomach and duodenum. -Colonoscopy (2020, Dr. Bryan Lemma): 3 small, 2 to 3 mm, Hyperplastic polyps, sigmoid diverticulosis, otherwise normal with biopsies negative for [facility].  Recommended repeat in 10 years. -Colonoscopy (2014, [facility]):5 mm polyp in cecum and 3 mm polyp in transverse colon (path polypoid colonic mucosa with benign reactive lymphoid hyperplasia), normal TI, sigmoid diverticulosis -Colonoscopy (2012, [place]): No report available for review. Patient reports polyps  HPI:     Patient is a 63 y.o. female presenting today for TIF as  a means to better control her reflux, treat reflux esophagitis, and stop or significantly reduce PPI therapy needs. She has had increasing reflux sxs lately, particularly nocturnal reflux. She is otherwise in her usual state of health and without any new complaints since last appt.    Review of systems:     No chest pain, no SOB, no fevers, no urinary sx   Past Medical History:  Diagnosis Date  . Allergy    Hayfever  . Anxiety   . Benign essential hypertension 10/13/2012   Last Assessment & Plan:  Well controlled.    . Colitis   . Diverticulosis   . GERD (gastroesophageal reflux disease)   . Headache(784.0)   . History of colon polyps   . Hypertension   . IBS (irritable bowel syndrome)   . PONV (postoperative nausea and vomiting)    after ankle surgery in 1994  . UC (ulcerative colitis) ([place])     Patient's surgical history, family medical history, social history, medications and allergies were all reviewed in Epic    Current Facility-Administered Medications  Medication Dose Route Frequency Provider Last Rate Last Dose  . 0.9 %  sodium chloride infusion   Intravenous Continuous Cordelro Gautreau V, DO      . ceFAZolin (ANCEF) IVPB 2g/100 mL premix  2 g Intravenous Once Rob Mciver V, DO      . famotidine (PEPCID) IVPB 20 mg premix  20 mg Intravenous Once Demya Scruggs V, DO 100 mL/hr at 12/11/18 0729 20 mg at 12/11/18 0729  . lactated ringers infusion   Intravenous Continuous Parsa Rickett V, DO      . scopolamine (TRANSDERM-SCOP) 1 MG/3DAYS 1.5 mg  1 patch Transdermal Once Tashae Inda V, DO   1.5 mg at 12/11/18 0717    Physical Exam:     BP 131/78   Pulse 83   Temp 98.5 F (36.9 C) (Oral)   Resp (!) 8   Ht 5' 2"  (1.575 m)  Wt 65.8 kg   SpO2 97%   BMI 26.52 kg/m   GENERAL:  Pleasant female in NAD PSYCH: : Cooperative, normal affect EENT:  conjunctiva pink, mucous membranes moist, neck supple without masses CARDIAC:  RRR, no murmur heard, no peripheral edema PULM: Normal respiratory effort, lungs CTA bilaterally, no wheezing ABDOMEN:  Nondistended, soft, nontender. No obvious masses, no hepatomegaly,  normal bowel sounds SKIN:  turgor, no lesions seen Musculoskeletal:  Normal muscle tone, normal strength NEURO: Alert and  oriented x 3, no focal neurologic deficits   IMPRESSION and PLAN:     1) GERD with erosive esophagitis 2) Hiatal hernia  -Plan for TIF this morning, with overnight admission for observation -Additional recommendations regarding diet, medications following completion of TIF     The indications, risks, and benefits of EGD with TIF were explained to the patient in detail. Risks include but are not limited to bleeding, perforation, adverse reaction to medications, and cardiopulmonary compromise. Sequelae include but are not limited to the possibility of surgery, hositalization, and mortality. The patient verbalized understanding and wished to proceed. All questions answered, referred to scheduler. Further recommendations pending results of the exam.        Dominic Pea Sumner Boesch ,DO, FACG 12/11/2018, 7:50 AM

## 2018-12-11 NOTE — Anesthesia Procedure Notes (Signed)
Procedure Name: Intubation Date/Time: 12/11/2018 8:16 AM Performed by: Silas Sacramento, CRNA Pre-anesthesia Checklist: Patient identified, Emergency Drugs available, Suction available and Patient being monitored Patient Re-evaluated:Patient Re-evaluated prior to induction Oxygen Delivery Method: Circle system utilized Preoxygenation: Pre-oxygenation with 100% oxygen Induction Type: IV induction Ventilation: Mask ventilation without difficulty Laryngoscope Size: Mac and 3 Grade View: Grade II Tube type: Oral Tube size: 7.0 mm Number of attempts: 1 Airway Equipment and Method: Stylet and Oral airway Placement Confirmation: ETT inserted through vocal cords under direct vision,  positive ETCO2 and breath sounds checked- equal and bilateral Secured at: 22 cm Tube secured with: Tape Dental Injury: Teeth and Oropharynx as per pre-operative assessment

## 2018-12-11 NOTE — Transfer of Care (Signed)
Immediate Anesthesia Transfer of Care Note  Patient: KARLEEN SEEBECK  Procedure(s) Performed: ESOPHAGOGASTRODUODENOSCOPY (EGD) WITH PROPOFOL (N/A ) TRANSORAL INCISIONLESS FUNDOPLICATION (N/A ) [place]  Patient Location: PACU  Anesthesia Type:General  Level of Consciousness: awake, oriented, patient cooperative and responds to stimulation  Airway & Oxygen Therapy: Patient Spontanous Breathing and Patient connected to face mask oxygen  Post-op Assessment: Report given to RN and Post -op Vital signs reviewed and stable  Post vital signs: Reviewed and stable  Last Vitals:  Vitals Value Taken Time  BP 153/89 12/11/18 0913  Temp    Pulse 77 12/11/18 0914  Resp 14 12/11/18 0914  SpO2 100 % 12/11/18 0914  Vitals shown include unvalidated device data.  Last Pain:  Vitals:   12/11/18 0812  TempSrc:   PainSc: 0-No pain         Complications: No apparent anesthesia complications

## 2018-12-11 NOTE — Interval H&P Note (Signed)
History and Physical Interval Note:  12/11/2018 8:11 AM  Kristen Nichols  has presented today for surgery, with the diagnosis of diverticulosis/GERD w/esophagitis/IBS-C/HH.  The various methods of treatment have been discussed with the patient and family. After consideration of risks, benefits and other options for treatment, the patient has consented to  Procedure(s): ESOPHAGOGASTRODUODENOSCOPY (EGD) WITH PROPOFOL (N/A) TRANSORAL INCISIONLESS FUNDOPLICATION (N/A) as a surgical intervention.  The patient's history has been reviewed, patient examined, no change in status, stable for surgery.  I have reviewed the patient's chart and labs.  Questions were answered to the patient's satisfaction.     Dominic Pea Hedwig Mcfall

## 2018-12-11 NOTE — Anesthesia Postprocedure Evaluation (Signed)
Anesthesia Post Note  Patient: Kristen Nichols  Procedure(s) Performed: ESOPHAGOGASTRODUODENOSCOPY (EGD) WITH PROPOFOL (N/A ) TRANSORAL INCISIONLESS FUNDOPLICATION (N/A ) [place] DIL[facility]     Patient location during evaluation: PACU Anesthesia Type: General Level of consciousness: sedated and patient cooperative Pain management: pain level controlled Vital Signs Assessment: post-procedure vital signs reviewed and stable Respiratory status: spontaneous breathing Cardiovascular status: stable Anesthetic complications: no    Last Vitals:  Vitals:   12/11/18 1000 12/11/18 1031  BP: 130/83 139/77  Pulse: 78 85  Resp: 13 14  Temp:  37.1 C  SpO2: 100% 97%    Last Pain:  Vitals:   12/11/18 1031  TempSrc: Oral  PainSc:                  Nolon Nations

## 2018-12-11 NOTE — Anesthesia Preprocedure Evaluation (Addendum)
Anesthesia Evaluation  Patient identified by MRN, date of birth, ID band Patient awake    Reviewed: Allergy & Precautions, NPO status , Patient's Chart, lab work & pertinent test results  History of Anesthesia Complications (+) PONV and history of anesthetic complications  Airway Mallampati: II  TM Distance: >3 FB Neck ROM: Full    Dental no notable dental hx. (+) Dental Advisory Given   Pulmonary neg pulmonary ROS, former smoker,    Pulmonary exam normal breath sounds clear to auscultation       Cardiovascular hypertension, negative cardio ROS Normal cardiovascular exam Rhythm:Regular Rate:Normal     Neuro/Psych  Headaches, PSYCHIATRIC DISORDERS Anxiety Depression    GI/Hepatic Neg liver ROS, PUD, GERD  ,  Endo/Other  negative endocrine ROS  Renal/GU negative Renal ROS     Musculoskeletal negative musculoskeletal ROS (+)   Abdominal   Peds  Hematology negative hematology ROS (+)   Anesthesia Other Findings   Reproductive/Obstetrics negative OB ROS                            Anesthesia Physical Anesthesia Plan  ASA: II  Anesthesia Plan: General   Post-op Pain Management:    Induction: Intravenous  PONV Risk Score and Plan: 4 or greater and Ondansetron, Dexamethasone, Treatment may vary due to age or medical condition, Scopolamine patch - Pre-op and Midazolam  Airway Management Planned: Oral ETT  Additional Equipment: None  Intra-op Plan:   Post-operative Plan: Extubation in OR  Informed Consent: I have reviewed the patients History and Physical, chart, labs and discussed the procedure including the risks, benefits and alternatives for the proposed anesthesia with the patient or authorized representative who has indicated his/her understanding and acceptance.     Dental advisory given  Plan Discussed with: CRNA  Anesthesia Plan Comments:         Anesthesia Quick  Evaluation

## 2018-12-11 NOTE — Op Note (Signed)
[facility] Patient Name: Kristen Nichols Procedure Date: 12/11/2018 MRN: [id number] Attending MD: Gerrit Heck , MD Date of Birth: [birth year] CSN: [id number] Age: 63 Admit Type: Inpatient Procedure:                Upper GI endoscopy Indications:              For therapy of reflux esophagitis, For therapy of                            hiatal hernia                           63 yo female with long-standing history of GERD                            complicated by erosive esophagitis and hiatal                            hernia, presents today for Transoral Incisionless                            Fundoplication (TIF). Providers:                Gerrit Heck, MD, Jobe Igo, RN, Lina Sar, Katina Degree CRNA Referring MD:              Medicines:                General Anesthesia Complications:            No immediate complications. Estimated Blood Loss:     Estimated blood loss was minimal. Procedure:                Pre-Anesthesia Assessment:                           - Prior to the procedure, a History and Physical                            was performed, and patient medications and                            allergies were reviewed. The patient's tolerance of                            previous anesthesia was also reviewed. The risks                            and benefits of the procedure and the sedation                            options and risks were discussed with the patient.                            All questions were answered, and informed  consent                            was obtained. Prior Anticoagulants: The patient has                            taken no previous anticoagulant or antiplatelet                            agents. ASA Grade Assessment: II - A patient with                            mild systemic disease. After reviewing the risks                            and benefits, the patient was  deemed in                            satisfactory condition to undergo the procedure.                           After obtaining informed consent, the endoscope was                            passed under direct vision. Throughout the                            procedure, the patient's blood pressure, pulse, and                            oxygen saturations were monitored continuously. The                            GIF-H190 ([id number]) Olympus gastroscope was                            introduced through the mouth, and advanced to the                            second part of duodenum. The upper GI endoscopy was                            accomplished without difficulty. The patient                            tolerated the procedure well. Scope In: Scope Out: Findings:      The upper third of the esophagus and middle third of the esophagus were       normal. The scope was withdrawn. Empiric dilation was performed with a       54 Fr Maloney dilator with mild resistance at 18 cm (UES). The dilation       site was examined following endoscope reinsertion and showed mild       mucosal disruption. Estimated blood loss was minimal.      LA Grade A (  one or more mucosal breaks less than 5 mm, not extending       between tops of 2 mucosal folds) esophagitis with no bleeding was found       38 cm from the incisors. The decision was made to perform transoral       fundoplication with the EsophyX Z+ system. Before the procedure, the       gastroesophageal flap valve was classified as Hill Grade II (fold       present, opens with respiration). The endoscope was withdrawn, placed       through the plication device, reinserted into the patient and advanced       past the level of the GE junction at 38 cm from the incisors and into       the stomach. Next, the endoscope was advanced beyond the device and       retroflexed. The first plication site was identified at the 11 o'clock       position. With the  device in the proper position, the helical retractor       was deployed and tissue was pulled into the mold before it was closed.       The device was rotated, suction was applied using the invaginator, then       the device was advanced slightly and two H-shaped fasteners were placed.       The device was reloaded and the process repeated in order to deploy a       total of six fasteners at the first site. The device was then rotated to       the 1 o'clock position after which the helical retractor was used to       grasp additional tissue within the mold before rotation and deployment       of a total of eight fasteners at the second site. To complete       reconstruction of the valve, additional fasteners were deployed at the       following sites: four fasteners at 5 o'clock and four fasteners at 7       o'clock positions. In total, 22 fasteners contributed to create a valve       measuring 3 cm in length which involved 270 degrees of the circumference       upon retroflexed view. Following the procedure, the flap valve was       reclassified as Hill Grade I (prominent fold, tight to endoscope). The       EsophyX device and endoscope were then removed. Relook endoscopy was       performed prior to the conclusion of the case to confirm the above       findings. Estimated blood loss was minimal.      A 1 cm hiatal hernia was present. This was repaired with Transoral       Incisionless Fundoplication as above. Post operative views demonstrated       resolution of the hernia with a tight appearing GEJ on anterograde and       retroflexed views.      The entire examined stomach was normal.      The duodenal bulb, first portion of the duodenum and second portion of       the duodenum were normal. Impression:               - Normal upper third of esophagus and middle third  of esophagus. Dilated with 54 Fr Maloney dilator                            with an  appropriate mucosal rent noted 18 cm from                            the incisors.                           - LA Grade A reflux esophagitis with no bleeding.                           - 1 cm hiatal hernia. This was repaired with TIF as                            above.                           - Normal stomach.                           - Normal duodenal bulb, first portion of the                            duodenum and second portion of the duodenum.                           - EsophyX transoral fundoplication was performed.                           - No specimens collected. Moderate Sedation:      Not Applicable - Patient had care per Anesthesia. Recommendation:           -Admit to surgical ward for overnight observation                            with anticipated discharge tomorrow                           -Zofran 4 mg IV every 6 hours x24 hours, then prn                           -Reglan 10 mg every 6 hours x24 hours, then prn                           -Resume scopolamine patch x3 days (applied preop)                           -Protonix 40 mg p.o. BID x2 weeks, then 40 mg daily                            x2 weeks, then 20 mg daily x1 week then prn                           -  Decadron 8 mg every 6 hours times max 5 doses                           -Gas-X (simethicone) 4225 mg p.o. prn every 6 hours                            gas pain, abdominal discomfort                           -Tylenol 3 (APAP 120 mg/codeine 12 mg per 5 mL): 15                            mL's every 4 hours prn pain                           -Colace 100 mg p.o. twice daily if taking pain                            medications                           -Clear liquid diet okay overnight                           -Okay to ambulate with assist around the ward                           -Please do not hesitate to contact me directly with                            any postoperative questions or concerns Procedure  Code(s):        --- Professional ---                           [id number], Esophagogastroduodenoscopy, flexible,                            transoral; with esophagogastric fundoplasty,                            partial or complete, includes duodenoscopy when                            performed                           43450, Dilation of esophagus, by unguided sound or                            bougie, single or multiple passes Diagnosis Code(s):        --- Professional ---                           K21.00, Gastro-esophageal reflux disease with  esophagitis, without bleeding                           K44.9, Diaphragmatic hernia without obstruction or                            gangrene CPT copyright 2019 [employer]. All rights reserved. The codes documented in this report are preliminary and upon coder review may  be revised to meet current compliance requirements. Gerrit Heck, MD 12/11/2018 9:16:31 AM Number of Addenda: 0

## 2018-12-12 ENCOUNTER — Telehealth: Payer: Self-pay | Admitting: Gastroenterology

## 2018-12-12 ENCOUNTER — Encounter (HOSPITAL_COMMUNITY): Payer: Self-pay | Admitting: Gastroenterology

## 2018-12-12 DIAGNOSIS — K449 Diaphragmatic hernia without obstruction or gangrene: Secondary | ICD-10-CM | POA: Diagnosis not present

## 2018-12-12 DIAGNOSIS — I10 Essential (primary) hypertension: Secondary | ICD-10-CM | POA: Diagnosis not present

## 2018-12-12 DIAGNOSIS — Z87891 Personal history of nicotine dependence: Secondary | ICD-10-CM | POA: Diagnosis not present

## 2018-12-12 DIAGNOSIS — F418 Other specified anxiety disorders: Secondary | ICD-10-CM | POA: Diagnosis not present

## 2018-12-12 DIAGNOSIS — K2100 Gastro-esophageal reflux disease with esophagitis, without bleeding: Secondary | ICD-10-CM | POA: Diagnosis not present

## 2018-12-12 DIAGNOSIS — Z79899 Other long term (current) drug therapy: Secondary | ICD-10-CM | POA: Diagnosis not present

## 2018-12-12 DIAGNOSIS — Z9889 Other specified postprocedural states: Secondary | ICD-10-CM | POA: Diagnosis not present

## 2018-12-12 MED ORDER — ACETAMINOPHEN-CODEINE 120-12 MG/5ML PO SOLN: 15.0000 mL | ORAL | 0 refills | Status: DC | PRN

## 2018-12-12 MED ORDER — SCOPOLAMINE 1 MG/3DAYS TD PT72: 1.0000 | MEDICATED_PATCH | Freq: Once | TRANSDERMAL | 12 refills | Status: AC

## 2018-12-12 MED ORDER — PANTOPRAZOLE SODIUM 40 MG PO TBEC: 40.0000 mg | DELAYED_RELEASE_TABLET | Freq: Two times a day (BID) | ORAL | 0 refills | Status: DC

## 2018-12-12 MED ORDER — SIMETHICONE 80 MG PO CHEW: 80.0000 mg | CHEWABLE_TABLET | Freq: Four times a day (QID) | ORAL | 0 refills | Status: DC | PRN

## 2018-12-12 NOTE — Telephone Encounter (Signed)
Spoke to patient who states that her current [facility] is out of stock for the Tylenol/ Codeine that was ordered yesterday. A message was sent to dr Bryan Lemma [facility] [address] in [place].

## 2018-12-12 NOTE — Telephone Encounter (Signed)
APAP/Codeine not available at her default pharmacy. Placing medication for new pharmacy per request- [facility] [place], [place]

## 2018-12-12 NOTE — Discharge Summary (Addendum)
[name]  Date of admission: 12/11/2018 Date of discharge: 12/12/2018 Attending: Dr. Bryan Lemma Primary Care provider: [facility] Discharge diagnosis: GERD with erosive esophagitis, hiatal hernia Consultations: None Procedures performed: EGD with Transoral Incisionless Fundoplication (TIF) History: See admission H&P Exam: See below  Hospital course: 1) GERD 2) Hiatal hernia Kristen Nichols is a 63 y.o. female s/p EGD with Transoral Incisionless Fundoplication (TIF) completed on 82/09/9369 without complications. Did well overnight, without any acute events.  Tolerating liquid diet and oral medications without issue.  Discharge vital signs: See below Discharge labs: None Disposition: Home in stable condition Follow-up appointments: See below  Day of Discharge:  Did well overnight, without any acute events.  Did require Tylenol with codeine for esophageal discomfort.  Otherwise, tolerating liquid diet and oral medications without issue.  Objective: Vital signs in last 24 hours: Temp:  [97.7 F (36.5 C)-98.8 F (37.1 C)] 98.5 F (36.9 C) (12/04 0646) Pulse Rate:  [77-90] 88 (12/04 0646) Resp:  [8-18] 18 (12/04 0646) BP: (107-156)/(65-92) 107/65 (12/04 0646) SpO2:  [95 %-100 %] 99 % (12/04 0646) Weight:  [65.8 kg] 65.8 kg (12/03 0735) Last BM Date: 12/09/18 General: NAD Lungs:  CTA b/l, no w/r/r Heart:  RRR, no m/r/g Abdomen: No TTP, no rebound or guarding, no peritoneal signs. Soft, ND, +BS Ext:  No c/c/e    Intake/Output from previous day: 12/03 0701 - 12/04 0700 In: 2006.1 [P.O.:530; I.V.:1376.1; IV Piggyback:100] Out: 6967 [Urine:1800; Blood:5] Intake/Output this shift: No intake/output data recorded.   Lab Results: No results for input(s): WBC, HGB, PLT, MCV in the last 72 hours. BMET No results for input(s): NA, K, CL, CO2, GLUCOSE, BUN, CREATININE, CALCIUM in the last 72 hours. LFT No results for  input(s): PROT, ALBUMIN, AST, ALT, ALKPHOS, BILITOT, BILIDIR, IBILI in the last 72 hours. PT/INR No results for input(s): INR in the last 72 hours.    Imaging/Other results: No results found.    Assessment and Plan:  Kristen Nichols is a 63 y.o. female s/p EGD with Transoral Incisionless Fundoplication (TIF) completed yesterday with no events on overnight observation. Will plan on d/c to home today with the following plan:   - Protonix 40 mg PO BID for 2 weeks, then 40 mg daily for 2 weeks, then 20 mg daily for 1 week, then prn  - D/c with Zofran 4 mg PO prn Q6 hours for nausea  - D/c with Reglan 10 mg PO prn Q6 hours for nausea -DC with Tylenol #3 (APAP 120 mg/codeine 12 mg per 5 mL).  Drink 15 mL every 4 hours as needed for esophageal pain, odynophagia - D/c with Simethicone 80 mg PO prn Q6 hours for bloating/abdominal discomfort - OTC Tylenol as needed for mild postoperative pain  Diet:  - 2 weeks of liquid soft foods followed by 4 weeks slowly progressive diet back to regular  - Provided with handout for post operative diet plan   Post Op Activity:  - Week 1: encourage short distance walking, minimal physical activity, no lifting >5 lbs  - Week 2: Slow climbing stairs, no intense exercise, no lifting >5 lbs  - Week 3-6: No intense exercise, may lift up to 25 lbs  - Week 7: Resume normal activity   - To follow-up with me on 01/05/2019 at 1:40 PM in the [facility] at [facility]  I spent over 30 minutes of face-to-face time with the patient. Greater than 50% of the time was  spent counseling and coordinating care.      Lavena Bullion, DO  12/12/2018, 7:33 AM [facility] Pager [phone]

## 2018-12-12 NOTE — Progress Notes (Signed)
Discharge instructions discussed with patient and family, verbalized agreement and understanding,

## 2018-12-12 NOTE — Discharge Instructions (Signed)
°-   Protonix 40 mg PO BID for 2 weeks, then 40 mg daily for 2 weeks, then 20 mg daily for 1 week, then as needed - Zofran 4 mg PO as needed every 6 hours for nausea  - Simethicone 80 mg PO as needed every 6 hours for bloating/abdominal discomfort -Tylenol with codeine as needed every 4 hours for pain  Diet:  -To advance diet slowly per the post fundoplication diet  Post Op Activity:  - Week 1: encourage short distance walking, minimal physical activity, no lifting >5 lbs  - Week 2: Slow climbing stairs, no intense exercise, no lifting >5 lbs  - Week 3-6: No intense exercise, may lift up to 25 lbs  - Week 7: Resume normal activity   - To follow-up with me on 01/05/2019 at 1:40 PM in the [facility] at [facility]

## 2019-01-05 ENCOUNTER — Ambulatory Visit: Payer: Federal, State, Local not specified - PPO | Admitting: Gastroenterology

## 2019-01-05 ENCOUNTER — Other Ambulatory Visit: Payer: Self-pay

## 2019-01-05 ENCOUNTER — Encounter: Payer: Self-pay | Admitting: Gastroenterology

## 2019-01-05 VITALS — BP 122/82 | HR 88 | Temp 98.2°F | Ht 62.0 in | Wt 142.4 lb

## 2019-01-05 DIAGNOSIS — Z9889 Other specified postprocedural states: Secondary | ICD-10-CM

## 2019-01-05 DIAGNOSIS — K5909 Other constipation: Secondary | ICD-10-CM | POA: Diagnosis not present

## 2019-01-05 DIAGNOSIS — K2100 Gastro-esophageal reflux disease with esophagitis, without bleeding: Secondary | ICD-10-CM | POA: Diagnosis not present

## 2019-01-05 DIAGNOSIS — K449 Diaphragmatic hernia without obstruction or gangrene: Secondary | ICD-10-CM | POA: Diagnosis not present

## 2019-01-05 NOTE — Patient Instructions (Addendum)
Please start taking a daily fiber supplement such as citrucel, benefiber, metamucil, or fiber choice.     It was a pleasure to see you today!

## 2019-01-05 NOTE — Progress Notes (Signed)
P  Chief Complaint:    GERD, post TIF f/u  GI History: 63 year old female initially seen by me on 02/19/2018 for alternating bowel habits, abdominal pain, and reflux symptoms, now s/p Transoral Incisionless Fundoplication (TIF) 08/14/5782.  Longstanding history of GERD, complicated by erosive esophagitis.  Index symptoms ofheartburnand regurgitation, belch.More often at night.  No dysphagiaor odynophagia.  Previous intolerance to omeprazoledaily (nausea), so she was only taking intermittently.EGD in 02/2018 with LA grade A esophagitis, 1 cm hiatal hernia with a Hill grade 2 valve. Prescribed Prilosec 20 mg p.o. twice daily x8 weeks with good clinical response to therapy, but still with intermittent breakthrough sxs. She eventually tritrated off due to nausea with PPI.  Completed TIF on 12/11/2018.  Endoscopic history: -EGD with Transoral Incisionless Fundoplication (69/6295, Dr. Bryan Lemma): LA Grade A esophagitis, 1 cm HH, 22 fasteners placed with 270 degree valve 3 cm in length -EGD (2020, Dr. Bryan Lemma): 1 cm hiatal hernia, LA grade A esophagitis, Hill grade 2 valve, normal stomach and duodenum. -Colonoscopy (2020, Dr. Bryan Lemma): 3 small, 2 to 3 mm, Hyperplastic polyps, sigmoid diverticulosis, otherwise normal with biopsies negative for [facility]. Recommended repeat in 10 years. -Colonoscopy (2014, [facility]):5 mm polyp in cecum and 3 mm polyp in transverse colon (path polypoid colonic mucosa with benign reactive lymphoid hyperplasia), normal TI, sigmoid diverticulosis -Colonoscopy (2012, [place]): No report available for review. Patient reports polyps  HPI:     Patient is a 63 y.o. female presenting to the [facility]ology [facility] for follow-up.  TIF completed earlier this month without issue.  She has been doing well since then, tolerating postoperative diet and medications without issue.  Does c/o some expected postoperative chest discomfort, but  not frank pain.  Very happy that she underwent the procedure.  Sleeping much better, without any nocturnal regurgitation or choking sensation.  Tolerating liquid/bland diet.  Still on bid PPI, with plan to titrate this week.  Does c/o constipation since the procedure.  Review of systems:     No chest pain, no SOB, no fevers, no urinary sx   Past Medical History:  Diagnosis Date  . Allergy    Hayfever  . Anxiety   . Benign essential hypertension 10/13/2012   Last Assessment & Plan:  Well controlled.    . Colitis   . Diverticulosis   . GERD (gastroesophageal reflux disease)   . Headache(784.0)   . History of colon polyps   . Hypertension   . IBS (irritable bowel syndrome)   . PONV (postoperative nausea and vomiting)    after ankle surgery in 1994  . UC (ulcerative colitis) ([place])     Patient's surgical history, family medical history, social history, medications and allergies were all reviewed in Epic    Current Outpatient Medications  Medication Sig Dispense Refill  . acetaminophen-codeine 120-12 MG/5ML solution Take 15 mLs by mouth every 4 (four) hours as needed for severe pain. 120 mL 0  . acetaminophen-codeine 120-12 MG/5ML solution Take 15 mLs by mouth every 4 (four) hours as needed for moderate pain. 120 mL 0  . buPROPion (WELLBUTRIN XL) 150 MG 24 hr tablet TAKE 1 TABLET (150 MG TOTAL) BY MOUTH DAILY. DUE FOR FOLLOW UP VISIT W/PCP 90 tablet 1  . diclofenac sodium (VOLTAREN) 1 % GEL Apply 4 g topically 4 (four) times daily. To affected joint. (Patient taking differently: Apply 4 g topically 4 (four) times daily as needed (pain). To affected joint.) 100 g 11  . fexofenadine (ALLEGRA) 180  MG tablet Take 180 mg by mouth daily as needed for allergies or rhinitis.    . Fluoxetine HCl, PMDD, 20 MG TABS TAKE 1 TABLET (20 MG TOTAL) BY MOUTH DAILY. DUE FOR FOLLOW UP VISIT W/PCP (Patient taking differently: Take 20 mg by mouth daily. ) 90 tablet 1  . fluticasone (FLONASE) 50 MCG/ACT nasal  spray SPRAY 2 SPRAYS INTO EACH NOSTRIL EVERY DAY (Patient taking differently: Place 2 sprays into both nostrils daily as needed for allergies. ) 16 g 0  . hydrochlorothiazide (HYDRODIURIL) 25 MG tablet Take 1 tablet (25 mg total) by mouth daily. 90 tablet 1  . ibuprofen (ADVIL,MOTRIN) 200 MG tablet Take 600-800 mg by mouth every 6 (six) hours as needed for moderate pain.    . pantoprazole (PROTONIX) 40 MG tablet Take 1 tablet (40 mg total) by mouth 2 (two) times daily for 28 days. Take 1 tab BID for 2 weeks, then reduce to 1 tab daily for 2 weeks, then take only as needed. Take 30-60 minutes before meal 56 tablet 0  . Propylene Glycol (SYSTANE BALANCE) 0.6 % SOLN Place 1 drop into both eyes 2 (two) times daily as needed (dry eyes).    . simethicone (MYLICON) 80 MG chewable tablet Chew 1 tablet (80 mg total) by mouth every 6 (six) hours as needed for flatulence (gas, bloating, abdominal discomfort). 30 tablet 0   No current facility-administered medications for this visit.    Physical Exam:     Temp 98.2 F (36.8 C)   Ht 5' 2"  (1.575 m)   Wt 142 lb 6 oz (64.6 kg)   BMI 26.04 kg/m   GENERAL:  Pleasant female in NAD PSYCH: : Cooperative, normal affect EENT:  conjunctiva pink, mucous membranes moist, neck supple without masses CARDIAC:  RRR, no murmur heard, no peripheral edema PULM: Normal respiratory effort, lungs CTA bilaterally, no wheezing ABDOMEN:  Nondistended, soft, nontender. No obvious masses, no hepatomegaly,  normal bowel sounds SKIN:  turgor, no lesions seen Musculoskeletal:  Normal muscle tone, normal strength NEURO: Alert and oriented x 3, no focal neurologic deficits   IMPRESSION and PLAN:    1) GERD with erosive esophagitis corrected with TIF 2) History of hiatal hernia corrected with TIF  -Doing well postoperatively with some expected postoperative chest discomfort, otherwise no issues -Decrease Protonix to 40 mg/daily x2 weeks then 20 mg/day, then to prn  only -Continue to increase diet per postoperative protocol -Continue increasing activity per postop protocol.  She is nearly 4 weeks out from surgery, so no issue with resuming horseback riding as requested -Okay to reintroduce caffeine.  Holding off on carbonated beverages for now  3) Constipation: Suspect 2/2 altered postoperative diet, particularly with reduced fiber intake. -Continue adequate fluids -Start fiber supplement -If no significant improvement despite fiber supplement, plan to add MiraLAX and titrate to soft stools without straining to have BM -Not taking any pain medications   RTC in 3 months or sooner as needed  I spent a total of 25 minutes of face-to-face time with the patient. Greater than 50% of the time was spent counseling and coordinating care.         [place] ,DO, FACG 01/05/2019, 1:45 PM

## 2019-01-19 ENCOUNTER — Telehealth: Payer: Self-pay | Admitting: Gastroenterology

## 2019-01-19 DIAGNOSIS — K449 Diaphragmatic hernia without obstruction or gangrene: Secondary | ICD-10-CM

## 2019-01-19 DIAGNOSIS — R110 Nausea: Secondary | ICD-10-CM

## 2019-01-19 MED ORDER — ONDANSETRON HCL 4 MG PO TABS: 4.0000 mg | ORAL_TABLET | ORAL | 1 refills | Status: DC | PRN

## 2019-01-19 NOTE — Telephone Encounter (Signed)
Called and spoke with patient-nausea started yesterday-did not eat anything "different"; (worse today);  Not tried OTC -has pepto bismol can use; has scop patches she can use Felt "more heavy"-taking Miralax every day, feel constipated  When she feels "different"-goes back to liquids until feels better-behind on progressing with diet to to "different feelings" when eating

## 2019-01-19 NOTE — Telephone Encounter (Signed)
Called and spoke with patient-patient informed of MD recommendations; patient is agreeable with plan of care and verified pharmacy; RX sent to pharmacy;  Patient verbalized understanding of information/instructions;  Patient was advised to call the office at [phone] if questions/concerns arise;

## 2019-01-19 NOTE — Telephone Encounter (Signed)
Agree with management plan as you outlined. If still with nausea, however, please send in Rx for Zofran 4 mg PO prn every 4-6 hours for nausea, #30, Rf1. As we want to avoid emesis in the post op setting.

## 2019-03-05 ENCOUNTER — Other Ambulatory Visit: Payer: Self-pay | Admitting: Family Medicine

## 2019-04-01 ENCOUNTER — Telehealth: Payer: Self-pay | Admitting: General Practice

## 2019-04-01 MED ORDER — FLUOXETINE HCL (PMDD) 20 MG PO TABS: ORAL_TABLET | ORAL | 0 refills | Status: DC

## 2019-04-01 MED ORDER — BUPROPION HCL ER (XL) 150 MG PO TB24: 150.0000 mg | ORAL_TABLET | Freq: Every day | ORAL | 0 refills | Status: DC

## 2019-04-01 NOTE — Telephone Encounter (Signed)
Received a fax from [facility] patient was  requesting a 90 day refill of her Prozac and Bupropion XL. She is due for a follow up with PCP. Per Dr. Zigmund Daniel 30 days can be given and then she will need a follow up.

## 2019-04-12 ENCOUNTER — Other Ambulatory Visit: Payer: Self-pay | Admitting: Family Medicine

## 2019-04-17 ENCOUNTER — Encounter: Payer: Self-pay | Admitting: Gastroenterology

## 2019-04-17 ENCOUNTER — Other Ambulatory Visit: Payer: Self-pay

## 2019-04-17 ENCOUNTER — Ambulatory Visit: Payer: Federal, State, Local not specified - PPO | Admitting: Gastroenterology

## 2019-04-17 VITALS — BP 118/78 | HR 97 | Temp 97.5°F | Ht 62.0 in | Wt 133.5 lb

## 2019-04-17 DIAGNOSIS — K581 Irritable bowel syndrome with constipation: Secondary | ICD-10-CM | POA: Diagnosis not present

## 2019-04-17 DIAGNOSIS — Z9889 Other specified postprocedural states: Secondary | ICD-10-CM | POA: Diagnosis not present

## 2019-04-17 DIAGNOSIS — Z8719 Personal history of other diseases of the digestive system: Secondary | ICD-10-CM

## 2019-04-17 NOTE — Patient Instructions (Signed)
Follow up in 12 months  It was a pleasure to see you today!  Vito Cirigliano, D.O.

## 2019-04-17 NOTE — Progress Notes (Signed)
P  Chief Complaint:    Postoperative follow-up  GI History: 64 year old female initially seen by me on 02/19/2018 for alternating bowel habits, abdominal pain, and reflux symptoms, now s/p Transoral Incisionless Fundoplication (TIF) 96/02/2295.  Longstanding history of GERD, complicated by erosive esophagitis. Index symptoms ofheartburnand regurgitation, belch.More often at night. No dysphagiaor odynophagia.Previous intolerance to omeprazoledaily(nausea), so shewasonly takingintermittently.EGD in 02/2018 with LA grade A esophagitis, 1 cm hiatal hernia with a Hill grade 2 valve. Prescribed Prilosec 20 mg p.o. twice daily x8 weeks withgood clinical response to therapy, but still with intermittent breakthrough sxs. She eventually tritrated off due to nausea with PPI. Completed TIF on 12/11/2018 with resolution of index reflux symptoms.  Endoscopic history: -EGD with Transoral Incisionless Fundoplication (98/9211, Dr. Bryan Lemma): LA Grade A esophagitis, 1 cm HH, 22 fasteners placed with 270 degree valve 3 cm in length -EGD (2020, Dr. Bryan Lemma): 1 cm hiatal hernia, LA grade A esophagitis, Hill grade 2 valve, normal stomach and duodenum. -Colonoscopy (2020, Dr. Bryan Lemma): 3 small, 2 to 3 mm, Hyperplastic polyps, sigmoid diverticulosis, otherwise normal with biopsies negative for [facility]. Recommended repeat in 10 years. -Colonoscopy (2014, [facility]):5 mm polyp in cecum and 3 mm polyp in transverse colon (path polypoid colonic mucosa with benign reactive lymphoid hyperplasia), normal TI, sigmoid diverticulosis -Colonoscopy (2012, [place]): No report available for review. Patient reports polyps  HPI:     Patient is a 64 y.o. female presenting to the Gastroenterology Clinic for follow-up.  Last seen by me on 01/05/2019.  Was doing well at that time and has since weaned off all PPI therapy without breakthrough reflux symptoms. Does have belching,  but no regurgitation, HB, etc. states she feels "great" and very relieved to have the procedure done.  Sleeping flat, no issues with forward flexion inducing regurgitation, can eat spicy food again.  Does have some issue from time to time with red meat and bread, but otherwise no dysphagia issues.  This is minimally bothersome to her.   Review of systems:     No chest pain, no SOB, no fevers, no urinary sx   Past Medical History:  Diagnosis Date  . Allergy    Hayfever  . Anxiety   . Benign essential hypertension 10/13/2012   Last Assessment & Plan:  Well controlled.    . Colitis   . Diverticulosis   . GERD (gastroesophageal reflux disease)   . Headache(784.0)   . History of colon polyps   . Hypertension   . IBS (irritable bowel syndrome)   . PONV (postoperative nausea and vomiting)    after ankle surgery in 1994  . UC (ulcerative colitis) ([place])     Patient's surgical history, family medical history, social history, medications and allergies were all reviewed in Epic    Current Outpatient Medications  Medication Sig Dispense Refill  . buPROPion (WELLBUTRIN XL) 150 MG 24 hr tablet Take 1 tablet (150 mg total) by mouth daily. Due for follow up visit w/PCP 30 tablet 0  . diclofenac sodium (VOLTAREN) 1 % GEL Apply 4 g topically 4 (four) times daily. To affected joint. (Patient taking differently: Apply 4 g topically 4 (four) times daily as needed (pain). To affected joint.) 100 g 11  . fexofenadine (ALLEGRA) 180 MG tablet Take 180 mg by mouth daily as needed for allergies or rhinitis.    Marland Kitchen FIBER PO Take by mouth daily. 1 Tablespoon daily    . Fluoxetine HCl, PMDD, 20 MG TABS TAKE 1 TABLET (  20 MG TOTAL) BY MOUTH DAILY. DUE FOR FOLLOW UP VISIT W/PCP 30 tablet 0  . fluticasone (FLONASE) 50 MCG/ACT nasal spray SPRAY 2 SPRAYS INTO EACH NOSTRIL EVERY DAY (Patient taking differently: Place 2 sprays into both nostrils daily as needed for allergies. ) 16 g 0  . hydrochlorothiazide (HYDRODIURIL)  25 MG tablet Take 1 tablet (25 mg total) by mouth daily. 90 tablet 1  . ibuprofen (ADVIL,MOTRIN) 200 MG tablet Take 600-800 mg by mouth every 6 (six) hours as needed for moderate pain.    Marland Kitchen Propylene Glycol (SYSTANE BALANCE) 0.6 % SOLN Place 1 drop into both eyes 2 (two) times daily as needed (dry eyes).    . simethicone (MYLICON) 80 MG chewable tablet Chew 1 tablet (80 mg total) by mouth every 6 (six) hours as needed for flatulence (gas, bloating, abdominal discomfort). (Patient not taking: Reported on 04/17/2019) 30 tablet 0   No current facility-administered medications for this visit.    Physical Exam:     BP 118/78   Pulse 97   Temp (!) 97.5 F (36.4 C)   Ht 5' 2"  (1.575 m)   Wt 133 lb 8 oz (60.6 kg)   BMI 24.42 kg/m   GENERAL:  Pleasant female in NAD PSYCH: : Cooperative, normal affect EENT:  conjunctiva pink, mucous membranes moist, neck supple without masses CARDIAC:  RRR, no murmur heard, no peripheral edema PULM: Normal respiratory effort, lungs CTA bilaterally, no wheezing ABDOMEN:  Nondistended, soft, nontender. No obvious masses, no hepatomegaly,  normal bowel sounds SKIN:  turgor, no lesions seen Musculoskeletal:  Normal muscle tone, normal strength NEURO: Alert and oriented x 3, no focal neurologic deficits   IMPRESSION and PLAN:    1) GERD with erosive esophagitis corrected with TIF 2) History of hiatal hernia corrected with TIF  Has had complete resolution of reflux symptoms since TIF. Response, and has since weaned off PPI therapy and tolerating p.o. intake.  3) Constipation: Suspect 2/2 altered postoperative diet, particularly with reduced fiber intake.  Improving. -Continue adequate fluids -Fiber supplement   RTC in 12 months or sooner as needed     I spent 25 minutes of time, including independent review of results as outlined above, communicating results with the patient directly, face-to-face time with the patient, coordinating care, ordering  studies and medications as appropriate, and documentation.        Lavena Bullion ,DO, FACG 04/17/2019, 1:33 PM

## 2019-04-30 ENCOUNTER — Encounter: Payer: Federal, State, Local not specified - PPO | Admitting: Osteopathic Medicine

## 2019-05-07 ENCOUNTER — Encounter: Payer: Self-pay | Admitting: Osteopathic Medicine

## 2019-05-07 ENCOUNTER — Other Ambulatory Visit: Payer: Self-pay

## 2019-05-07 ENCOUNTER — Ambulatory Visit (INDEPENDENT_AMBULATORY_CARE_PROVIDER_SITE_OTHER): Payer: Federal, State, Local not specified - PPO | Admitting: Osteopathic Medicine

## 2019-05-07 ENCOUNTER — Ambulatory Visit (INDEPENDENT_AMBULATORY_CARE_PROVIDER_SITE_OTHER): Payer: Federal, State, Local not specified - PPO

## 2019-05-07 VITALS — BP 129/84 | HR 94 | Temp 98.4°F | Wt 134.0 lb

## 2019-05-07 DIAGNOSIS — I10 Essential (primary) hypertension: Secondary | ICD-10-CM

## 2019-05-07 DIAGNOSIS — J309 Allergic rhinitis, unspecified: Secondary | ICD-10-CM

## 2019-05-07 DIAGNOSIS — E785 Hyperlipidemia, unspecified: Secondary | ICD-10-CM

## 2019-05-07 DIAGNOSIS — Z0000 Encounter for general adult medical examination without abnormal findings: Secondary | ICD-10-CM | POA: Diagnosis not present

## 2019-05-07 DIAGNOSIS — M79674 Pain in right toe(s): Secondary | ICD-10-CM | POA: Diagnosis not present

## 2019-05-07 DIAGNOSIS — F325 Major depressive disorder, single episode, in full remission: Secondary | ICD-10-CM

## 2019-05-07 DIAGNOSIS — M79671 Pain in right foot: Secondary | ICD-10-CM

## 2019-05-07 DIAGNOSIS — K2100 Gastro-esophageal reflux disease with esophagitis, without bleeding: Secondary | ICD-10-CM

## 2019-05-07 NOTE — Patient Instructions (Signed)
General Preventive Care  Most recent routine screening labs: ordered.   Everyone should have blood pressure checked once per year. Goal 130/80 or less.   Tobacco: don't!   Alcohol: sponsible moderation is ok for most adults - if you have concerns about your alcohol intake, please talk to me!   Exercise: as tolerated to reduce risk of cardiovascular disease and diabetes. Strength training will also prevent osteoporosis.   Mental health: if need for mental health care (medicines, counseling, other), or concerns about moods, please let me know!   Sexual health: if need for STD testing, or if concerns with libido/pain problems, please let me know!   Advanced Directive: Living Will and/or Healthcare Power of Attorney recommended for all adults, regardless of age or health.  Vaccines  Flu vaccine: for almost everyone, every fall.   Shingles vaccine: after age 51.   Pneumonia vaccines: after age 107  Tetanus booster: every 10 years   COVID vaccine: STRONGLY RECOMMENDED  Cancer screenings   Colon cancer screening: for everyone age 71-75. Colonoscopy due 2030  Breast cancer screening: mammogram due 11/2019  Cervical cancer screening: Pap every 1 to 5 years depending on age and other risk factors. Due 01/2023. Can usually stop at age 20 or w/ hysterectomy.   Lung cancer screening: not needed given light smoking history and low risk  Infection screenings  . HIV: recommended screening at least once age 23-65 . Gonorrhea/Chlamydia: screening as needed . Hepatitis C: recommended once for everyone age 74-75 - done, repeat as needed . TB: certain at-risk populations, or depending on work requirements and/or travel history Other . Bone Density Test: recommended for women at age 59

## 2019-05-07 NOTE — Progress Notes (Signed)
Kristen Nichols is a 64 y.o. female who presents to  [place] at [facility]  today, 05/07/19, seeking care for the following: . Annual physical  . Occasional foot pain - base of R 3/4/5 toes, intermittent, no injury     ASSESSMENT & PLAN with other pertinent history/findings:  The primary encounter diagnosis was Annual physical exam. Diagnoses of Benign essential hypertension, Allergic rhinitis, unspecified seasonality, unspecified trigger, Gastroesophageal reflux disease with esophagitis without hemorrhage, Depression, major, single episode, complete remission ([place]), Dyslipidemia, and Right foot pain were also pertinent to this visit.   Orders Placed This Encounter  Procedures  . DG Foot Complete Right  . CBC  . Lipid panel  . COMPLETE METABOLIC PANEL WITH GFR    No orders of the defined types were placed in this encounter.   General Preventive Care  Most recent routine screening labs: ordered.   Everyone should have blood pressure checked once per year. Goal 130/80 or less.   Tobacco: don't!   Alcohol: sponsible moderation is ok for most adults - if you have concerns about your alcohol intake, please talk to me!   Exercise: as tolerated to reduce risk of cardiovascular disease and diabetes. Strength training will also prevent osteoporosis.   Mental health: if need for mental health care (medicines, counseling, other), or concerns about moods, please let me know!   Sexual health: if need for STD testing, or if concerns with libido/pain problems, please let me know!   Advanced Directive: Living Will and/or Healthcare Power of Attorney recommended for all adults, regardless of age or health.  Vaccines  Flu vaccine: for almost everyone, every fall.   Shingles vaccine: after age 52.   Pneumonia vaccines: after age 12  Tetanus booster: every 10 years   COVID vaccine: STRONGLY RECOMMENDED  Cancer screenings   Colon  cancer screening: for everyone age 35-75. Colonoscopy due 2030  Breast cancer screening: mammogram due 11/2019  Cervical cancer screening: Pap every 1 to 5 years depending on age and other risk factors. Due 01/2023. Can usually stop at age 31 or w/ hysterectomy.   Lung cancer screening: not needed given light smoking history and low risk  Infection screenings  . HIV: recommended screening at least once age 51-65 . Gonorrhea/Chlamydia: screening as needed . Hepatitis C: recommended once for everyone age 90-75 - done, repeat as needed . TB: certain at-risk populations, or depending on work requirements and/or travel history Other . Bone Density Test: recommended for women at age 53  Constitutional:  . VSS, see nurse notes . General Appearance: alert, well-developed, well-nourished, NAD Eyes: Marland Kitchen Normal lids and conjunctive, non-icteric sclera . PERRLA Ears, Nose, Mouth, Throat: . Normal appearance . Normal external auditory canal and TM bilaterally . MMM, posterior pharynx without erythema/exudate Neck: . No masses, trachea midline . No thyroid enlargement/tenderness/mass appreciated Respiratory: . Normal respiratory effort . Breath sounds normal, no wheeze/rhonchi/rales Cardiovascular: . S1/S2 normal, no murmur/rub/gallop auscultated . No carotid bruit or JVD . Pedal pulse II/IV bilaterally DP and PT . No lower extremity edema Gastrointestinal: . Nontender, no masses . No hepatomegaly, no splenomegaly . No hernia appreciated Musculoskeletal:  . Gait normal, foot exam normal at area of concern R 3/4/5 MTP joints, good capillary refill but (+)purplish raynauds-type appearance  . No clubbing/cyanosis of digits Neurological: . No cranial nerve deficit on limited exam . Motor and sensation intact and symmetric Psychiatric: . Normal judgment/insight . Normal mood and affect  Follow-up instructions: Return in about 1 year (around 05/06/2020) for [place] (call week prior to  visit for lab orders).                                         BP 129/84 (BP Location: Left Arm, Patient Position: Sitting, Cuff Size: Normal)   Pulse 94   Temp 98.4 F (36.9 C) (Oral)   Wt 134 lb 0.6 oz (60.8 kg)   BMI 24.52 kg/m   Current Meds  Medication Sig  . buPROPion (WELLBUTRIN XL) 150 MG 24 hr tablet Take 1 tablet (150 mg total) by mouth daily. Due for follow up visit w/PCP  . diclofenac sodium (VOLTAREN) 1 % GEL Apply 4 g topically 4 (four) times daily. To affected joint. (Patient taking differently: Apply 4 g topically 4 (four) times daily as needed (pain). To affected joint.)  . fexofenadine (ALLEGRA) 180 MG tablet Take 180 mg by mouth daily as needed for allergies or rhinitis.  Marland Kitchen FIBER PO Take by mouth daily. 1 Tablespoon daily  . Fluoxetine HCl, PMDD, 20 MG TABS TAKE 1 TABLET (20 MG TOTAL) BY MOUTH DAILY. DUE FOR FOLLOW UP VISIT W/PCP  . fluticasone (FLONASE) 50 MCG/ACT nasal spray SPRAY 2 SPRAYS INTO EACH NOSTRIL EVERY DAY (Patient taking differently: Place 2 sprays into both nostrils daily as needed for allergies. )  . hydrochlorothiazide (HYDRODIURIL) 25 MG tablet Take 1 tablet (25 mg total) by mouth daily.  Marland Kitchen ibuprofen (ADVIL,MOTRIN) 200 MG tablet Take 600-800 mg by mouth every 6 (six) hours as needed for moderate pain.  Marland Kitchen Propylene Glycol (SYSTANE BALANCE) 0.6 % SOLN Place 1 drop into both eyes 2 (two) times daily as needed (dry eyes).  . simethicone (MYLICON) 80 MG chewable tablet Chew 1 tablet (80 mg total) by mouth every 6 (six) hours as needed for flatulence (gas, bloating, abdominal discomfort).    No results found for this or any previous visit (from the past 72 hour(s)).  DG Foot Complete Right  Result Date: 05/07/2019 CLINICAL DATA:  Toe pain EXAM: RIGHT FOOT COMPLETE - 3+ VIEW COMPARISON:  08/26/2018 FINDINGS: There is no evidence of fracture or dislocation. There is no evidence of arthropathy or other focal bone  abnormality. Soft tissues are unremarkable. IMPRESSION: Negative. Electronically Signed   By: Donavan Foil M.D.   On: 05/07/2019 23:36        All questions at time of visit were answered - patient instructed to contact office with any additional concerns or updates.  ER/RTC precautions were reviewed with the patient.  Please note: voice recognition software was used to produce this document, and typos may escape review. Please contact Dr. Sheppard Coil fny needed clarifications.

## 2019-05-08 ENCOUNTER — Other Ambulatory Visit: Payer: Self-pay | Admitting: Family Medicine

## 2019-05-08 DIAGNOSIS — K582 Mixed irritable bowel syndrome: Secondary | ICD-10-CM

## 2019-05-08 DIAGNOSIS — Z0000 Encounter for general adult medical examination without abnormal findings: Secondary | ICD-10-CM | POA: Diagnosis not present

## 2019-05-08 NOTE — Telephone Encounter (Signed)
Last written 04/23/18 and not written by Dr Sheppard Coil  Not on current med list   Please advise

## 2019-05-13 LAB — COMPLETE METABOLIC PANEL WITH GFR
AG Ratio: 1.9 (calc) (ref 1.0–2.5)
ALT: 23 U/L (ref 6–29)
AST: 20 U/L (ref 10–35)
Albumin: 4.2 g/dL (ref 3.6–5.1)
Alkaline phosphatase (APISO): 78 U/L (ref 37–153)
BUN: 16 mg/dL (ref 7–25)
CO2: 28 mmol/L (ref 20–32)
Calcium: 9.4 mg/dL (ref 8.6–10.4)
Chloride: 102 mmol/L (ref 98–110)
Creat: 0.83 mg/dL (ref 0.50–0.99)
GFR, Est African American: 88 mL/min/{1.73_m2} (ref >=60)
GFR, Est Non African American: 76 mL/min/{1.73_m2} (ref >=60)
Globulin: 2.2 g/dL (calc) (ref 1.9–3.7)
Glucose, Bld: 101 mg/dL — ABNORMAL HIGH (ref 65–99)
Potassium: 4.6 mmol/L (ref 3.5–5.3)
Sodium: 140 mmol/L (ref 135–146)
Total Bilirubin: 0.5 mg/dL (ref 0.2–1.2)
Total Protein: 6.4 g/dL (ref 6.1–8.1)

## 2019-05-13 LAB — TEST AUTHORIZATION

## 2019-05-13 LAB — LIPID PANEL
Cholesterol: 190 mg/dL (ref <200)
HDL: 62 mg/dL (ref >=50)
LDL Cholesterol (Calc): 111 mg/dL (calc) — ABNORMAL HIGH
Non-HDL Cholesterol (Calc): 128 mg/dL (calc) (ref <130)
Total CHOL/HDL Ratio: 3.1 (calc) (ref <5.0)
Triglycerides: 78 mg/dL (ref <150)

## 2019-05-13 LAB — CBC
HCT: 42 % (ref 35.0–45.0)
Hemoglobin: 14.1 g/dL (ref 11.7–15.5)
MCH: 30.7 pg (ref 27.0–33.0)
MCHC: 33.6 g/dL (ref 32.0–36.0)
MCV: 91.5 fL (ref 80.0–100.0)
MPV: 9.8 fL (ref 7.5–12.5)
Platelets: 353 10*3/uL (ref 140–400)
RBC: 4.59 10*6/uL (ref 3.80–5.10)
RDW: 11.8 % (ref 11.0–15.0)
WBC: 7.9 10*3/uL (ref 3.8–10.8)

## 2019-05-13 LAB — HEMOGLOBIN A1C
Hgb A1c MFr Bld: 4.7 % of total Hgb (ref <5.7)
Mean Plasma Glucose: 88 (calc)
eAG (mmol/L): 4.9 (calc)

## 2019-06-12 ENCOUNTER — Other Ambulatory Visit: Payer: Self-pay | Admitting: Family Medicine

## 2019-06-12 NOTE — Telephone Encounter (Signed)
Last OV 05/07/19 Last refill(s) by Luetta Nutting 04/01/19 #30/0 Next OV not scheduled

## 2019-07-11 ENCOUNTER — Other Ambulatory Visit: Payer: Self-pay | Admitting: Family Medicine

## 2020-01-18 ENCOUNTER — Encounter: Payer: Self-pay | Admitting: Osteopathic Medicine

## 2020-01-18 NOTE — Telephone Encounter (Signed)
Appt scheduled with Dr A for 01/19/20. AM

## 2020-01-18 NOTE — Telephone Encounter (Signed)
Please call patient to schedule in-office appointment with Dr. Sheppard Coil.

## 2020-01-19 ENCOUNTER — Other Ambulatory Visit: Payer: Self-pay

## 2020-01-19 ENCOUNTER — Ambulatory Visit (INDEPENDENT_AMBULATORY_CARE_PROVIDER_SITE_OTHER): Payer: Federal, State, Local not specified - PPO | Admitting: Osteopathic Medicine

## 2020-01-19 ENCOUNTER — Encounter: Payer: Self-pay | Admitting: Osteopathic Medicine

## 2020-01-19 VITALS — BP 134/85 | HR 95 | Temp 97.8°F | Wt 133.1 lb

## 2020-01-19 DIAGNOSIS — B029 Zoster without complications: Secondary | ICD-10-CM | POA: Diagnosis not present

## 2020-01-19 DIAGNOSIS — E785 Hyperlipidemia, unspecified: Secondary | ICD-10-CM | POA: Diagnosis not present

## 2020-01-19 DIAGNOSIS — I10 Essential (primary) hypertension: Secondary | ICD-10-CM | POA: Diagnosis not present

## 2020-01-19 DIAGNOSIS — Z0000 Encounter for general adult medical examination without abnormal findings: Secondary | ICD-10-CM

## 2020-01-19 DIAGNOSIS — N644 Mastodynia: Secondary | ICD-10-CM

## 2020-01-19 MED ORDER — VALACYCLOVIR HCL 1 G PO TABS: 1000.0000 mg | ORAL_TABLET | Freq: Three times a day (TID) | ORAL | 0 refills | Status: AC

## 2020-01-19 MED ORDER — LIDOCAINE-PRILOCAINE 2.5-2.5 % EX CREA: 1.0000 "application " | TOPICAL_CREAM | CUTANEOUS | 1 refills | Status: DC | PRN

## 2020-01-19 NOTE — Patient Instructions (Addendum)
I think this might be Shingles! See below for more info on this condition Medications: antivirals (Valtrex aka valacyclovir) and numbing cream (Emla)  Will see if we can get the mammogram +/- ultrasound moved up just in case this isn't shingles (not responding to Rx)  Let me know if anything changes or gets worse!      Orders are in for annual lab work! Please get fasting blood drawn at least 2 days before your appointment. (No food or anything to drink besides water or black coffee, 8 hours prior to blood draw. Take all medications as usual). Lab is in room 135 in the [facility] building. Open 7-5 weekdays, no appointment needed.        Shingles  Shingles, which is also known as herpes zoster, is an infection that causes a painful skin rash and fluid-filled blisters. It is caused by a virus. Shingles only develops in people who:  Have had chickenpox.  Have been given a medicine to protect against chickenpox (have been vaccinated). Shingles is rare in this group. What are the causes? Shingles is caused by varicella-zoster virus (VZV). This is the same virus that causes chickenpox. After a person is exposed to VZV, the virus stays in the body in an inactive (dormant) state. Shingles develops if the virus is reactivated. This can happen many years after the first (initial) exposure to VZV. It is not known what causes this virus to be reactivated. What increases the risk? People who have had chickenpox or received the chickenpox vaccine are at risk for shingles. Shingles infection is more common in people who:  Are older than age 60.  Have a weakened disease-fighting system (immune system), such as people with: ? HIV. ? AIDS. ? Cancer.  Are taking medicines that weaken the immune system, such as transplant medicines.  Are experiencing a lot of stress. What are the signs or symptoms? Early symptoms of this condition include itching, tingling, and pain in an area on your skin.  Pain may be described as burning, stabbing, or throbbing. A few days or weeks after early symptoms start, a painful red rash appears. The rash is usually on one side of the body and has a band-like or belt-like pattern. The rash eventually turns into fluid-filled blisters that break open, change into scabs, and dry up in about 2-3 weeks. At any time during the infection, you may also develop:  A fever.  Chills.  A headache.  An upset stomach. How is this diagnosed? This condition is diagnosed with a skin exam. Skin or fluid samples may be taken from the blisters before a diagnosis is made. These samples are examined under a microscope or sent to a lab for testing. How is this treated? The rash may last for several weeks. There is not a specific cure for this condition. Your health care provider will probably prescribe medicines to help you manage pain, recover more quickly, and avoid long-term problems. Medicines may include:  Antiviral drugs.  Anti-inflammatory drugs.  Pain medicines.  Anti-itching medicines (antihistamines). If the area involved is on your face, you may be referred to a specialist, such as an eye doctor (ophthalmologist) or an ear, nose, and throat (ENT) doctor (otolaryngologist) to help you avoid eye problems, chronic pain, or disability. Follow these instructions at home: Medicines  Take over-the-counter and prescription medicines only as told by your health care provider.  Apply an anti-itch cream or numbing cream to the affected area as told by your health care provider.  Relieving itching and discomfort  Apply cold, wet cloths (cold compresses) to the area of the rash or blisters as told by your health care provider.  Cool baths can be soothing. Try adding baking soda or dry oatmeal to the water to reduce itching. Do not bathe in hot water.   Blister and rash care  Keep your rash covered with a loose bandage (dressing). Wear loose-fitting clothing to help  ease the pain of material rubbing against the rash.  Keep your rash and blisters clean by washing the area with mild soap and cool water as told by your health care provider.  Check your rash every day for signs of infection. Check for: ? More redness, swelling, or pain. ? Fluid or blood. ? Warmth. ? Pus or a bad smell.  Do not scratch your rash or pick at your blisters. To help avoid scratching: ? Keep your fingernails clean and cut short. ? Wear gloves or mittens while you sleep, if scratching is a problem. General instructions  Rest as told by your health care provider.  Keep all follow-up visits as told by your health care provider. This is important.  Wash your hands often with soap and water. If soap and water are not available, use hand sanitizer. Doing this lowers your chance of getting a bacterial skin infection.  Before your blisters change into scabs, your shingles infection can cause chickenpox in people who have never had it or have never been vaccinated against it. To prevent this from happening, avoid contact with other people, especially: ? Babies. ? Pregnant women. ? Children who have eczema. ? Elderly people who have transplants. ? People who have chronic illnesses, such as cancer or AIDS. Contact a health care provider if:  Your pain is not relieved with prescribed medicines.  Your pain does not get better after the rash heals.  You have signs of infection in the rash area, such as: ? More redness, swelling, or pain around the rash. ? Fluid or blood coming from the rash. ? The rash area feeling warm to the touch. ? Pus or a bad smell coming from the rash. Get help right away if:  The rash is on your face or nose.  You have facial pain, pain around your eye area, or loss of feeling on one side of your face.  You have difficulty seeing.  You have ear pain or have ringing in your ear.  You have a loss of taste.  Your condition gets  worse. Summary  Shingles, which is also known as herpes zoster, is an infection that causes a painful skin rash and fluid-filled blisters.  This condition is diagnosed with a skin exam. Skin or fluid samples may be taken from the blisters and examined before the diagnosis is made.  Keep your rash covered with a loose bandage (dressing). Wear loose-fitting clothing to help ease the pain of material rubbing against the rash.  Before your blisters change into scabs, your shingles infection can cause chickenpox in people who have never had it or have never been vaccinated against it. This information is not intended to replace advice given to you by your health care provider. Make sure you discuss any questions you have with your health care provider. Document Revised: 04/18/2018 Document Reviewed: 08/29/2016 Elsevier Patient Education  2021 [employer].

## 2020-01-19 NOTE — Progress Notes (Signed)
HPI: Kristen Nichols is a 65 y.o. female who  has a past medical history of Allergy, Anxiety, Benign essential hypertension (10/13/2012), Colitis, Diverticulosis, GERD (gastroesophageal reflux disease), Headache(784.0), History of colon polyps, Hypertension, IBS (irritable bowel syndrome), PONV (postoperative nausea and vomiting), and UC (ulcerative colitis) ([place]).  she presents to [facility] today, 01/19/20,  for chief complaint of:  Right breast pain  . Context: no injuries or hx of similar sx  . Location: R breast radiating to nipple . Quality: sharp/burning  . Duration: 4 days . Timing: constant  . Modifying factors: o Worse: raising/moving R arm  . Assoc signs/symptoms: R breast feels swollen      Past medical, surgical, social and family history reviewed:  Patient Active Problem List   Diagnosis Date Noted  . History of fundoplication   . Hiatal hernia with GERD and esophagitis 12/11/2018  . Gastroesophageal reflux disease with esophagitis   . Hiatal hernia   . Gastroesophageal reflux disease with esophagitis without hemorrhage   . Colitis 04/09/2016  . Insomnia 02/23/2016  . Dyslipidemia 12/29/2014  . Carpal tunnel syndrome 12/29/2014  . Allergic rhinitis 10/13/2012  . Benign essential hypertension 10/13/2012  . Depression, major, single episode, complete remission ([place]) 05/07/2010    Past Surgical History:  Procedure Laterality Date  . ANKLE SURGERY Left    pin in place   . BREAST BIOPSY Right    marker in place   . BREAST CYST ASPIRATION    . CESAREAN SECTION     x2  . COLONOSCOPY     has had 2 or 3 of them per patient. Last one was around 2016 with [facility] or maybe [facility] , last february 2020 now toal of 4   . ESOPHAGOGASTRODUODENOSCOPY (EGD) WITH PROPOFOL N/A 12/11/2018   Procedure: ESOPHAGOGASTRODUODENOSCOPY (EGD) WITH PROPOFOL;  Surgeon: Lavena Bullion, DO;  Location: [facility];  Service: Gastroenterology;   Laterality: N/A;  . MALONEY DILATION  12/11/2018   Procedure: MALONEY DILATION;  Surgeon: Lavena Bullion, DO;  Location: [facility];  Service: Gastroenterology;;  . NASAL SINUS SURGERY    . TRANSORAL INCISIONLESS FUNDOPLICATION N/A 17/04/812   Procedure: TRANSORAL INCISIONLESS FUNDOPLICATION;  Surgeon: Lavena Bullion, DO;  Location: [facility];  Service: Gastroenterology;  Laterality: N/A;    Social History   Tobacco Use  . Smoking status: Former Smoker    Packs/day: 0.50    Years: 20.00    Pack years: 10.00    Types: Cigarettes    Quit date: 01/09/2016    Years since quitting: 4.0  . Smokeless tobacco: Never Used  Substance Use Topics  . Alcohol use: Yes    Alcohol/week: 2.0 standard drinks    Types: 2 Cans of beer per week    Comment: 3-4 alcoholic drinks weekly     Family History  Problem Relation Age of Onset  . Other Mother   . Other Father   . Cancer Father        small cell lung cancer-limited   . Diabetes Maternal Grandmother   . Colon cancer Neg Hx   . Esophageal cancer Neg Hx      Current medication list and allergy/intolerance information reviewed:    Current Outpatient Medications  Medication Sig Dispense Refill  . buPROPion (WELLBUTRIN XL) 150 MG 24 hr tablet Take 1 tablet (150 mg total) by mouth daily. 90 tablet 3  . diclofenac sodium (VOLTAREN) 1 % GEL Apply 4 g topically 4 (four) times daily.  To affected joint. (Patient taking differently: Apply 4 g topically 4 (four) times daily as needed (pain). To affected joint.) 100 g 11  . fexofenadine (ALLEGRA) 180 MG tablet Take 180 mg by mouth daily as needed for allergies or rhinitis.    Marland Kitchen FIBER PO Take by mouth daily. 1 Tablespoon daily    . FLUoxetine (PROZAC) 20 MG tablet TAKE 1 TABLET (20 MG TOTAL) BY MOUTH DAILY. 90 tablet 3  . fluticasone (FLONASE) 50 MCG/ACT nasal spray SPRAY 2 SPRAYS INTO EACH NOSTRIL EVERY DAY (Patient taking differently: Place 2 sprays into both nostrils daily as needed for  allergies.) 16 g 0  . hydrochlorothiazide (HYDRODIURIL) 25 MG tablet TAKE 1 TABLET BY MOUTH EVERY DAY 90 tablet 1  . ibuprofen (ADVIL,MOTRIN) 200 MG tablet Take 600-800 mg by mouth every 6 (six) hours as needed for moderate pain.    Marland Kitchen lidocaine-prilocaine (EMLA) cream Apply 1 application topically as needed. 30 g 1  . Propylene Glycol (SYSTANE BALANCE) 0.6 % SOLN Place 1 drop into both eyes 2 (two) times daily as needed (dry eyes).    . simethicone (MYLICON) 80 MG chewable tablet Chew 1 tablet (80 mg total) by mouth every 6 (six) hours as needed for flatulence (gas, bloating, abdominal discomfort). 30 tablet 0  . valACYclovir (VALTREX) 1000 MG tablet Take 1 tablet (1,000 mg total) by mouth 3 (three) times daily for 7 days. 21 tablet 0   No current facility-administered medications for this visit.    No Known Allergies    Review of Systems:  Constitutional:  No  fever, no chills  HEENT: No  headache, no vision change  Respiratory:  No  shortness of breath.   Musculoskeletal: No new myalgia/arthralgia  Skin: No  Rash, No other wounds/concerning lesions  Exam:  BP 134/85 (BP Location: Left Arm, Patient Position: Sitting, Cuff Size: Normal)   Pulse 95   Temp 97.8 F (36.6 C) (Oral)   Wt 133 lb 1.9 oz (60.4 kg)   BMI 24.35 kg/m   Constitutional: VS see above. General Appearance: alert, well-developed, well-nourished, NAD  Musculoskeletal: no pain w/ resisted R shoulder abduction, forward flexion, any activation of pectoral muscles    Neurological: Normal balance/coordination. No tremor  Skin: warm, dry, intact. No rash/ulcer. No concerning nevi or subq nodules on limited exam.    Psychiatric: Normal judgment/insight. Normal mood and affect. Oriented x3.   BREAST: No rashes/skin changes, normal fibrous breast tissue, no masses or tenderness, normal nipple without discharge, normal axilla. (+)pain w/ palpation at 8:00-9:00 position adjacent to nipple and extending out into  lateral breast tissue, no axillary pain or tendernes son back    No results found for this or any previous visit (from the past 8 hour(s)).  No results found.   ASSESSMENT/PLAN: The primary encounter diagnosis was Herpes zoster without complication. Diagnoses of Annual physical exam, Benign essential hypertension, Dyslipidemia, and Breast pain, right were also pertinent to this visit.  Suspect shingles w/o rash given character of pain and superficial nature, unilateral, dermatomal. Pt advised rash may develop. Not vaccinated for Shingles. If antivirals and pain Rx not helpful within the next few days or if worse/change let me know!    Orders Placed This Encounter  Procedures  . MM Digital Diagnostic Bilat  . US BREAST COMPLETE UNI RIGHT INC AXILLA  . CBC  . COMPLETE METABOLIC PANEL WITH GFR  . Lipid panel    Meds ordered this encounter  Medications  . valACYclovir (VALTREX) 1000 MG  tablet    Sig: Take 1 tablet (1,000 mg total) by mouth 3 (three) times daily for 7 days.    Dispense:  21 tablet    Refill:  0  . lidocaine-prilocaine (EMLA) cream    Sig: Apply 1 application topically as needed.    Dispense:  30 g    Refill:  1    Patient Instructions   I think this might be Shingles! See below for more info on this condition Medications: antivirals (Valtrex aka valacyclovir) and numbing cream (Emla)  Will see if we can get the mammogram +/- ultrasound moved up just in case this isn't shingles (not responding to Rx)  Let me know if anything changes or gets worse!      Orders are in for annual lab work! Please get fasting blood drawn at least 2 days before your appointment. (No food or anything to drink besides water or black coffee, 8 hours prior to blood draw. Take all medications as usual). Lab is in room 135 in the [facility] building. Open 7-5 weekdays, no appointment needed.        Shingles  Shingles, which is also known as herpes zoster, is an infection that  causes a painful skin rash and fluid-filled blisters. It is caused by a virus. Shingles only develops in people who:  Have had chickenpox.  Have been given a medicine to protect against chickenpox (have been vaccinated). Shingles is rare in this group. What are the causes? Shingles is caused by varicella-zoster virus (VZV). This is the same virus that causes chickenpox. After a person is exposed to VZV, the virus stays in the body in an inactive (dormant) state. Shingles develops if the virus is reactivated. This can happen many years after the first (initial) exposure to VZV. It is not known what causes this virus to be reactivated. What increases the risk? People who have had chickenpox or received the chickenpox vaccine are at risk for shingles. Shingles infection is more common in people who:  Are older than age 52.  Have a weakened disease-fighting system (immune system), such as people with: ? HIV. ? AIDS. ? Cancer.  Are taking medicines that weaken the immune system, such as transplant medicines.  Are experiencing a lot of stress. What are the signs or symptoms? Early symptoms of this condition include itching, tingling, and pain in an area on your skin. Pain may be described as burning, stabbing, or throbbing. A few days or weeks after early symptoms start, a painful red rash appears. The rash is usually on one side of the body and has a band-like or belt-like pattern. The rash eventually turns into fluid-filled blisters that break open, change into scabs, and dry up in about 2-3 weeks. At any time during the infection, you may also develop:  A fever.  Chills.  A headache.  An upset stomach. How is this diagnosed? This condition is diagnosed with a skin exam. Skin or fluid samples may be taken from the blisters before a diagnosis is made. These samples are examined under a microscope or sent to a lab for testing. How is this treated? The rash may last for several weeks.  There is not a specific cure for this condition. Your health care provider will probably prescribe medicines to help you manage pain, recover more quickly, and avoid long-term problems. Medicines may include:  Antiviral drugs.  Anti-inflammatory drugs.  Pain medicines.  Anti-itching medicines (antihistamines). If the area involved is on your face, you may  be referred to a specialist, such as an eye doctor (ophthalmologist) or an ear, nose, and throat (ENT) doctor (otolaryngologist) to help you avoid eye problems, chronic pain, or disability. Follow these instructions at home: Medicines  Take over-the-counter and prescription medicines only as told by your health care provider.  Apply an anti-itch cream or numbing cream to the affected area as told by your health care provider. Relieving itching and discomfort  Apply cold, wet cloths (cold compresses) to the area of the rash or blisters as told by your health care provider.  Cool baths can be soothing. Try adding baking soda or dry oatmeal to the water to reduce itching. Do not bathe in hot water.   Blister and rash care  Keep your rash covered with a loose bandage (dressing). Wear loose-fitting clothing to help ease the pain of material rubbing against the rash.  Keep your rash and blisters clean by washing the area with mild soap and cool water as told by your health care provider.  Check your rash every day for signs of infection. Check for: ? More redness, swelling, or pain. ? Fluid or blood. ? Warmth. ? Pus or a bad smell.  Do not scratch your rash or pick at your blisters. To help avoid scratching: ? Keep your fingernails clean and cut short. ? Wear gloves or mittens while you sleep, if scratching is a problem. General instructions  Rest as told by your health care provider.  Keep all follow-up visits as told by your health care provider. This is important.  Wash your hands often with soap and water. If soap and water  are not available, use hand sanitizer. Doing this lowers your chance of getting a bacterial skin infection.  Before your blisters change into scabs, your shingles infection can cause chickenpox in people who have never had it or have never been vaccinated against it. To prevent this from happening, avoid contact with other people, especially: ? Babies. ? Pregnant women. ? Children who have eczema. ? Elderly people who have transplants. ? People who have chronic illnesses, such as cancer or AIDS. Contact a health care provider if:  Your pain is not relieved with prescribed medicines.  Your pain does not get better after the rash heals.  You have signs of infection in the rash area, such as: ? More redness, swelling, or pain around the rash. ? Fluid or blood coming from the rash. ? The rash area feeling warm to the touch. ? Pus or a bad smell coming from the rash. Get help right away if:  The rash is on your face or nose.  You have facial pain, pain around your eye area, or loss of feeling on one side of your face.  You have difficulty seeing.  You have ear pain or have ringing in your ear.  You have a loss of taste.  Your condition gets worse. Summary  Shingles, which is also known as herpes zoster, is an infection that causes a painful skin rash and fluid-filled blisters.  This condition is diagnosed with a skin exam. Skin or fluid samples may be taken from the blisters and examined before the diagnosis is made.  Keep your rash covered with a loose bandage (dressing). Wear loose-fitting clothing to help ease the pain of material rubbing against the rash.  Before your blisters change into scabs, your shingles infection can cause chickenpox in people who have never had it or have never been vaccinated against it. This information is  not intended to replace advice given to you by your health care provider. Make sure you discuss any questions you have with your health care  provider. Document Revised: 04/18/2018 Document Reviewed: 08/29/2016 Elsevier Patient Education  [employer].        Visit summary with medication list and pertinent instructions was printed for patient to review. All questions at time of visit were answered - patient instructed to contact office with any additional concerns or updates. ER/RTC precautions were reviewed with the patient.   Please note: voice recognition software was used to produce this document, and typos may escape review. Please contact Dr. Sheppard Coil for any needed clarifications.     Follow-up plan: Return if symptoms worsen or fail to improve, for [place].

## 2020-01-23 ENCOUNTER — Other Ambulatory Visit: Payer: Self-pay | Admitting: Osteopathic Medicine

## 2020-01-28 ENCOUNTER — Encounter: Payer: Self-pay | Admitting: Osteopathic Medicine

## 2020-01-29 NOTE — Telephone Encounter (Signed)
Patient is having unusual breast pain symptoms.  I treated her for shingles, hoping it was this without development of rash, but she has not gotten better.  She has an appointment with the breast center for mammogram/ultrasound in March, can we refer her to another location through [facility] that might be able to get her in sooner for a diagnostic mammogram/ultrasound?  Thanks!

## 2020-02-04 DIAGNOSIS — Z1231 Encounter for screening mammogram for malignant neoplasm of breast: Secondary | ICD-10-CM

## 2020-02-09 ENCOUNTER — Encounter: Payer: Federal, State, Local not specified - PPO | Admitting: Osteopathic Medicine

## 2020-02-26 DIAGNOSIS — E785 Hyperlipidemia, unspecified: Secondary | ICD-10-CM | POA: Diagnosis not present

## 2020-02-26 DIAGNOSIS — Z0000 Encounter for general adult medical examination without abnormal findings: Secondary | ICD-10-CM | POA: Diagnosis not present

## 2020-02-26 DIAGNOSIS — I10 Essential (primary) hypertension: Secondary | ICD-10-CM | POA: Diagnosis not present

## 2020-02-27 LAB — CBC
HCT: 43.3 % (ref 35.0–45.0)
Hemoglobin: 15 g/dL (ref 11.7–15.5)
MCH: 32.1 pg (ref 27.0–33.0)
MCHC: 34.6 g/dL (ref 32.0–36.0)
MCV: 92.5 fL (ref 80.0–100.0)
MPV: 9.7 fL (ref 7.5–12.5)
Platelets: 325 10*3/uL (ref 140–400)
RBC: 4.68 10*6/uL (ref 3.80–5.10)
RDW: 12 % (ref 11.0–15.0)
WBC: 7.1 10*3/uL (ref 3.8–10.8)

## 2020-02-27 LAB — COMPLETE METABOLIC PANEL WITH GFR
AG Ratio: 2 (calc) (ref 1.0–2.5)
ALT: 23 U/L (ref 6–29)
AST: 19 U/L (ref 10–35)
Albumin: 4.5 g/dL (ref 3.6–5.1)
Alkaline phosphatase (APISO): 79 U/L (ref 37–153)
BUN: 16 mg/dL (ref 7–25)
CO2: 31 mmol/L (ref 20–32)
Calcium: 9.5 mg/dL (ref 8.6–10.4)
Chloride: 101 mmol/L (ref 98–110)
Creat: 0.8 mg/dL (ref 0.50–0.99)
GFR, Est African American: 92 mL/min/{1.73_m2} (ref >=60)
GFR, Est Non African American: 79 mL/min/{1.73_m2} (ref >=60)
Globulin: 2.2 g/dL (calc) (ref 1.9–3.7)
Glucose, Bld: 90 mg/dL (ref 65–99)
Potassium: 4.2 mmol/L (ref 3.5–5.3)
Sodium: 141 mmol/L (ref 135–146)
Total Bilirubin: 0.5 mg/dL (ref 0.2–1.2)
Total Protein: 6.7 g/dL (ref 6.1–8.1)

## 2020-02-27 LAB — LIPID PANEL
Cholesterol: 202 mg/dL — ABNORMAL HIGH (ref <200)
HDL: 72 mg/dL (ref >=50)
LDL Cholesterol (Calc): 113 mg/dL (calc) — ABNORMAL HIGH
Non-HDL Cholesterol (Calc): 130 mg/dL (calc) — ABNORMAL HIGH (ref <130)
Total CHOL/HDL Ratio: 2.8 (calc) (ref <5.0)
Triglycerides: 76 mg/dL (ref <150)

## 2020-03-01 ENCOUNTER — Ambulatory Visit (INDEPENDENT_AMBULATORY_CARE_PROVIDER_SITE_OTHER): Payer: Federal, State, Local not specified - PPO | Admitting: Osteopathic Medicine

## 2020-03-01 ENCOUNTER — Encounter: Payer: Self-pay | Admitting: Osteopathic Medicine

## 2020-03-01 VITALS — BP 115/73 | HR 114 | Temp 98.1°F | Wt 132.1 lb

## 2020-03-01 DIAGNOSIS — Z23 Encounter for immunization: Secondary | ICD-10-CM

## 2020-03-01 DIAGNOSIS — Z0000 Encounter for general adult medical examination without abnormal findings: Secondary | ICD-10-CM

## 2020-03-01 MED ORDER — FLUTICASONE PROPIONATE 50 MCG/ACT NA SUSP: NASAL | 4 refills | Status: DC

## 2020-03-01 MED ORDER — HYDROCHLOROTHIAZIDE 25 MG PO TABS: 25.0000 mg | ORAL_TABLET | Freq: Every day | ORAL | 3 refills | Status: DC

## 2020-03-01 NOTE — Patient Instructions (Addendum)
General Preventive Care  Most recent routine screening labs: see attached.   Blood pressure goal 130/80 or less.   Tobacco: don't!   Alcohol: responsible moderation is ok for most adults - if you have concerns about your alcohol intake, please talk to me!   Exercise: as tolerated to reduce risk of cardiovascular disease and diabetes. Strength training will also prevent osteoporosis.   Mental health: if need for mental health care (adust medicines, refer to counseling, other), please let me know!   Sexual / Reproductive health: if need for STD testing, or if concerns with libido/pain problems, please let me know!  Advanced Directive: Living Will and/or Healthcare Power of Attorney recommended for all adults, regardless of age or health.  Vaccines  Flu vaccine: for almost everyone, every fall.   Shingles vaccine: after age 63.   Pneumonia vaccines: after age 62.  Tetanus booster: every 10 years - due 2030  COVID vaccine: THANKS for getting your vaccine! :)  Cancer screenings   Colon cancer screening: for everyone age 79-75. Colonoscopy per GI (10 years from last)   Breast cancer screening: mammogram annually after age 37.   Cervical cancer screening: Pap due 01/2023. Can stop at age 38 if Paps normal.   Lung cancer screening: not needed (not heavy smoker based on average packs/years smoked)  Infection screenings  . HIV: recommended screening at least once age 63-65. Marland Kitchen Gonorrhea/Chlamydia: screening as needed . Hepatitis C: recommended once for everyone age 71-75 . TB: certain at-risk populations, or depending on work requirements and/or travel history Other . Bone Density Test: recommended for women at age 78

## 2020-03-01 NOTE — Progress Notes (Signed)
Kristen Nichols is a 65 y.o. female who presents to  [place] at [facility]  today, 03/01/20, seeking care for the following:  . Annual physical      ASSESSMENT & PLAN with other pertinent findings:  The primary encounter diagnosis was Annual physical exam. Diagnoses of Need for shingles vaccine and Need for influenza vaccination were also pertinent to this visit.    Patient Instructions  General Preventive Care  Most recent routine screening labs: see attached.   Blood pressure goal 130/80 or less.   Tobacco: don't!   Alcohol: responsible moderation is ok for most adults - if you have concerns about your alcohol intake, please talk to me!   Exercise: as tolerated to reduce risk of cardiovascular disease and diabetes. Strength training will also prevent osteoporosis.   Mental health: if need for mental health care (adust medicines, refer to counseling, other), please let me know!   Sexual / Reproductive health: if need for STD testing, or if concerns with libido/pain problems, please let me know!  Advanced Directive: Living Will and/or Healthcare Power of Attorney recommended for all adults, regardless of age or health.  Vaccines  Flu vaccine: for almost everyone, every fall.   Shingles vaccine: after age 4.   Pneumonia vaccines: after age 75.  Tetanus booster: every 10 years - due 2030  COVID vaccine: THANKS for getting your vaccine! :)  Cancer screenings   Colon cancer screening: for everyone age 3-75. Colonoscopy per GI (10 years from last)   Breast cancer screening: mammogram annually after age 59.   Cervical cancer screening: Pap due 01/2023. Can stop at age 54 if Paps normal.   Lung cancer screening: not needed (not heavy smoker based on average packs/years smoked)  Infection screenings  . HIV: recommended screening at least once age 72-65. Marland Kitchen Gonorrhea/Chlamydia: screening as needed . Hepatitis C:  recommended once for everyone age 78-75 . TB: certain at-risk populations, or depending on work requirements and/or travel history Other . Bone Density Test: recommended for women at age 71   Orders Placed This Encounter  Procedures  . Flu Vaccine QUAD 6+ mos PF IM (Fluarix Quad PF)  . Varicella-zoster vaccine IM (Shingrix)    Meds ordered this encounter  Medications  . hydrochlorothiazide (HYDRODIURIL) 25 MG tablet    Sig: Take 1 tablet (25 mg total) by mouth daily.    Dispense:  90 tablet    Refill:  3     See below for relevant physical exam findings  See below for recent lab and imaging results reviewed  Medications, allergies, PMH, PSH, SocH, FamH reviewed below    Follow-up instructions: Return in about 1 year (around 03/01/2021) for ANNUAL CHECK-UP - SEE [place] SOONER IF NEEDED.                                        Exam:  BP 115/73 (BP Location: Left Arm, Patient Position: Sitting, Cuff Size: Normal)   Pulse (!) 114   Temp 98.1 F (36.7 C) (Oral)   Wt 132 lb 1.9 oz (59.9 kg)   BMI 24.17 kg/m   Constitutional: VS see above. General Appearance: alert, well-developed, well-nourished, NAD  Neck: No masses, trachea midline.   Respiratory: Normal respiratory effort. no wheeze, no rhonchi, no rales  Cardiovascular: S1/S2 normal, no murmur, no rub/gallop auscultated. RRR.   Musculoskeletal: Gait  normal. Symmetric and independent movement of all extremities  Abdominal: non-tender, non-distended, no appreciable organomegaly, neg Murphy's, BS WNLx4  Neurological: Normal balance/coordination. No tremor.  Skin: warm, dry, intact.   Psychiatric: Normal judgment/insight. Normal mood and affect. Oriented x3.   Current Meds  Medication Sig  . buPROPion (WELLBUTRIN XL) 150 MG 24 hr tablet Take 1 tablet (150 mg total) by mouth daily.  . diclofenac sodium (VOLTAREN) 1 % GEL Apply 4 g topically 4 (four) times daily. To affected joint.  (Patient taking differently: Apply 4 g topically 4 (four) times daily as needed (pain). To affected joint.)  . fexofenadine (ALLEGRA) 180 MG tablet Take 180 mg by mouth daily as needed for allergies or rhinitis.  Marland Kitchen FIBER PO Take by mouth daily. 1 Tablespoon daily  . FLUoxetine (PROZAC) 20 MG tablet TAKE 1 TABLET (20 MG TOTAL) BY MOUTH DAILY.  . fluticasone (FLONASE) 50 MCG/ACT nasal spray SPRAY 2 SPRAYS INTO EACH NOSTRIL EVERY DAY (Patient taking differently: Place 2 sprays into both nostrils daily as needed for allergies.)  . ibuprofen (ADVIL,MOTRIN) 200 MG tablet Take 600-800 mg by mouth every 6 (six) hours as needed for moderate pain.  Marland Kitchen lidocaine-prilocaine (EMLA) cream Apply 1 application topically as needed.  Marland Kitchen Propylene Glycol (SYSTANE BALANCE) 0.6 % SOLN Place 1 drop into both eyes 2 (two) times daily as needed (dry eyes).  . simethicone (MYLICON) 80 MG chewable tablet Chew 1 tablet (80 mg total) by mouth every 6 (six) hours as needed for flatulence (gas, bloating, abdominal discomfort).  . [DISCONTINUED] hydrochlorothiazide (HYDRODIURIL) 25 MG tablet TAKE 1 TABLET BY MOUTH EVERY DAY    No Known Allergies  Patient Active Problem List   Diagnosis Date Noted  . History of fundoplication   . Hiatal hernia with GERD and esophagitis 12/11/2018  . Gastroesophageal reflux disease with esophagitis   . Hiatal hernia   . Gastroesophageal reflux disease with esophagitis without hemorrhage   . Colitis 04/09/2016  . Insomnia 02/23/2016  . Dyslipidemia 12/29/2014  . Carpal tunnel syndrome 12/29/2014  . Allergic rhinitis 10/13/2012  . Benign essential hypertension 10/13/2012  . Depression, major, single episode, complete remission ([place]) 05/07/2010    Family History  Problem Relation Age of Onset  . Other Mother   . Other Father   . Cancer Father        small cell lung cancer-limited   . Diabetes Maternal Grandmother   . Colon cancer Neg Hx   . Esophageal cancer Neg Hx     Social  History   Tobacco Use  Smoking Status Former Smoker  . Packs/day: 0.50  . Years: 20.00  . Pack years: 10.00  . Types: Cigarettes  . Quit date: 01/09/2016  . Years since quitting: 4.1  Smokeless Tobacco Never Used    Past Surgical History:  Procedure Laterality Date  . ANKLE SURGERY Left    pin in place   . BREAST BIOPSY Right    marker in place   . BREAST CYST ASPIRATION    . CESAREAN SECTION     x2  . COLONOSCOPY     has had 2 or 3 of them per patient. Last one was around 2016 with [facility] or maybe [facility] , last february 2020 now toal of 4   . ESOPHAGOGASTRODUODENOSCOPY (EGD) WITH PROPOFOL N/A 12/11/2018   Procedure: ESOPHAGOGASTRODUODENOSCOPY (EGD) WITH PROPOFOL;  Surgeon: Lavena Bullion, DO;  Location: [facility];  Service: Gastroenterology;  Laterality: N/A;  . MALONEY DILATION  12/11/2018  Procedure: MALONEY DILATION;  Surgeon: Lavena Bullion, DO;  Location: [facility];  Service: Gastroenterology;;  . NASAL SINUS SURGERY    . TRANSORAL INCISIONLESS FUNDOPLICATION N/A 24/05/8097   Procedure: TRANSORAL INCISIONLESS FUNDOPLICATION;  Surgeon: Lavena Bullion, DO;  Location: [facility];  Service: Gastroenterology;  Laterality: N/A;    Immunization History  Administered Date(s) Administered  . Influenza,inj,Quad PF,6+ Mos 02/23/2016, 02/05/2018, 09/11/2018, 03/01/2020  . Moderna Sars-Covid-2 Vaccination 03/27/2019, 04/28/2019, 12/07/2019  . Td 01/09/2000  . Tdap 02/05/2018  . Zoster Recombinat (Shingrix) 03/01/2020    Recent Results (from the past 2160 hour(s))  CBC     Status: None   Collection Time: 02/26/20  7:16 AM  Result Value Ref Range   WBC 7.1 3.8 - 10.8 Thousand/uL   RBC 4.68 3.80 - 5.10 Million/uL   Hemoglobin 15.0 11.7 - 15.5 g/dL   HCT 43.3 35.0 - 45.0 %   MCV 92.5 80.0 - 100.0 fL   MCH 32.1 27.0 - 33.0 pg   MCHC 34.6 32.0 - 36.0 g/dL   RDW 12.0 11.0 - 15.0 %   Platelets 325 140 - 400 Thousand/uL   MPV 9.7 7.5 - 12.5 fL  COMPLETE  METABOLIC PANEL WITH GFR     Status: None   Collection Time: 02/26/20  7:16 AM  Result Value Ref Range   Glucose, Bld 90 65 - 99 mg/dL    Comment: .            Fasting reference interval .    BUN 16 7 - 25 mg/dL   Creat 0.80 0.50 - 0.99 mg/dL    Comment: For patients >61 years of age, the reference limit for Creatinine is approximately 13% higher for people identified as African-American. .    GFR, Est Non African American 79 > OR = 60 mL/min/1.80m   GFR, Est African American 92 > OR = 60 mL/min/1.765m  BUN/Creatinine Ratio NOT APPLICABLE 6 - 22 (calc)   Sodium 141 135 - 146 mmol/L   Potassium 4.2 3.5 - 5.3 mmol/L   Chloride 101 98 - 110 mmol/L   CO2 31 20 - 32 mmol/L   Calcium 9.5 8.6 - 10.4 mg/dL   Total Protein 6.7 6.1 - 8.1 g/dL   Albumin 4.5 3.6 - 5.1 g/dL   Globulin 2.2 1.9 - 3.7 g/dL (calc)   AG Ratio 2.0 1.0 - 2.5 (calc)   Total Bilirubin 0.5 0.2 - 1.2 mg/dL   Alkaline phosphatase (APISO) 79 37 - 153 U/L   AST 19 10 - 35 U/L   ALT 23 6 - 29 U/L  Lipid panel     Status: Abnormal   Collection Time: 02/26/20  7:16 AM  Result Value Ref Range   Cholesterol 202 (H) <200 mg/dL   HDL 72 > OR = 50 mg/dL   Triglycerides 76 <150 mg/dL   LDL Cholesterol (Calc) 113 (H) mg/dL (calc)    Comment: Reference range: <100 . Desirable range <100 mg/dL for primary prevention;   <70 mg/dL for patients with CHD or diabetic patients  with > or = 2 CHD risk factors. . Marland KitchenDL-C is now calculated using the Martin-Hopkins  calculation, which is a validated novel method providing  better accuracy than the Friedewald equation in the  estimation of LDL-C.  MaCresenciano Genret al. JAAnnamaria [id number];[phone]  (http://education.[employer].com/faq/FAQ164)    Total CHOL/HDL Ratio 2.8 <5.0 (calc)   Non-HDL Cholesterol (Calc) 130 (H) <130 mg/dL (calc)    Comment: For patients with  diabetes plus 1 major ASCVD risk  factor, treating to a non-HDL-C goal of <100 mg/dL  (LDL-C of <70 mg/dL) is  considered a therapeutic  option.     No results found.     All questions at time of visit were answered - patient instructed to contact office with any additional concerns or updates. ER/RTC precautions were reviewed with the patient as applicable.   Please note: manual typing as well as voice recognition software may have been used to produce this document - typos may escape review. Please contact Dr. Sheppard Coil for any needed clarifications.

## 2020-03-09 ENCOUNTER — Ambulatory Visit
Admission: RE | Admit: 2020-03-09 | Discharge: 2020-03-09 | Disposition: A | Discharge status: Home / Self Care | Payer: Federal, State, Local not specified - PPO | Source: Ambulatory Visit | Attending: Osteopathic Medicine | Admitting: Osteopathic Medicine

## 2020-03-09 ENCOUNTER — Other Ambulatory Visit: Payer: Self-pay

## 2020-03-09 ENCOUNTER — Ambulatory Visit: Payer: Federal, State, Local not specified - PPO

## 2020-03-09 DIAGNOSIS — N644 Mastodynia: Secondary | ICD-10-CM | POA: Diagnosis not present

## 2020-03-28 IMAGING — DX DG ANKLE COMPLETE 3+V*R*
3 series · 3 of 3 positions shown · non-contrast
Comparison: 08/26/2018

CLINICAL DATA: RIGHT ankle pain, follow-up, not improving

EXAM:
RIGHT ANKLE - COMPLETE 3+ VIEW

[ankle ap]
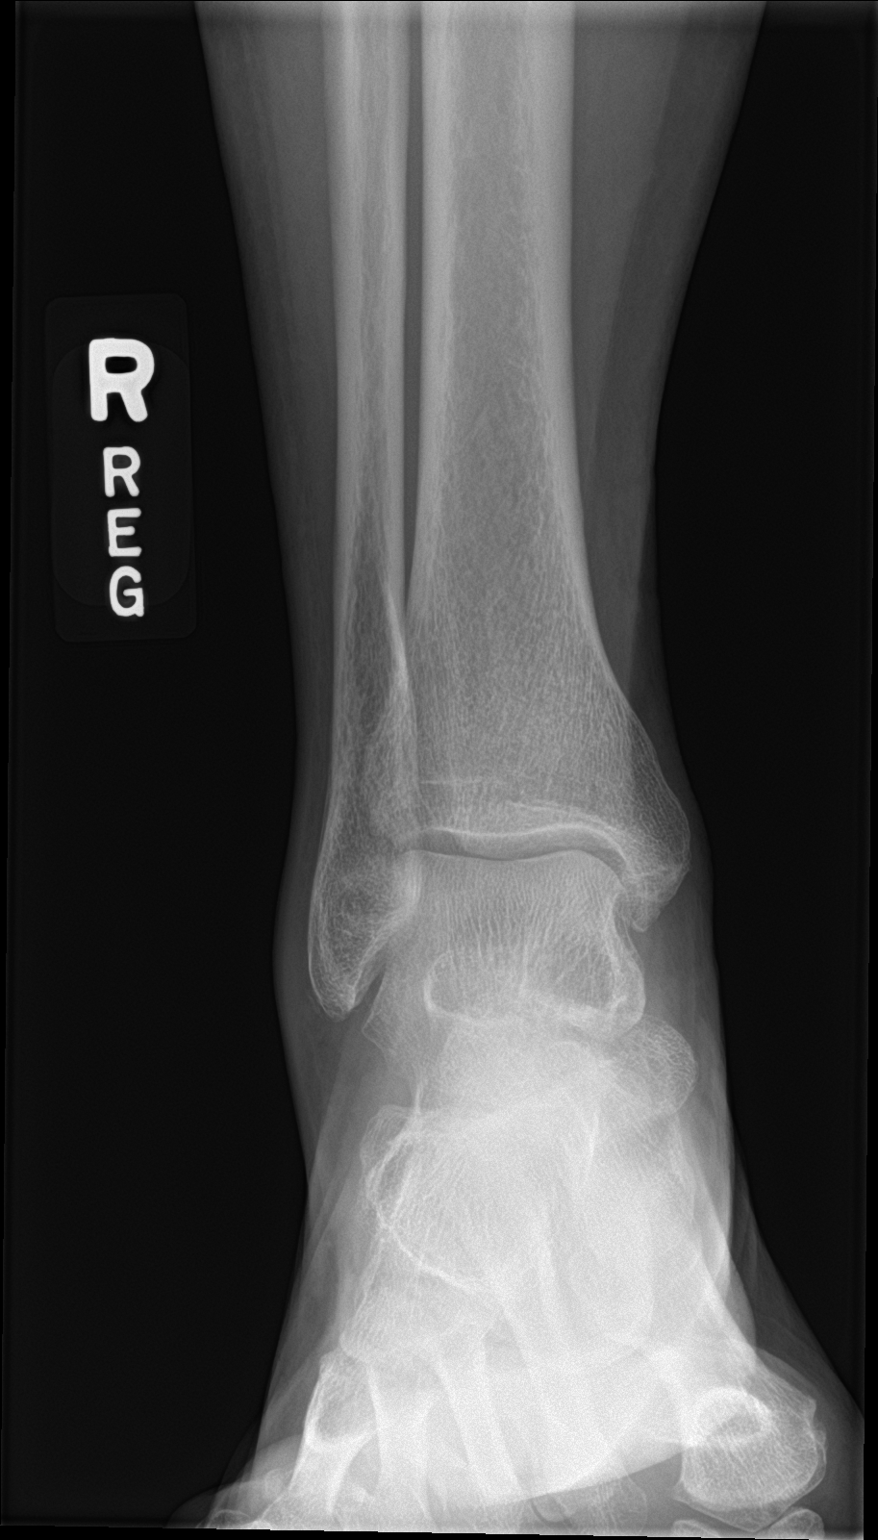

[ankle obl]
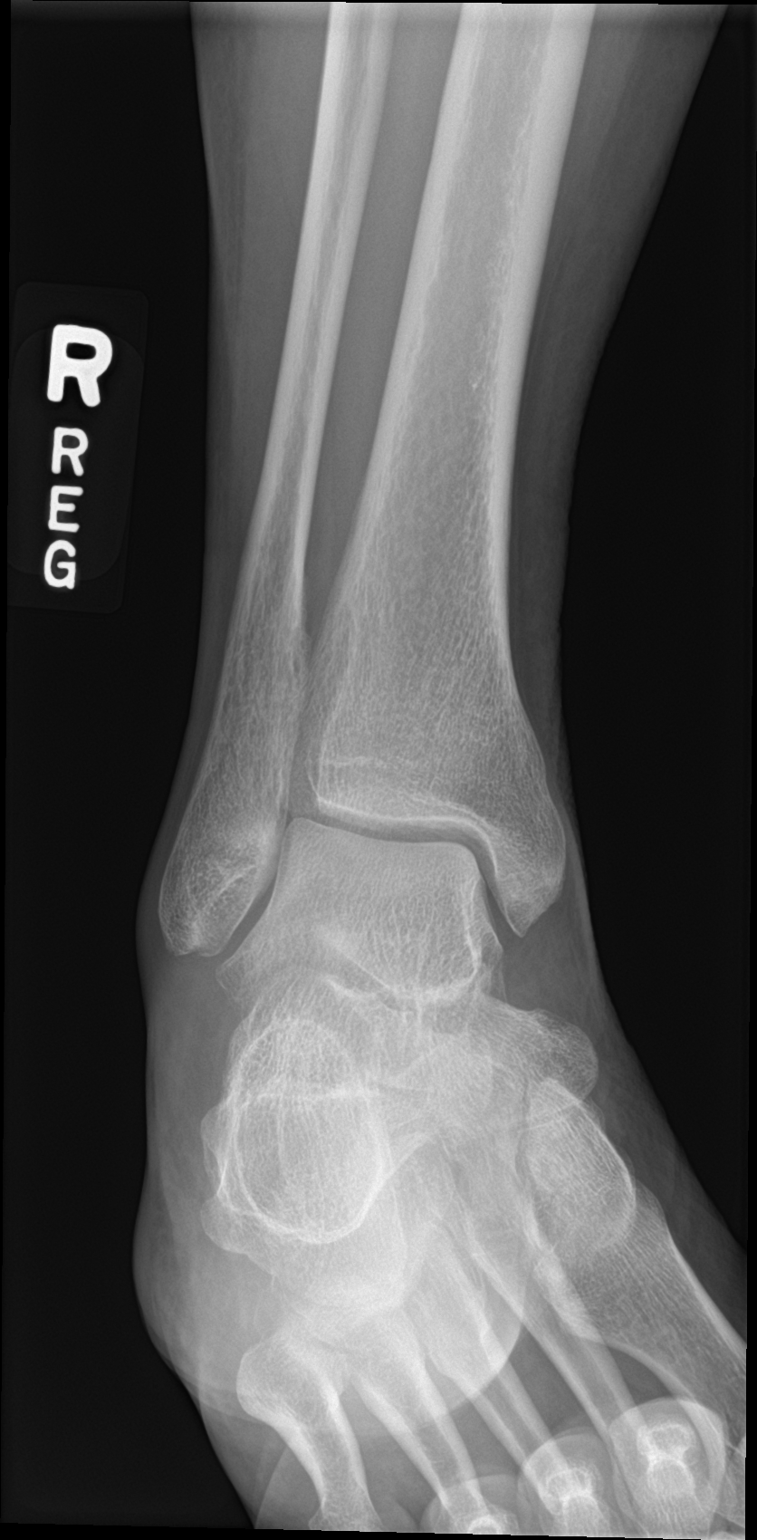

[ankle lat]
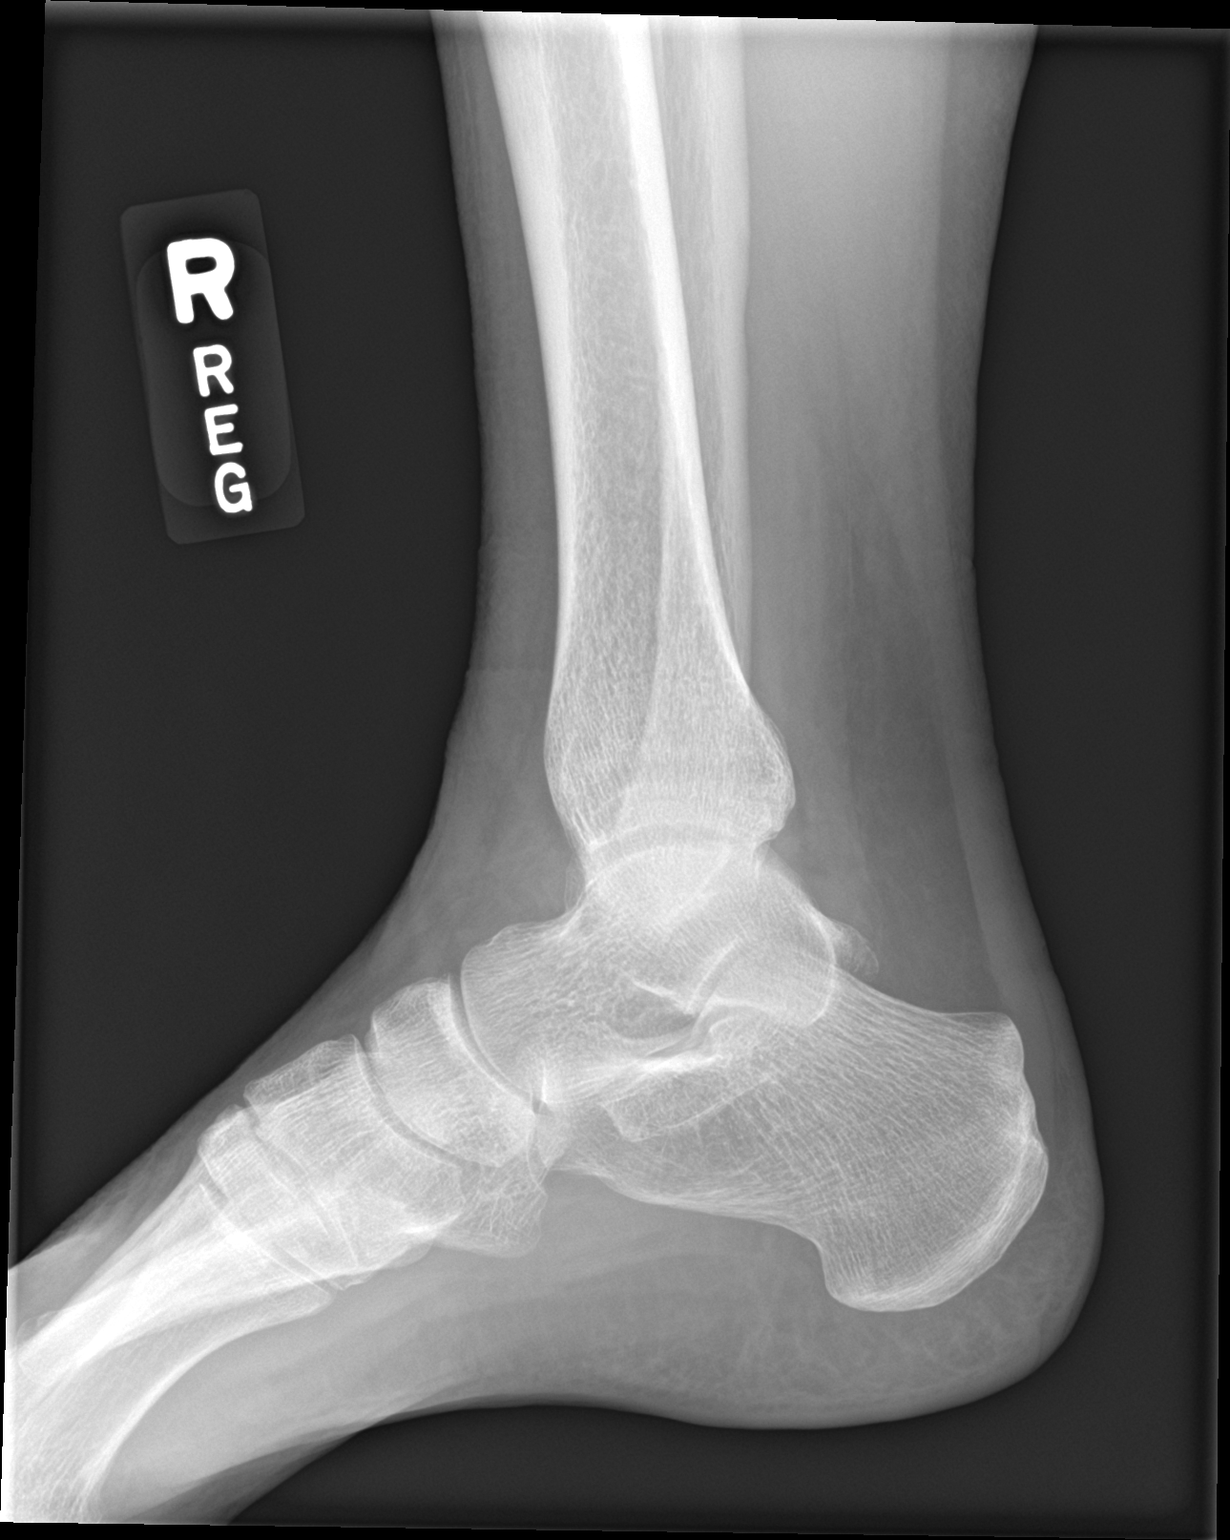

[3 of 3 positions shown; findings below may reference images not displayed]

FINDINGS: Mild osseous demineralization.

Joint spaces preserved.

Thin avulsion fragment adjacent to the lateral aspect of the lateral
malleolus, unchanged.

No additional fracture, dislocation or bone destruction.

Decreased soft tissue swelling since prior study.
IMPRESSION: Again identified thin avulsion fragment at lateral aspect of lateral
malleolus RIGHT ankle.

No additional osseous abnormalities.

## 2020-04-11 DIAGNOSIS — H43812 Vitreous degeneration, left eye: Secondary | ICD-10-CM | POA: Diagnosis not present

## 2020-04-18 DIAGNOSIS — H43813 Vitreous degeneration, bilateral: Secondary | ICD-10-CM | POA: Diagnosis not present

## 2020-04-18 DIAGNOSIS — H33312 Horseshoe tear of retina without detachment, left eye: Secondary | ICD-10-CM | POA: Diagnosis not present

## 2020-04-18 DIAGNOSIS — H43391 Other vitreous opacities, right eye: Secondary | ICD-10-CM | POA: Diagnosis not present

## 2020-04-18 DIAGNOSIS — H4312 Vitreous hemorrhage, left eye: Secondary | ICD-10-CM | POA: Diagnosis not present

## 2020-05-04 ENCOUNTER — Ambulatory Visit: Payer: Federal, State, Local not specified - PPO

## 2020-05-04 DIAGNOSIS — H43391 Other vitreous opacities, right eye: Secondary | ICD-10-CM | POA: Diagnosis not present

## 2020-05-04 DIAGNOSIS — H43813 Vitreous degeneration, bilateral: Secondary | ICD-10-CM | POA: Diagnosis not present

## 2020-05-04 DIAGNOSIS — H4312 Vitreous hemorrhage, left eye: Secondary | ICD-10-CM | POA: Diagnosis not present

## 2020-05-04 DIAGNOSIS — H31092 Other chorioretinal scars, left eye: Secondary | ICD-10-CM | POA: Diagnosis not present

## 2020-05-17 ENCOUNTER — Ambulatory Visit (INDEPENDENT_AMBULATORY_CARE_PROVIDER_SITE_OTHER): Payer: Federal, State, Local not specified - PPO | Admitting: Osteopathic Medicine

## 2020-05-17 ENCOUNTER — Other Ambulatory Visit: Payer: Self-pay

## 2020-05-17 VITALS — BP 117/71 | HR 85 | Temp 98.8°F | Resp 20 | Ht 62.0 in | Wt 131.0 lb

## 2020-05-17 DIAGNOSIS — Z23 Encounter for immunization: Secondary | ICD-10-CM

## 2020-05-17 DIAGNOSIS — H43812 Vitreous degeneration, left eye: Secondary | ICD-10-CM | POA: Diagnosis not present

## 2020-05-17 NOTE — Progress Notes (Signed)
Established Patient Office Visit  Subjective:  Patient ID: Kristen Nichols, female    DOB: 1955-05-31  Age: 65 y.o. MRN: [id number]  CC:  Chief Complaint  Patient presents with  . Immunizations    HPI Kristen Nichols presents for her 2nd shingles vaccine. Shingrix given in left deltoid, pt tolerated well without apparent complications.   Past Medical History:  Diagnosis Date  . Allergy    Hayfever  . Anxiety   . Benign essential hypertension 10/13/2012   Last Assessment & Plan:  Well controlled.    . Colitis   . Diverticulosis   . GERD (gastroesophageal reflux disease)   . Headache(784.0)   . History of colon polyps   . Hypertension   . IBS (irritable bowel syndrome)   . PONV (postoperative nausea and vomiting)    after ankle surgery in 1994  . UC (ulcerative colitis) [facility])     Past Surgical History:  Procedure Laterality Date  . ANKLE SURGERY Left    pin in place   . BREAST BIOPSY Right 2017   marker in place   . BREAST CYST ASPIRATION Right 2016  . CESAREAN SECTION     x2  . COLONOSCOPY     has had 2 or 3 of them per patient. Last one was around 2016 with [facility] or maybe [facility] , last february 2020 now toal of 4   . ESOPHAGOGASTRODUODENOSCOPY (EGD) WITH PROPOFOL N/A 12/11/2018   Procedure: ESOPHAGOGASTRODUODENOSCOPY (EGD) WITH PROPOFOL;  Surgeon: Lavena Bullion, DO;  Location: [facility];  Service: Gastroenterology;  Laterality: N/A;  . MALONEY DILATION  12/11/2018   Procedure: MALONEY DILATION;  Surgeon: Lavena Bullion, DO;  Location: [facility];  Service: Gastroenterology;;  . NASAL SINUS SURGERY    . TRANSORAL INCISIONLESS FUNDOPLICATION N/A 03/12/7423   Procedure: TRANSORAL INCISIONLESS FUNDOPLICATION;  Surgeon: Lavena Bullion, DO;  Location: [facility];  Service: Gastroenterology;  Laterality: N/A;    Family History  Problem Relation Age of Onset  . Other Mother   . Other Father   . Cancer Father        small cell lung  cancer-limited   . Diabetes Maternal Grandmother   . Colon cancer Neg Hx   . Esophageal cancer Neg Hx     Social History   Socioeconomic History  . Marital status: Married    Spouse name: Not on file  . Number of children: 2  . Years of education: Not on file  . Highest education level: Not on file  Occupational History  . Not on file  Tobacco Use  . Smoking status: Former Smoker    Packs/day: 0.50    Years: 20.00    Pack years: 10.00    Types: Cigarettes    Quit date: 01/09/2016    Years since quitting: 4.3  . Smokeless tobacco: Never Used  Vaping Use  . Vaping Use: Former  Substance and Sexual Activity  . Alcohol use: Yes    Alcohol/week: 2.0 standard drinks    Types: 2 Cans of beer per week    Comment: 3-4 alcoholic drinks weekly   . Drug use: No  . Sexual activity: Yes    Birth control/protection: None  Other Topics Concern  . Not on file  Social History Narrative  . Not on file   Social Determinants of Health   Financial Resource Strain: Not on file  Food Insecurity: Not on file  Transportation Needs: Not on file  Physical Activity: Not on  file  Stress: Not on file  Social Connections: Not on file  Intimate Partner Violence: Not on file    Outpatient Medications Prior to Visit  Medication Sig Dispense Refill  . buPROPion (WELLBUTRIN XL) 150 MG 24 hr tablet Take 1 tablet (150 mg total) by mouth daily. 90 tablet 3  . diclofenac sodium (VOLTAREN) 1 % GEL Apply 4 g topically 4 (four) times daily. To affected joint. (Patient taking differently: Apply 4 g topically 4 (four) times daily as needed (pain). To affected joint.) 100 g 11  . fexofenadine (ALLEGRA) 180 MG tablet Take 180 mg by mouth daily as needed for allergies or rhinitis.    Marland Kitchen FIBER PO Take by mouth daily. 1 Tablespoon daily    . FLUoxetine (PROZAC) 20 MG tablet TAKE 1 TABLET (20 MG TOTAL) BY MOUTH DAILY. 90 tablet 3  . fluticasone (FLONASE) 50 MCG/ACT nasal spray SPRAY 2 SPRAYS INTO EACH NOSTRIL  EVERY DAY 48 g 4  . hydrochlorothiazide (HYDRODIURIL) 25 MG tablet Take 1 tablet (25 mg total) by mouth daily. 90 tablet 3  . ibuprofen (ADVIL,MOTRIN) 200 MG tablet Take 600-800 mg by mouth every 6 (six) hours as needed for moderate pain.    Marland Kitchen lidocaine-prilocaine (EMLA) cream Apply 1 application topically as needed. 30 g 1  . Propylene Glycol (SYSTANE BALANCE) 0.6 % SOLN Place 1 drop into both eyes 2 (two) times daily as needed (dry eyes).    . simethicone (MYLICON) 80 MG chewable tablet Chew 1 tablet (80 mg total) by mouth every 6 (six) hours as needed for flatulence (gas, bloating, abdominal discomfort). 30 tablet 0   No facility-administered medications prior to visit.    No Known Allergies  ROS Review of Systems    Objective:    Physical Exam  BP 117/71 (BP Location: Left Arm, Patient Position: Sitting, Cuff Size: Normal)   Pulse 85   Temp 98.8 F (37.1 C) (Oral)   Resp 20   Ht 5' 2"  (1.575 m)   Wt 131 lb (59.4 kg)   SpO2 100%   BMI 23.96 kg/m  Wt Readings from Last 3 Encounters:  05/17/20 131 lb (59.4 kg)  03/01/20 132 lb 1.9 oz (59.9 kg)  01/19/20 133 lb 1.9 oz (60.4 kg)     There are no preventive care reminders to display for this patient.  There are no preventive care reminders to display for this patient.  Lab Results  Component Value Date   TSH 0.989 07/03/2013   Lab Results  Component Value Date   WBC 7.1 02/26/2020   HGB 15.0 02/26/2020   HCT 43.3 02/26/2020   MCV 92.5 02/26/2020   PLT 325 02/26/2020   Lab Results  Component Value Date   NA 141 02/26/2020   K 4.2 02/26/2020   CO2 31 02/26/2020   GLUCOSE 90 02/26/2020   BUN 16 02/26/2020   CREATININE 0.80 02/26/2020   BILITOT 0.5 02/26/2020   ALKPHOS 88 07/04/2013   AST 19 02/26/2020   ALT 23 02/26/2020   PROT 6.7 02/26/2020   ALBUMIN 3.4 (L) 07/04/2013   CALCIUM 9.5 02/26/2020   Lab Results  Component Value Date   CHOL 202 (H) 02/26/2020   Lab Results  Component Value Date   HDL  72 02/26/2020   Lab Results  Component Value Date   LDLCALC 113 (H) 02/26/2020   Lab Results  Component Value Date   TRIG 76 02/26/2020   Lab Results  Component Value Date   CHOLHDL 2.8  02/26/2020   Lab Results  Component Value Date   HGBA1C 4.7 05/08/2019      Assessment & Plan:  Shingrix given in left deltoid, pt tolerated well without apparent complications.  Problem List Items Addressed This Visit   None   Visit Diagnoses    Need for shingles vaccine    -  Primary   Relevant Orders   Varicella-zoster vaccine IM (Shingrix) (Completed)      No orders of the defined types were placed in this encounter.   Follow-up: No follow-ups on file.    Ninfa Meeker, CMA

## 2020-06-13 IMAGING — MG DIGITAL SCREENING BILAT W/ TOMO W/ CAD
6 of 10 series · 6 of 30 positions shown · non-contrast
Comparison: Previous exam(s).

CLINICAL DATA: Screening.

EXAM:
DIGITAL SCREENING BILATERAL MAMMOGRAM WITH TOMO AND CAD

[R MLO synth-2D]
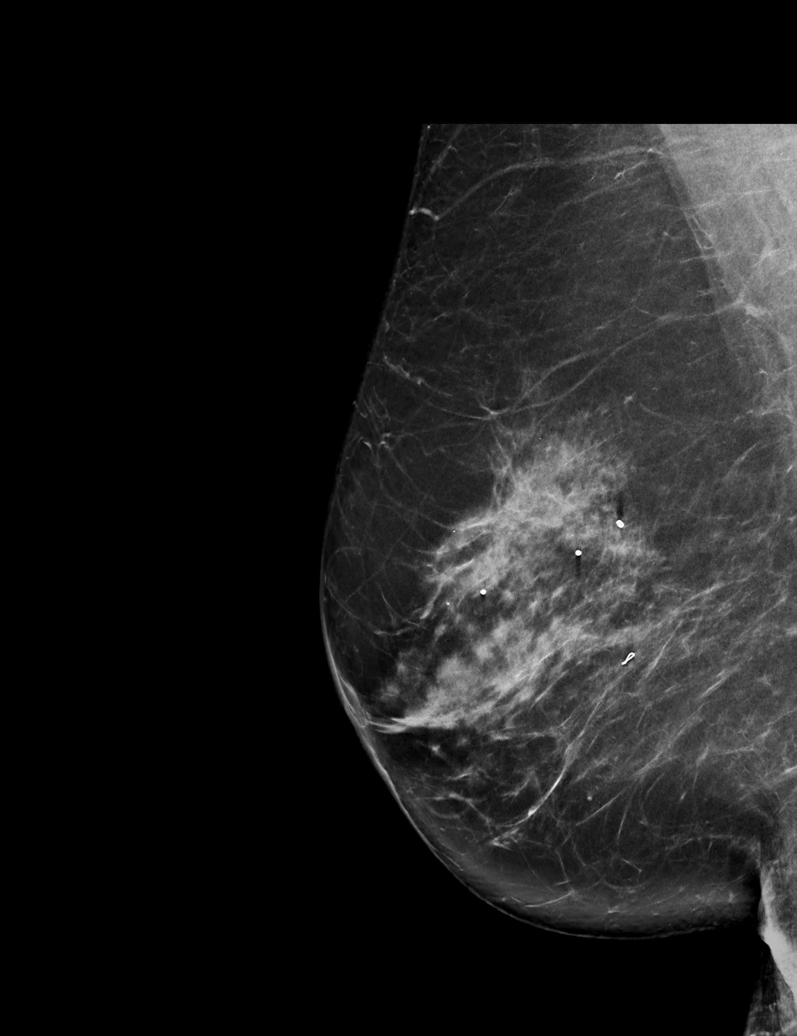

[R CC synth-2D]
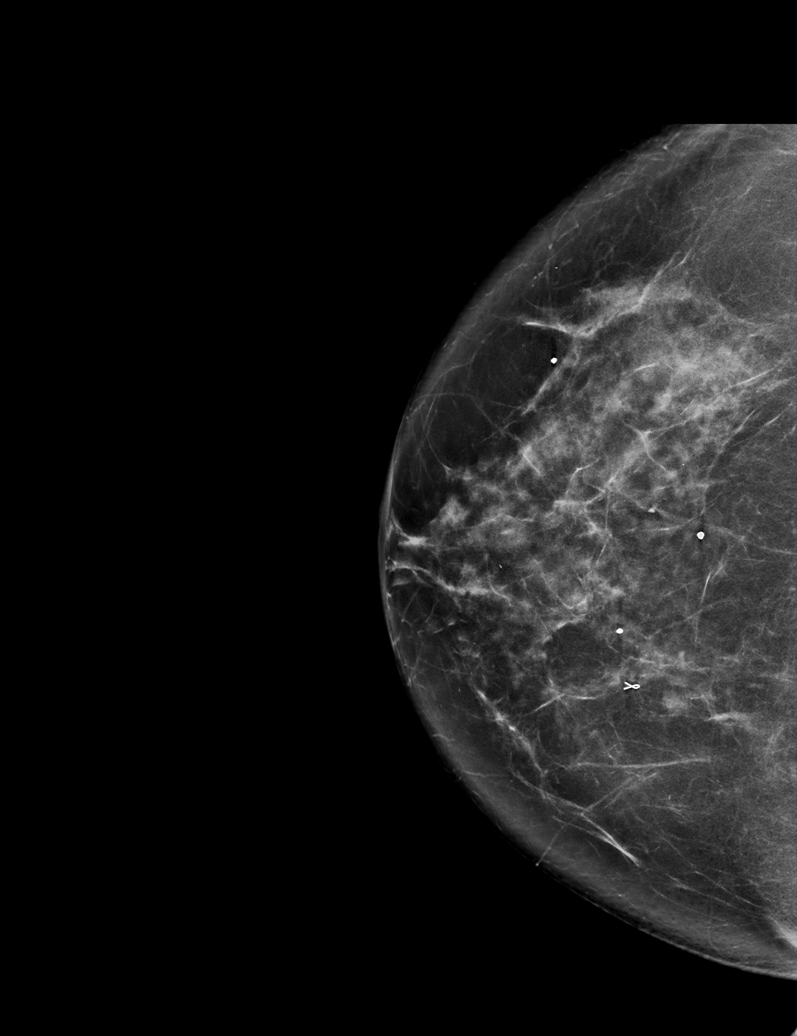

[L MLO synth-2D (1 of 2)]
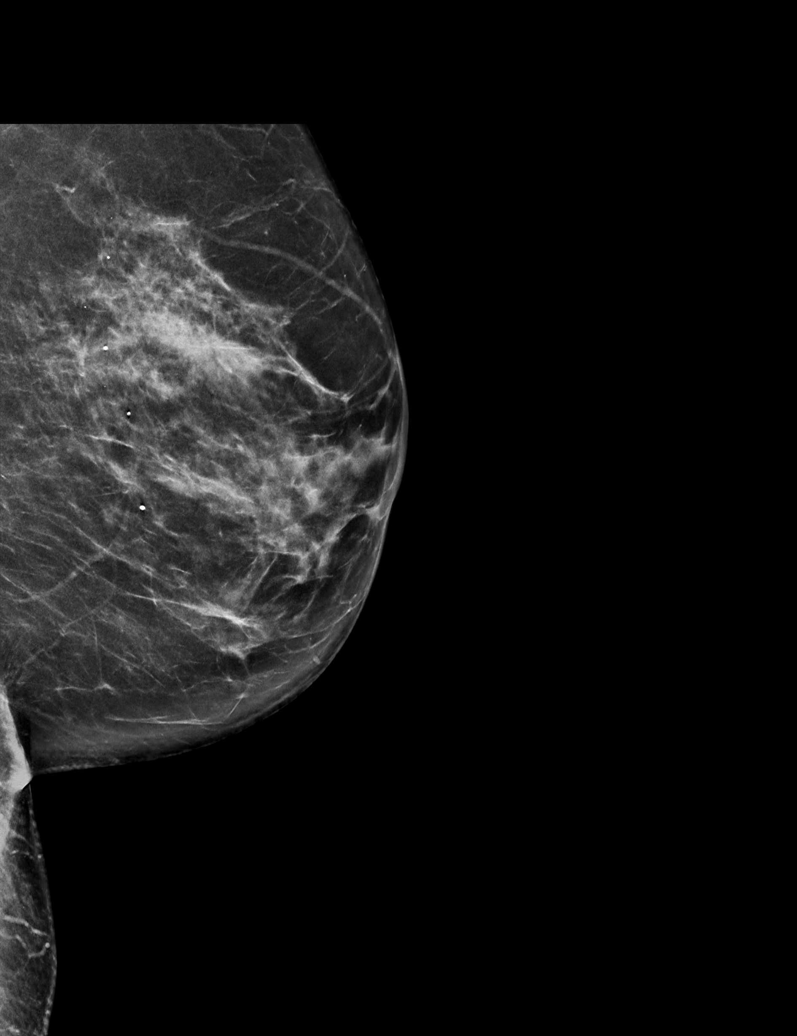

[L MLO synth-2D (2 of 2)]
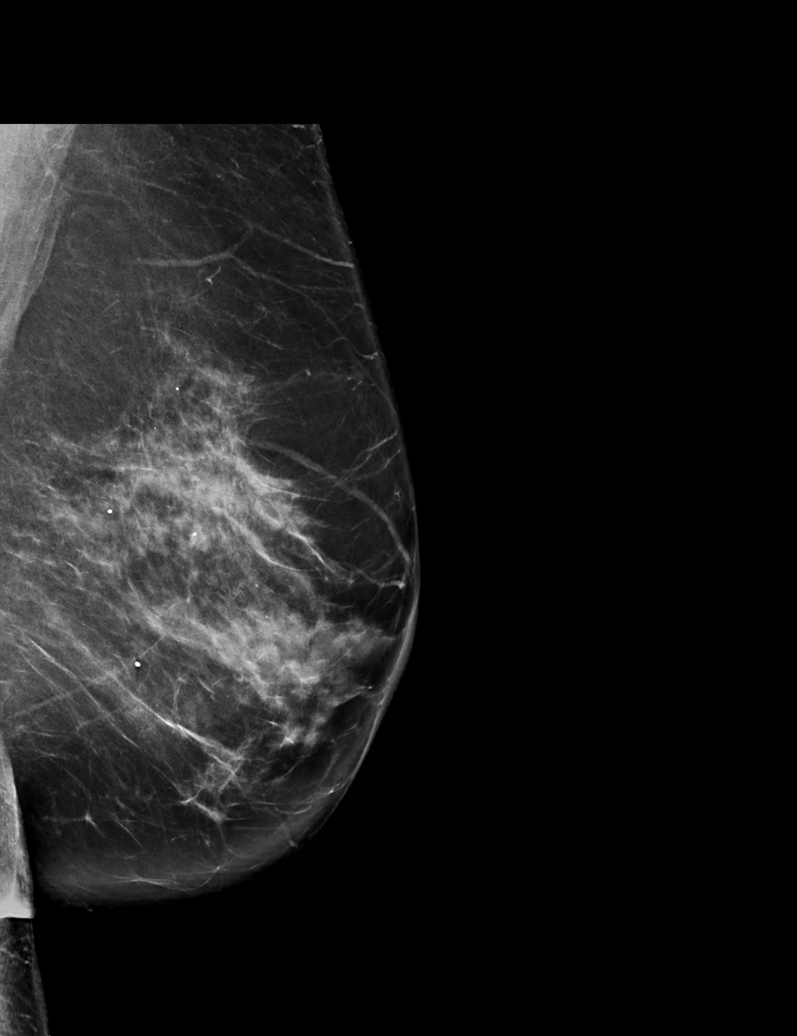

[L CC synth-2D]
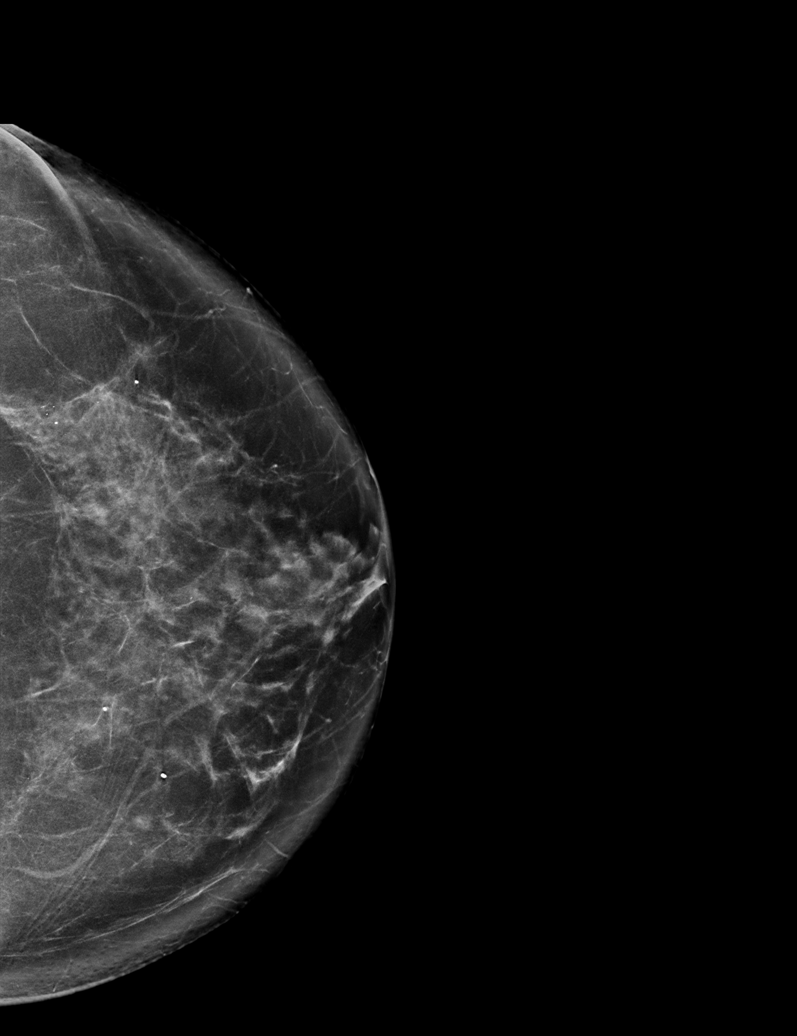

[R MLO tomo · tomo slice 40/79.0]
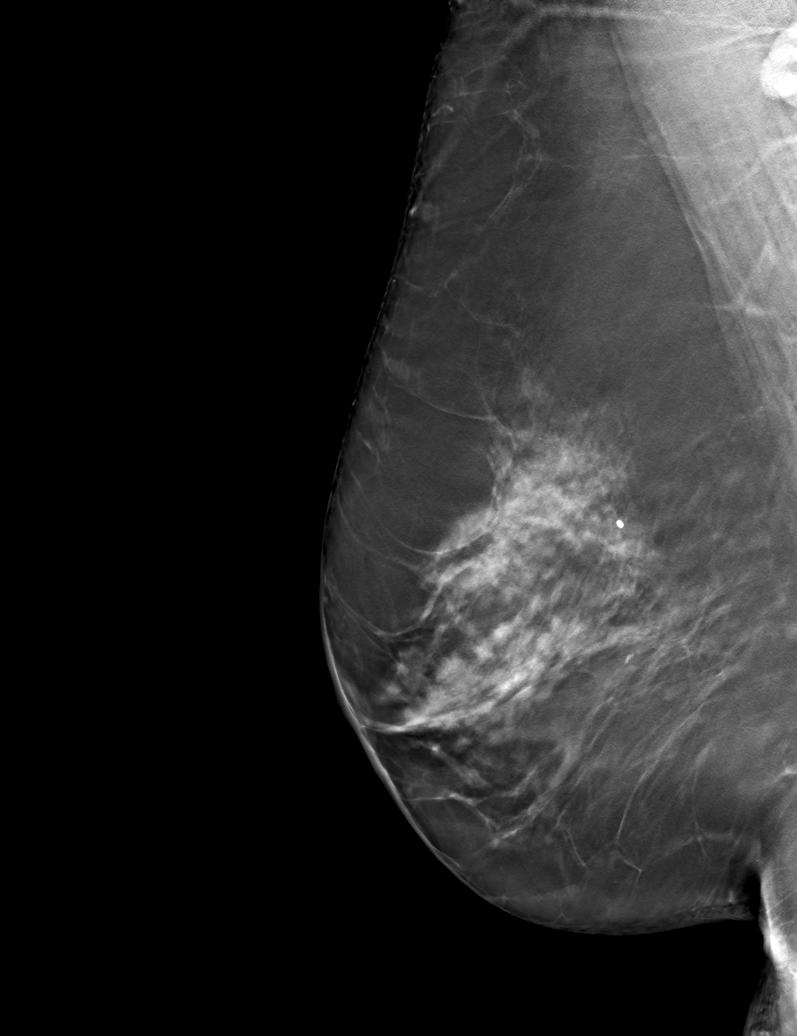

[6 of 30 positions shown; findings below may reference images not displayed]

ACR Breast Density Category c: The breast tissue is heterogeneously
dense, which may obscure small masses.
FINDINGS: There are no findings suspicious for malignancy. Images were
processed with CAD.
IMPRESSION: No mammographic evidence of malignancy. A result letter of this
screening mammogram will be mailed directly to the patient.

RECOMMENDATION:
Screening mammogram in one year. (Code:[id number])

BI-RADS CATEGORY  1: Negative.

## 2020-06-24 ENCOUNTER — Other Ambulatory Visit: Payer: Self-pay | Admitting: Osteopathic Medicine

## 2020-11-10 ENCOUNTER — Other Ambulatory Visit: Payer: Self-pay

## 2020-11-10 DIAGNOSIS — F325 Major depressive disorder, single episode, in full remission: Secondary | ICD-10-CM

## 2020-12-19 ENCOUNTER — Ambulatory Visit (HOSPITAL_COMMUNITY): Payer: Self-pay | Admitting: Psychiatry

## 2020-12-20 ENCOUNTER — Ambulatory Visit (INDEPENDENT_AMBULATORY_CARE_PROVIDER_SITE_OTHER): Payer: Federal, State, Local not specified - PPO | Admitting: Gastroenterology

## 2020-12-20 ENCOUNTER — Other Ambulatory Visit: Payer: Self-pay

## 2020-12-20 ENCOUNTER — Encounter: Payer: Self-pay | Admitting: Gastroenterology

## 2020-12-20 VITALS — BP 122/70 | HR 82 | Ht 62.0 in | Wt 135.0 lb

## 2020-12-20 DIAGNOSIS — K219 Gastro-esophageal reflux disease without esophagitis: Secondary | ICD-10-CM | POA: Diagnosis not present

## 2020-12-20 DIAGNOSIS — R12 Heartburn: Secondary | ICD-10-CM

## 2020-12-20 MED ORDER — SUCRALFATE 1 GM/10ML PO SUSP
1.0000 g | Freq: Four times a day (QID) | ORAL | 1 refills | Status: DC
Start: 2020-12-20 — End: 2021-01-30

## 2020-12-20 MED ORDER — PANTOPRAZOLE SODIUM 40 MG PO TBEC: 40.0000 mg | DELAYED_RELEASE_TABLET | Freq: Two times a day (BID) | ORAL | 3 refills | Status: DC

## 2020-12-20 NOTE — Patient Instructions (Signed)
If you are age 63 or older, your body mass index should be between 23-30. Your Body mass index is 24.69 kg/m. If this is out of the aforementioned range listed, please consider follow up with your Primary Care Provider.  If you are age 27 or younger, your body mass index should be between 19-25. Your Body mass index is 24.69 kg/m. If this is out of the aformentioned range listed, please consider follow up with your Primary Care Provider.   __________________________________________________________  The [place] providers would like to encourage you to use [facility] to communicate with providers for non-urgent requests or questions.  Due to long hold times on the telephone, sending your provider a message by [facility] may be a faster and more efficient way to get a response.  Please allow 48 business hours for a response.  Please remember that this is for non-urgent requests.    We have sent the following medications to your pharmacy for you to pick up at your convenience:  Pantoprazole 2m twice daily Carafate oral suspension 1gm four times a day  Due to recent changes in healthcare laws, you may see the results of your imaging and laboratory studies on MyChart before your provider has had a chance to review them.  We understand that in some cases there may be results that are confusing or concerning to you. Not all laboratory results come back in the same time frame and the provider may be waiting for multiple results in order to interpret others.  Please give [id number] hours in order for your provider to thoroughly review all the results before contacting the office for clarification of your results.   Thank you for choosing me and [facility].  Vito Cirigliano, D.O.

## 2020-12-20 NOTE — Progress Notes (Signed)
Chief Complaint:    Reflux symptoms  GI History: 65 year old female initially seen by me on 02/19/2018 for alternating bowel habits, abdominal pain, and reflux symptoms, now s/p Transoral Incisionless Fundoplication (TIF) 74/09/4494.   Longstanding history of GERD, complicated by erosive esophagitis. Index symptoms of heartburn and regurgitation, belch. More often at night.  No dysphagia or odynophagia.  Previous intolerance to omeprazole daily (nausea), so she was only taking intermittently.   EGD in 02/2018 with LA grade A esophagitis, 1 cm hiatal hernia with a Hill grade 2 valve.  Prescribed Prilosec 20 mg p.o. twice daily x8 weeks with good clinical response to therapy, but still with intermittent breakthrough sxs. She eventually tritrated off due to nausea with PPI. Completed TIF on 12/11/2018 with resolution of index reflux symptoms.   Endoscopic history: -EGD with Transoral Incisionless Fundoplication (75/9163, Dr. Bryan Lemma): LA Grade A esophagitis, 1 cm HH, 22 fasteners placed with 270 degree valve 3 cm in length -EGD (2020, Dr. Bryan Lemma): 1 cm hiatal hernia, LA grade A esophagitis, Hill grade 2 valve, normal stomach and duodenum. -Colonoscopy (2020, Dr. Bryan Lemma): 3 small, 2 to 3 mm, Hyperplastic polyps, sigmoid diverticulosis, otherwise normal with biopsies negative for [facility].  Recommended repeat in 10 years. -Colonoscopy (2014, Dr. Harl Bowie at [facility]): 5 mm polyp in cecum and 3 mm polyp in transverse colon (path polypoid colonic mucosa with benign reactive lymphoid hyperplasia), normal TI, sigmoid diverticulosis -Colonoscopy (2012, [place]): No report available for review.  Patient reports polyps  HPI:     Patient is a 65 y.o. female presenting to the Gastroenterology Clinic for evaluation of recent recurrence of reflux symptoms.  Last seen by me on 04/17/2019 as postoperative follow-up after TIF in 12/2018.  Was without any reflux symptoms at that time and had  titrated off all acid suppression therapy without any breakthrough.  Was sleeping flat, no issues with forward flexion, and no dietary restrictions.  Since then, she reports having heartburn recurrence. Started in April as mild, intermittent HB and rare episodes of regurgitation with forward flexion. Sxs have increased in frequency over the last 6 weeks or so, now with daily heartburn and regurgitation.  Started taking Tums and Prilosec without improvement. Went back to liquid diet this past weekend to limit sxs.   Otherwise, no new labs or abdominal/GI imaging for review today.  Review of systems:     No chest pain, no SOB, no fevers, no urinary sx   Past Medical History:  Diagnosis Date   Allergy    Hayfever   Anxiety    Benign essential hypertension 10/13/2012   Last Assessment & Plan:  Well controlled.     Colitis    Diverticulosis    GERD (gastroesophageal reflux disease)    Headache(784.0)    History of colon polyps    Hypertension    IBS (irritable bowel syndrome)    PONV (postoperative nausea and vomiting)    after ankle surgery in 1994   UC (ulcerative colitis) ([place])     Patient's surgical history, family medical history, social history, medications and allergies were all reviewed in Epic    Current Outpatient Medications  Medication Sig Dispense Refill   buPROPion (WELLBUTRIN XL) 150 MG 24 hr tablet TAKE 1 TABLET BY MOUTH EVERY DAY 90 tablet 3   diclofenac sodium (VOLTAREN) 1 % GEL Apply 4 g topically 4 (four) times daily. To affected joint. (Patient taking differently: Apply 4 g topically 4 (four) times daily as needed (pain). To  affected joint.) 100 g 11   fexofenadine (ALLEGRA) 180 MG tablet Take 180 mg by mouth daily as needed for allergies or rhinitis.     FIBER PO Take by mouth daily. 1 Tablespoon daily     FLUoxetine (PROZAC) 20 MG tablet TAKE 1 TABLET BY MOUTH EVERY DAY 90 tablet 3   fluticasone (FLONASE) 50 MCG/ACT nasal spray SPRAY 2 SPRAYS INTO EACH NOSTRIL  EVERY DAY 48 g 4   hydrochlorothiazide (HYDRODIURIL) 25 MG tablet Take 1 tablet (25 mg total) by mouth daily. 90 tablet 3   ibuprofen (ADVIL,MOTRIN) 200 MG tablet Take 600-800 mg by mouth every 6 (six) hours as needed for moderate pain.     lidocaine-prilocaine (EMLA) cream Apply 1 application topically as needed. 30 g 1   Propylene Glycol (SYSTANE BALANCE) 0.6 % SOLN Place 1 drop into both eyes 2 (two) times daily as needed (dry eyes).     No current facility-administered medications for this visit.    Physical Exam:     BP 122/70    Pulse 82    Ht 5' 2"  (1.575 m)    Wt 135 lb (61.2 kg)    SpO2 99%    BMI 24.69 kg/m   GENERAL:  Pleasant female in NAD PSYCH: : Cooperative, normal affect EENT:  conjunctiva pink, mucous membranes moist, neck supple without masses SKIN:  turgor, no lesions seen Musculoskeletal:  Normal muscle tone, normal strength NEURO: Alert and oriented x 3, no focal neurologic deficits   IMPRESSION and PLAN:    1) Heartburn 2) GERD Nene has been doing quite well since TIF in 12/2018, with complete resolution of symptoms postoperatively.  Unfortunately sounds like breakthrough reflux over the last couple of months. - Start Protonix 40 mg bid - Start Carafate 1 g qid - Expedited EGD to evaluate for erosive esophagitis, LES laxity, wrap integrity.  Discussed the possibility that if the TIF has become unwrapped, could potentially do repeat TIF - Continue antireflux lifestyle/dietary modifications  The indications, risks, and benefits of EGD were explained to the patient in detail. Risks include but are not limited to bleeding, perforation, adverse reaction to medications, and cardiopulmonary compromise. Sequelae include but are not limited to the possibility of surgery, hospitalization, and mortality. The patient verbalized understanding and wished to proceed. All questions answered, referred to scheduler. Further recommendations pending results of the exam.     Lavena Bullion ,DO, FACG 12/20/2020, 2:29 PM

## 2021-01-04 ENCOUNTER — Ambulatory Visit (HOSPITAL_COMMUNITY): Payer: Federal, State, Local not specified - PPO | Admitting: Psychiatry

## 2021-01-06 ENCOUNTER — Ambulatory Visit (AMBULATORY_SURGERY_CENTER): Payer: Federal, State, Local not specified - PPO | Admitting: Gastroenterology

## 2021-01-06 ENCOUNTER — Encounter: Payer: Self-pay | Admitting: Gastroenterology

## 2021-01-06 VITALS — BP 129/71 | HR 81 | Temp 97.1°F | Resp 20 | Ht 62.0 in | Wt 135.0 lb

## 2021-01-06 DIAGNOSIS — K219 Gastro-esophageal reflux disease without esophagitis: Secondary | ICD-10-CM | POA: Diagnosis not present

## 2021-01-06 DIAGNOSIS — K209 Esophagitis, unspecified without bleeding: Secondary | ICD-10-CM

## 2021-01-06 DIAGNOSIS — K2100 Gastro-esophageal reflux disease with esophagitis, without bleeding: Secondary | ICD-10-CM

## 2021-01-06 DIAGNOSIS — Z9889 Other specified postprocedural states: Secondary | ICD-10-CM

## 2021-01-06 DIAGNOSIS — R12 Heartburn: Secondary | ICD-10-CM | POA: Diagnosis not present

## 2021-01-06 MED ORDER — SODIUM CHLORIDE 0.9 % IV SOLN: 500.0000 mL | Freq: Once | INTRAVENOUS | Status: DC

## 2021-01-06 NOTE — Patient Instructions (Signed)
Await pathology results from the biopsies taken today.  Continue advancing diet as tolerated and continue present medications.  Information on esophagitis given to you today.  Continue Protonix 40 mg twice a day for another 4 weeks then reduce to 40 mg once a day as tolerated.  Resume Carafate as prescribed for another 2 weeks, then decrease to as needed.  Return to GI clinic at appointment to be scheduled.    YOU HAD AN ENDOSCOPIC PROCEDURE TODAY AT [facility]:   Refer to the procedure report that was given to you for any specific questions about what was found during the examination.  If the procedure report does not answer your questions, please call your gastroenterologist to clarify.  If you requested that your care partner not be given the details of your procedure findings, then the procedure report has been included in a sealed envelope for you to review at your convenience later.  YOU SHOULD EXPECT: Some feelings of bloating in the abdomen. Passage of more gas than usual.  Walking can help get rid of the air that was put into your GI tract during the procedure and reduce the bloating. If you had a lower endoscopy (such as a colonoscopy or flexible sigmoidoscopy) you may notice spotting of blood in your stool or on the toilet paper. If you underwent a bowel prep for your procedure, you may not have a normal bowel movement for a few days.  Please Note:  You might notice some irritation and congestion in your nose or some drainage.  This is from the oxygen used during your procedure.  There is no need for concern and it should clear up in a day or so.  SYMPTOMS TO REPORT IMMEDIATELY:   Following upper endoscopy (EGD)  Vomiting of blood or coffee ground material  New chest pain or pain under the shoulder blades  Painful or persistently difficult swallowing  New shortness of breath  Fever of 100F or higher  Black, tarry-looking stools  For urgent or emergent  issues, a gastroenterologist can be reached at any hour by calling [phone]. Do not use MyChart messaging for urgent concerns.    DIET:  We do recommend a small meal at first, but then you may proceed to your regular diet.  Drink plenty of fluids but you should avoid alcoholic beverages for 24 hours.  ACTIVITY:  You should plan to take it easy for the rest of today and you should NOT DRIVE or use heavy machinery until tomorrow (because of the sedation medicines used during the test).    FOLLOW UP: Our staff will call the number listed on your records 48-72 hours following your procedure to check on you and address any questions or concerns that you may have regarding the information given to you following your procedure. If we do not reach you, we will leave a message.  We will attempt to reach you two times.  During this call, we will ask if you have developed any symptoms of COVID 19. If you develop any symptoms (ie: fever, flu-like symptoms, shortness of breath, cough etc.) before then, please call [phone].  If you test positive for Covid 19 in the 2 weeks post procedure, please call and report this information to [place].    If any biopsies were taken you will be contacted by phone or by letter within the next 1-3 weeks.  Please call us at [phone] if you have not heard about the biopsies in 3  weeks.    SIGNATURES/CONFIDENTIALITY: You and/or your care partner have signed paperwork which will be entered into your electronic medical record.  These signatures attest to the fact that that the information above on your After Visit Summary has been reviewed and is understood.  Full responsibility of the confidentiality of this discharge information lies with you and/or your care-partner.

## 2021-01-06 NOTE — Progress Notes (Signed)
Pt's states no medical or surgical changes since previsit or office visit. 

## 2021-01-06 NOTE — Progress Notes (Signed)
GASTROENTEROLOGY PROCEDURE H&P NOTE   Primary Care Physician: Emeterio Reeve, DO    Reason for Procedure:   GERD, reflux sxs s/p TIF  Plan:    EGD  Patient is appropriate for endoscopic procedure(s) in the ambulatory ([facility]) setting.  The nature of the procedure, as well as the risks, benefits, and alternatives were carefully and thoroughly reviewed with the patient. Ample time for discussion and questions allowed. The patient understood, was satisfied, and agreed to proceed.     HPI: Kristen Nichols is a 65 y.o. female who presents for EGD for evaluation of reflux sxs .  Patient was most recently seen in the Gastroenterology Clinic on 12/20/2020.  No interval change in medical history since that appointment. Please refer to that note for full details regarding GI history and clinical presentation.   Past Medical History:  Diagnosis Date   Allergy    Hayfever   Anxiety    Benign essential hypertension 10/13/2012   Last Assessment & Plan:  Well controlled.     Colitis    Diverticulosis    GERD (gastroesophageal reflux disease)    Headache(784.0)    History of colon polyps    Hypertension    IBS (irritable bowel syndrome)    PONV (postoperative nausea and vomiting)    after ankle surgery in 1994   UC (ulcerative colitis) ([place])     Past Surgical History:  Procedure Laterality Date   ANKLE SURGERY Left    pin in place    BREAST BIOPSY Right 2017   marker in place    BREAST CYST ASPIRATION Right 2016   CESAREAN SECTION     x2   COLONOSCOPY     has had 2 or 3 of them per patient. Last one was around 2016 with [facility] or maybe [facility] , last february 2020 now toal of 4    ESOPHAGOGASTRODUODENOSCOPY (EGD) WITH PROPOFOL N/A 12/11/2018   Procedure: ESOPHAGOGASTRODUODENOSCOPY (EGD) WITH PROPOFOL;  Surgeon: Lavena Bullion, DO;  Location: [facility];  Service: Gastroenterology;  Laterality: N/A;   MALONEY DILATION  12/11/2018   Procedure: MALONEY DILATION;   Surgeon: Lavena Bullion, DO;  Location: [facility];  Service: Gastroenterology;;   NASAL SINUS SURGERY     TRANSORAL INCISIONLESS FUNDOPLICATION N/A 67/05/9161   Procedure: TRANSORAL INCISIONLESS FUNDOPLICATION;  Surgeon: Lavena Bullion, DO;  Location: [facility];  Service: Gastroenterology;  Laterality: N/A;    Prior to Admission medications   Medication Sig Start Date End Date Taking? Authorizing Provider  buPROPion (WELLBUTRIN XL) 150 MG 24 hr tablet TAKE 1 TABLET BY MOUTH EVERY DAY 06/24/20  Yes Emeterio Reeve, DO  fexofenadine (ALLEGRA) 180 MG tablet Take 180 mg by mouth daily as needed for allergies or rhinitis.   Yes [provider]  FIBER PO Take by mouth daily. 1 Tablespoon daily   Yes [provider]  FLUoxetine (PROZAC) 20 MG tablet TAKE 1 TABLET BY MOUTH EVERY DAY 06/24/20  Yes Emeterio Reeve, DO  fluticasone [facility]) 50 MCG/ACT nasal spray SPRAY 2 SPRAYS INTO EACH NOSTRIL EVERY DAY 03/01/20  Yes Emeterio Reeve, DO  hydrochlorothiazide (HYDRODIURIL) 25 MG tablet Take 1 tablet (25 mg total) by mouth daily. 03/01/20  Yes Emeterio Reeve, DO  pantoprazole (PROTONIX) 40 MG tablet Take 1 tablet (40 mg total) by mouth 2 (two) times daily. 12/20/20  Yes Glorie Dowlen V, DO  sucralfate (CARAFATE) 1 GM/10ML suspension Take 10 mLs (1 g total) by mouth 4 (four) times daily. 12/20/20  Yes  Melissa Pulido V, DO  diclofenac sodium (VOLTAREN) 1 % GEL Apply 4 g topically 4 (four) times daily. To affected joint. Patient taking differently: Apply 4 g topically 4 (four) times daily as needed (pain). To affected joint. 09/11/18   Gregor Hams, MD  ibuprofen (ADVIL,MOTRIN) 200 MG tablet Take 600-800 mg by mouth every 6 (six) hours as needed for moderate pain.    [provider]  lidocaine-prilocaine (EMLA) cream Apply 1 application topically as needed. 01/19/20   Emeterio Reeve, DO  Propylene Glycol (SYSTANE BALANCE) 0.6 % SOLN Place 1 drop into both  eyes 2 (two) times daily as needed (dry eyes).    [provider]    Current Outpatient Medications  Medication Sig Dispense Refill   buPROPion (WELLBUTRIN XL) 150 MG 24 hr tablet TAKE 1 TABLET BY MOUTH EVERY DAY 90 tablet 3   fexofenadine (ALLEGRA) 180 MG tablet Take 180 mg by mouth daily as needed for allergies or rhinitis.     FIBER PO Take by mouth daily. 1 Tablespoon daily     FLUoxetine (PROZAC) 20 MG tablet TAKE 1 TABLET BY MOUTH EVERY DAY 90 tablet 3   fluticasone (FLONASE) 50 MCG/ACT nasal spray SPRAY 2 SPRAYS INTO EACH NOSTRIL EVERY DAY 48 g 4   hydrochlorothiazide (HYDRODIURIL) 25 MG tablet Take 1 tablet (25 mg total) by mouth daily. 90 tablet 3   pantoprazole (PROTONIX) 40 MG tablet Take 1 tablet (40 mg total) by mouth 2 (two) times daily. 60 tablet 3   sucralfate (CARAFATE) 1 GM/10ML suspension Take 10 mLs (1 g total) by mouth 4 (four) times daily. 420 mL 1   diclofenac sodium (VOLTAREN) 1 % GEL Apply 4 g topically 4 (four) times daily. To affected joint. (Patient taking differently: Apply 4 g topically 4 (four) times daily as needed (pain). To affected joint.) 100 g 11   ibuprofen (ADVIL,MOTRIN) 200 MG tablet Take 600-800 mg by mouth every 6 (six) hours as needed for moderate pain.     lidocaine-prilocaine (EMLA) cream Apply 1 application topically as needed. 30 g 1   Propylene Glycol (SYSTANE BALANCE) 0.6 % SOLN Place 1 drop into both eyes 2 (two) times daily as needed (dry eyes).     Current Facility-Administered Medications  Medication Dose Route Frequency Provider Last Rate Last Admin   0.9 %  sodium chloride infusion  500 mL Intravenous Once Jadi Deyarmin V, DO        Allergies as of 01/06/2021   (No Known Allergies)    Family History  Problem Relation Age of Onset   Other Mother    Other Father    Cancer Father        small cell lung cancer-limited    Diabetes Maternal Grandmother    Colon cancer Neg Hx    Esophageal cancer Neg Hx     Social  History   Socioeconomic History   Marital status: Married    Spouse name: Not on file   Number of children: 2   Years of education: Not on file   Highest education level: Not on file  Occupational History   Not on file  Tobacco Use   Smoking status: Former    Packs/day: 0.50    Years: 20.00    Pack years: 10.00    Types: Cigarettes    Quit date: 01/09/2016    Years since quitting: 4.9   Smokeless tobacco: Never  Vaping Use   Vaping Use: Former  Substance and Sexual Activity  Alcohol use: Yes    Alcohol/week: 2.0 standard drinks    Types: 2 Cans of beer per week    Comment: 3-4 alcoholic drinks weekly    Drug use: No   Sexual activity: Yes    Birth control/protection: None  Other Topics Concern   Not on file  Social History Narrative   Not on file   Social Determinants of Health   Financial Resource Strain: Not on file  Food Insecurity: Not on file  Transportation Needs: Not on file  Physical Activity: Not on file  Stress: Not on file  Social Connections: Not on file  Intimate Partner Violence: Not on file    Physical Exam: Vital signs in last 24 hours: @BP  128/73    Pulse 79    Temp (!) 97.1 F (36.2 C) (Temporal)    Ht 5' 2"  (1.575 m)    Wt 135 lb (61.2 kg)    SpO2 99%    BMI 24.69 kg/m  GEN: NAD EYE: Sclerae anicteric ENT: MMM CV: Non-tachycardic Pulm: CTA b/l GI: Soft, NT/ND NEURO:  Alert & Oriented x [place], DO [facility]   01/06/2021 12:10 PM

## 2021-01-06 NOTE — Progress Notes (Signed)
Called to room to assist during endoscopic procedure.  Patient ID and intended procedure confirmed with present staff. Received instructions for my participation in the procedure from the performing physician.  

## 2021-01-06 NOTE — Progress Notes (Signed)
Report to PACU, RN, vss, BBS= Clear.  

## 2021-01-06 NOTE — Op Note (Signed)
[facility] Patient Name: Kristen Nichols Procedure Date: 01/06/2021 12:10 PM MRN: [id number] Endoscopist: Gerrit Heck , MD Age: 65 Referring MD:  Date of Birth: 08/04/1955 Gender: Female Account #: [id number] Procedure:                Upper GI endoscopy Indications:              Heartburn, Suspected esophageal reflux                           65 yo female with long standing history of GERD,                            s/p TIF in 12/2018 with resolution of reflux sxs                            and subsequently weaned off all acid suppression                            therapy. Started having intermittent reflux sxs in                            04/2020, and increased over the last 6-8 weeks. Was                            started on Protonix 40 mg BID and Carafate QID 2                            weeks ago with improvement. She presents today for                            endoscopic evaluation for esophagitis and assess                            wrap integrity. No dysphagia. Medicines:                Monitored Anesthesia Care Procedure:                Pre-Anesthesia Assessment:                           - Prior to the procedure, a History and Physical                            was performed, and patient medications and                            allergies were reviewed. The patient's tolerance of                            previous anesthesia was also reviewed. The risks                            and benefits of the procedure and the sedation  options and risks were discussed with the patient.                            All questions were answered, and informed consent                            was obtained. Prior Anticoagulants: The patient has                            taken no previous anticoagulant or antiplatelet                            agents. ASA Grade Assessment: II - A patient with                            mild systemic  disease. After reviewing the risks                            and benefits, the patient was deemed in                            satisfactory condition to undergo the procedure.                           After obtaining informed consent, the endoscope was                            passed under direct vision. Throughout the                            procedure, the patient's blood pressure, pulse, and                            oxygen saturations were monitored continuously. The                            GIF [id number] #[id number] was introduced through the                            mouth, and advanced to the second part of duodenum.                            The upper GI endoscopy was accomplished without                            difficulty. The patient tolerated the procedure                            well. Scope In: Scope Out: Findings:                 The upper third of the esophagus and middle third  of the esophagus were normal.                           LA Grade A esophagitis characterized by single                            mucosal break in an otherwise preserved Z line,                            measuring less than 5 mm in length, located 37 cm                            from the incisors. Biopsies were taken with a cold                            forceps for histology. Estimated blood loss was                            minimal.                           The gastroesophageal flap valve was visualized                            endoscopically and classified as Hill Grade III                            (minimal fold, loose to endoscope, hiatal hernia                            likely).                           The gastric fundus, gastric body, incisura, gastric                            antrum and pylorus were normal.                           The examined duodenum was normal. Complications:            No immediate complications. Estimated Blood Loss:      Estimated blood loss was minimal. Impression:               - Normal upper third of esophagus and middle third                            of esophagus.                           - LA Grade A reflux esophagitis with no bleeding.                            Biopsied.                           -  Gastroesophageal flap valve classified as Hill                            Grade III (minimal fold, loose to endoscope, hiatal                            hernia likely).                           - Normal gastric fundus, gastric body, incisura,                            antrum and pylorus.                           - Normal examined duodenum. Recommendation:           - Patient has a contact number available for                            emergencies. The signs and symptoms of potential                            delayed complications were discussed with the                            patient. Return to normal activities tomorrow.                            Written discharge instructions were provided to the                            patient.                           - Continue advancing diet as tolerated.                           - Continue present medications.                           - Continue Protonix (pantoprazole) 40 mg PO BID for                            another 4 weeks, then reduce to 40 mg daily as                            tolerated.                           - Resume Carafate as prescribed for another 2                            weeks, then decrease to as needed.                           -  Await pathology results.                           - Return to GI clinic at appointment to be                            scheduled. Will discuss the role of repeat TIF vs                            other antireflux surgical options at that                            appointment. In the interim, I will discuss with                            surgical colleagues as well. Gerrit Heck,  MD 01/06/2021 12:39:49 PM

## 2021-01-10 ENCOUNTER — Telehealth: Payer: Self-pay

## 2021-01-10 NOTE — Telephone Encounter (Signed)
Per 01/06/21 procedure report - Return to GI clinic at appt to be scheduled.  Called and spoke with pt to set up her f/u appt. Pt has been scheduled for a follow up with Dr. Bryan Lemma on Monday, 01/30/21 at 3 pm. Pt is aware that this appt is at the [facility] office. Pt had no concerns at the end of the call.

## 2021-01-11 ENCOUNTER — Telehealth: Payer: Self-pay | Admitting: *Deleted

## 2021-01-11 NOTE — Telephone Encounter (Signed)
°  Follow up Call-  Call back number 01/06/2021  Post procedure Call Back phone  # [phone]  Permission to leave phone message Yes  Some recent data might be hidden     Patient questions:  Do you have a fever, pain , or abdominal swelling? No. Pain Score  0 *  Have you tolerated food without any problems? Yes.    Have you been able to return to your normal activities? Yes.    Do you have any questions about your discharge instructions: Diet   No. Medications  No. Follow up visit  No.  Do you have questions or concerns about your Care? No.  Actions: * If pain score is 4 or above: No action needed, pain <4.

## 2021-01-19 ENCOUNTER — Encounter: Payer: Self-pay | Admitting: Gastroenterology

## 2021-01-30 ENCOUNTER — Other Ambulatory Visit: Payer: Self-pay

## 2021-01-30 ENCOUNTER — Encounter: Payer: Self-pay | Admitting: Gastroenterology

## 2021-01-30 ENCOUNTER — Ambulatory Visit: Payer: Federal, State, Local not specified - PPO | Admitting: Gastroenterology

## 2021-01-30 VITALS — BP 130/88 | HR 97 | Ht 62.0 in | Wt 130.4 lb

## 2021-01-30 DIAGNOSIS — R12 Heartburn: Secondary | ICD-10-CM | POA: Diagnosis not present

## 2021-01-30 DIAGNOSIS — K2100 Gastro-esophageal reflux disease with esophagitis, without bleeding: Secondary | ICD-10-CM

## 2021-01-30 DIAGNOSIS — K449 Diaphragmatic hernia without obstruction or gangrene: Secondary | ICD-10-CM

## 2021-01-30 MED ORDER — SUCRALFATE 1 GM/10ML PO SUSP: 1.0000 g | Freq: Four times a day (QID) | ORAL | 1 refills | Status: DC

## 2021-01-30 NOTE — Patient Instructions (Addendum)
If you are age 64 or younger, your body mass index should be between 19-25. Your There is no height or weight on file to calculate BMI. If this is out of the aformentioned range listed, please consider follow up with your Primary Care Provider.   __________________________________________________________  The [facility] providers would like to encourage you to use [facility] to communicate with providers for non-urgent requests or questions.  Due to long hold times on the telephone, sending your provider a message by [facility] may be a faster and more efficient way to get a response.  Please allow 48 business hours for a response.  Please remember that this is for non-urgent requests.  Due to recent changes in healthcare laws, you may see the results of your imaging and laboratory studies on MyChart before your provider has had a chance to review them.  We understand that in some cases there may be results that are confusing or concerning to you. Not all laboratory results come back in the same time frame and the provider may be waiting for multiple results in order to interpret others.  Please give [place] 48 hours in order for your provider to thoroughly review all the results before contacting the office for clarification of your results.   We have sent the following medications to your pharmacy for you to pick up at your convenience: Carafate  We have placed a referral for you at [facility]. If you do not hear from them within 2 weeks, please give [place] a call at [phone]  You have been scheduled for an Esophageal Manometry at [facility] on 03/01/2021 at 10:30 am. Please arrive 30 minutes prior to your procedure for registration. You will need to go to outpatient registration (1st floor of the hospital) first. Make certain to bring your insurance cards as well as a complete list of medications.  Please remember the following:  1) Do not take any muscle relaxants, xanax  (alprazolam) or ativan for 1 day prior to your test as well as the day of the test.  2) Nothing to eat or drink after 12:00 midnight on the night before your test.  3) Hold all diabetic medications/insulin the morning of the test. You may eat and take your medications after the test.        ------------------------------------------------------------------------------------------- ABOUT ESOPHAGEAL MANOMETRY Esophageal manometry (muh-NOM-uh-tree) is a test that gauges how well your esophagus works. Your esophagus is the long, muscular tube that connects your throat to your stomach. Esophageal manometry measures the rhythmic muscle contractions (peristalsis) that occur in your esophagus when you swallow. Esophageal manometry also measures the coordination and force exerted by the muscles of your esophagus.  During esophageal manometry, a thin, flexible tube (catheter) that contains sensors is passed through your nose, down your esophagus and into your stomach. Esophageal manometry can be helpful in diagnosing some mostly uncommon disorders that affect your esophagus.  Why it's done Esophageal manometry is used to evaluate the movement (motility) of food through the esophagus and into the stomach. The test measures how well the circular bands of muscle (sphincters) at the top and bottom of your esophagus open and close, as well as the pressure, strength and pattern of the wave of esophageal muscle contractions that moves food along.  What you can expect Esophageal manometry is an outpatient procedure done without sedation. Most people tolerate it well. You may be asked to change into a hospital gown before the test starts.  During esophageal  manometry  While you are sitting up, a member of your health care team sprays your throat with a numbing medication or puts numbing gel in your nose or both.  A catheter is guided through your nose into your esophagus. The catheter may be sheathed in a  water-filled sleeve. It doesn't interfere with your breathing. However, your eyes may water, and you may gag. You may have a slight nosebleed from irritation.  After the catheter is in place, you may be asked to lie on your back on an exam table, or you may be asked to remain seated.  You then swallow small sips of water. As you do, a computer connected to the catheter records the pressure, strength and pattern of your esophageal muscle contractions.  During the test, you'll be asked to breathe slowly and smoothly, remain as still as possible, and swallow only when you're asked to do so.  A member of your health care team may move the catheter down into your stomach while the catheter continues its measurements.  The catheter then is slowly withdrawn.  This test typically takes 30-45 minutes to complete.  ---------------------------------------------------------------------------------------------- ABOUT 24 HOUR PH PROBE An esophageal pH test measures and records the pH in your esophagus to determine if you have gastroesophageal reflux disease (GERD). The test can also be done to determine the effectiveness of medications or surgical treatment for GERD. What is esophageal reflux? Esophageal reflux is a condition in which stomach acid refluxes or moves back into the esophagus (the "food pipe" leading from the mouth to the stomach). How does the esophageal pH test work? A thin, small tube with an acid sensing device on the tip is gently passed through your nose, down the esophagus ("food tube"), and positioned about 2 inches above the lower esophageal sphincter. The tube is secured to the side of your face with clear tape. The end of the tube exiting from your nose is attached to a portable recorder that is worn on your belt or over your shoulder. The recorder has several buttons on it that you will press to mark certain events. A nurse will review the monitoring instructions with you. Once the test  has begun, what do I need to know and do? Activity: Follow your usual daily routine. Do not reduce or change your activities during the monitoring period. Doing so can make the monitoring results less useful.  Note: do not take a tub bath or shower; the equipment cant get wet.  Eating: Eat your regular meals at the usual times. If you do not eat during the monitoring period, your stomach will not produce acid as usual, and the test results will not be accurate. Eat at least 2 meals a day. Eat foods that tend to increase your symptoms (without making yourself miserable). Avoid snacking. Do not suck on hard candy or lozenges and do not chew gum during the monitoring period.  Lying down: Remain upright throughout the day. Do not lie down until you go to bed (unless napping or lying down during the day is part of your daily routine).  Medications: Continue to follow your doctors advice regarding medications to avoid during the monitoring period.  Recording symptoms: Press the appropriate button on your recorder when symptoms occur (as discussed with the nurse).  Recording events: Record the time you start and stop eating and drinking (anything other than plain water). Record the time you lie down (even if just resting) and when you get back up. The nurse  will explain this.  Unusual symptoms or side effects. If you think you may be experiencing any unusual symptoms or side effects, call your doctor.  You will return the next day to have the tube removed. The information on the recorder will be downloaded to a computer and the results will be analyzed.  After completion of the study Resume your normal diet and medications. Lozenges or hard candy may help ease any sore throat caused by the tube.   It will take at least 2 weeks to receive the results of this test from your physician.

## 2021-01-30 NOTE — Progress Notes (Signed)
Chief Complaint:    GERD, Heartburn  GI History: 66 year old female initially seen by me on 02/19/2018 for alternating bowel habits, abdominal pain, and reflux symptoms, now s/p Transoral Incisionless Fundoplication (TIF) 69/06/7891.   Longstanding history of GERD, complicated by erosive esophagitis. Index symptoms of heartburn and regurgitation, belch. No dysphagia or odynophagia.  Previous intolerance to omeprazole daily (nausea), so she was only taking intermittently.   EGD in 02/2018 with LA grade A esophagitis, 1 cm hiatal hernia with a Hill grade 2 valve.  Prescribed Prilosec 20 mg p.o. twice daily x8 weeks with good clinical response to therapy, but still with intermittent breakthrough sxs. She eventually tritrated off due to nausea with PPI. Completed TIF on 12/11/2018 with resolution of index reflux symptoms.  Had titrated off all acid suppression therapy.  Started having mild, intermittent heartburn and rare regurgitation in 04/2020.  Symptoms started increasing in 11/2020 with daily heartburn and regurgitation.  Restarted Protonix 40 mg bid with good symptom control.  Repeat upper endoscopy in 12/2020   Endoscopic history: - EGD (12/2020, Dr. Bryan Lemma): LA Grade A esophagitis with single mucosal break, Hill grade 3 valve.  Normal stomach/duodenum. -EGD with Transoral Incisionless Fundoplication (81/0175, Dr. Bryan Lemma): LA Grade A esophagitis, 1 cm HH, 22 fasteners placed with 270 degree valve 3 cm in length -EGD (2020, Dr. Bryan Lemma): 1 cm hiatal hernia, LA grade A esophagitis, Hill grade 2 valve, normal stomach and duodenum. -Colonoscopy (2020, Dr. Bryan Lemma): 3 small, 2 to 3 mm, Hyperplastic polyps, sigmoid diverticulosis, otherwise normal with biopsies negative for [facility].  Recommended repeat in 10 years. -Colonoscopy (2014, Dr. Harl Bowie at [facility]): 5 mm polyp in cecum and 3 mm polyp in transverse colon (path polypoid colonic mucosa with benign reactive lymphoid  hyperplasia), normal TI, sigmoid diverticulosis -Colonoscopy (2012, [place]): No report available for review.  Patient reports polyps  HPI:     Patient is a 66 y.o. female presenting to the Gastroenterology Clinic for follow-up.  Last seen by me on 12/20/2020 for evaluation of breakthrough reflux symptoms.  Was started on Protonix 40 mg bid and Carafate with good clinical response.  Expedited EGD last month with LA Grade A esophagitis and Hill grade 3 valve.  Breakdown was mostly in the posterior part of the valve.  Today, states reflux still present, but certainly better since being on the Protonix and Carafate.  Still taking Protonix 40 mg bid and has reduced Carafate to prn.  Still with only soft foods and liquids.  GERD-HRQL Score: 38/50 (on PPI) and marked "dissatisfied" with current health condition of reflux  She presents to the office with her husband.   Review of systems:     No chest pain, no SOB, no fevers, no urinary sx   Past Medical History:  Diagnosis Date   Allergy    Hayfever   Anxiety    Benign essential hypertension 10/13/2012   Last Assessment & Plan:  Well controlled.     Colitis    Diverticulosis    GERD (gastroesophageal reflux disease)    Headache(784.0)    History of colon polyps    Hypertension    IBS (irritable bowel syndrome)    PONV (postoperative nausea and vomiting)    after ankle surgery in 1994   UC (ulcerative colitis) ([place])     Patient's surgical history, family medical history, social history, medications and allergies were all reviewed in Epic    Current Outpatient Medications  Medication Sig Dispense Refill   buPROPion (  WELLBUTRIN XL) 150 MG 24 hr tablet TAKE 1 TABLET BY MOUTH EVERY DAY 90 tablet 3   diclofenac sodium (VOLTAREN) 1 % GEL Apply 4 g topically 4 (four) times daily. To affected joint. (Patient taking differently: Apply 4 g topically 4 (four) times daily as needed (pain). To affected joint.) 100 g 11   fexofenadine  (ALLEGRA) 180 MG tablet Take 180 mg by mouth daily as needed for allergies or rhinitis.     FIBER PO Take by mouth daily. 1 Tablespoon daily     FLUoxetine (PROZAC) 20 MG tablet TAKE 1 TABLET BY MOUTH EVERY DAY 90 tablet 3   fluticasone (FLONASE) 50 MCG/ACT nasal spray SPRAY 2 SPRAYS INTO EACH NOSTRIL EVERY DAY 48 g 4   hydrochlorothiazide (HYDRODIURIL) 25 MG tablet Take 1 tablet (25 mg total) by mouth daily. 90 tablet 3   ibuprofen (ADVIL,MOTRIN) 200 MG tablet Take 600-800 mg by mouth every 6 (six) hours as needed for moderate pain.     lidocaine-prilocaine (EMLA) cream Apply 1 application topically as needed. 30 g 1   pantoprazole (PROTONIX) 40 MG tablet Take 1 tablet (40 mg total) by mouth 2 (two) times daily. 60 tablet 3   Propylene Glycol (SYSTANE BALANCE) 0.6 % SOLN Place 1 drop into both eyes 2 (two) times daily as needed (dry eyes).     sucralfate (CARAFATE) 1 GM/10ML suspension Take 10 mLs (1 g total) by mouth 4 (four) times daily. 420 mL 1   Current Facility-Administered Medications  Medication Dose Route Frequency Provider Last Rate Last Admin   0.9 %  sodium chloride infusion  500 mL Intravenous Once Mcguire Gasparyan V, DO        Physical Exam:     BP 130/88    Pulse 97    Ht 5' 2"  (1.575 m)    Wt 130 lb 6 oz (59.1 kg)    BMI 23.85 kg/m   GENERAL:  Pleasant female in NAD PSYCH: : Cooperative, normal affect Musculoskeletal:  Normal muscle tone, normal strength NEURO: Alert and oriented x 3, no focal neurologic deficits   IMPRESSION and PLAN:    1) GERD with erosive esophagitis 2) Heartburn 3) LES laxity with suspected hiatal hernia  We reviewed her reflux symptoms and recent endoscopy at length today, to include going over all of the images.  While no significant axial height hernia appreciated, there was significant LES laxity with a Hill grade 3 valve.  I suspect she does have a subtle posterior hiatal hernia.  I additionally discussed her case with one of my TIF  colleagues in [place].  Discussed repeat TIF vs cTIF and both in agreement that cTIF would be the better approach given advanced Hill grade on recent endoscopy.  I discussed surgical options with the patient and her husband at length today, to include  cTIF, Nissen, partial fundoplication, and she would like to proceed with  cTIF.    - Referral to Dr. Redmond Pulling for consideration of concomitant laparoscopic hiatal hernia repair/crural repair and repeat TIF (cTIF)  - Continue Protonix - Please refill for Carafate - Continue antireflux lifestyle/dietary modifications - Esophageal Manometry for pre-operative assessment - Reviewed postoperative dietary and exercise/activity restrictions     I spent 45 minutes of time, including in depth chart review, independent review of results as outlined above, communicating results with the patient directly, face-to-face time with the patient, coordinating care, ordering studies and medications as appropriate, and documentation.   Lavena Bullion ,DO, FACG 01/30/2021, 2:59 PM

## 2021-02-16 DIAGNOSIS — K449 Diaphragmatic hernia without obstruction or gangrene: Secondary | ICD-10-CM | POA: Diagnosis not present

## 2021-02-16 DIAGNOSIS — K219 Gastro-esophageal reflux disease without esophagitis: Secondary | ICD-10-CM | POA: Diagnosis not present

## 2021-02-16 DIAGNOSIS — Z9889 Other specified postprocedural states: Secondary | ICD-10-CM | POA: Diagnosis not present

## 2021-02-17 ENCOUNTER — Encounter: Payer: Self-pay | Admitting: Gastroenterology

## 2021-02-17 ENCOUNTER — Other Ambulatory Visit: Payer: Self-pay | Admitting: Osteopathic Medicine

## 2021-02-17 DIAGNOSIS — Z1231 Encounter for screening mammogram for malignant neoplasm of breast: Secondary | ICD-10-CM

## 2021-02-17 NOTE — Telephone Encounter (Signed)
Spoke with pt to schedule appt with Dr. Bryan Lemma. Pt unable to do appt on 7/58/[birth year] due to conflict she has at 2:54 pm. Scheduled pt for next available day on 03/06/21 at 9:40 am. Pt verbalized understanding and is aware this is a virtual appt. Pt had no other concerns at end of call.

## 2021-02-17 NOTE — Telephone Encounter (Signed)
Absolutely, I would be happy to set this up as a virtual appointment to discuss at length with her.  Please schedule for my next available.  Overbook with me would be ok if nothing available in the next 2 weeks or so.

## 2021-02-21 ENCOUNTER — Other Ambulatory Visit: Payer: Self-pay | Admitting: General Surgery

## 2021-02-21 ENCOUNTER — Encounter (HOSPITAL_COMMUNITY): Payer: Self-pay | Admitting: Gastroenterology

## 2021-02-21 DIAGNOSIS — K219 Gastro-esophageal reflux disease without esophagitis: Secondary | ICD-10-CM

## 2021-02-21 DIAGNOSIS — K449 Diaphragmatic hernia without obstruction or gangrene: Secondary | ICD-10-CM

## 2021-02-21 NOTE — Progress Notes (Signed)
Attempted to obtain medical history via telephone, unable to reach at this time. I left a voicemail to return pre surgical testing department's phone call.  

## 2021-02-27 ENCOUNTER — Ambulatory Visit
Admission: RE | Admit: 2021-02-27 | Discharge: 2021-02-27 | Disposition: A | Discharge status: Home / Self Care | Payer: Federal, State, Local not specified - PPO | Source: Ambulatory Visit | Attending: General Surgery | Admitting: General Surgery

## 2021-02-27 DIAGNOSIS — K449 Diaphragmatic hernia without obstruction or gangrene: Secondary | ICD-10-CM

## 2021-02-27 DIAGNOSIS — K219 Gastro-esophageal reflux disease without esophagitis: Secondary | ICD-10-CM

## 2021-02-28 DIAGNOSIS — Z1231 Encounter for screening mammogram for malignant neoplasm of breast: Secondary | ICD-10-CM

## 2021-03-01 ENCOUNTER — Telehealth: Payer: Self-pay

## 2021-03-01 ENCOUNTER — Encounter (HOSPITAL_COMMUNITY): Payer: Self-pay | Admitting: Gastroenterology

## 2021-03-01 ENCOUNTER — Encounter (HOSPITAL_COMMUNITY)
Admission: RE | Disposition: A | Discharge status: Home / Self Care | Payer: Self-pay | Source: Home / Self Care | Attending: Gastroenterology

## 2021-03-01 ENCOUNTER — Other Ambulatory Visit: Payer: Self-pay

## 2021-03-01 ENCOUNTER — Ambulatory Visit (HOSPITAL_COMMUNITY)
Admission: RE | Admit: 2021-03-01 | Discharge: 2021-03-01 | Disposition: A | Discharge status: Home / Self Care | Payer: Federal, State, Local not specified - PPO | Attending: Gastroenterology | Admitting: Gastroenterology

## 2021-03-01 DIAGNOSIS — K219 Gastro-esophageal reflux disease without esophagitis: Secondary | ICD-10-CM

## 2021-03-01 DIAGNOSIS — Z01818 Encounter for other preprocedural examination: Secondary | ICD-10-CM | POA: Diagnosis not present

## 2021-03-01 DIAGNOSIS — R12 Heartburn: Secondary | ICD-10-CM

## 2021-03-01 DIAGNOSIS — K449 Diaphragmatic hernia without obstruction or gangrene: Secondary | ICD-10-CM | POA: Insufficient documentation

## 2021-03-01 HISTORY — PX: ESOPHAGEAL MANOMETRY: SHX5429

## 2021-03-01 SURGERY — MANOMETRY, ESOPHAGUS

## 2021-03-01 MED ORDER — LIDOCAINE VISCOUS HCL 2 % MT SOLN
OROMUCOSAL | Status: AC
  Filled 2021-03-01: qty 15

## 2021-03-01 SURGICAL SUPPLY — 2 items
FACESHIELD LNG OPTICON STERILE (SAFETY) IMPLANT
GLOVE BIO SURGEON STRL SZ8 (GLOVE) ×4 IMPLANT

## 2021-03-01 NOTE — Telephone Encounter (Signed)
Received a call from [place] at [facility] to coordinate cTIF with Dr. Redmond Pulling. Caryl Pina states that Dr. Redmond Pulling has availability the afternoon of Monday, 05/01/21. Dr. Redmond Pulling would start at 12:30 pm and Dr. Bryan Lemma would follow at 2 pm. I told Caryl Pina that I will check with Dr. Bryan Lemma and get back to her to confirm.   Secure staff message sent to Dr. Bryan Lemma to discuss date.

## 2021-03-01 NOTE — Telephone Encounter (Signed)
Spoke with pt. Let pt know that instructions were sent to her mychart and that she would need a follow up appt 2 weeks after procedure which would be around May 8th. Told pt we would contact her with the appt time once the schedule is released.

## 2021-03-01 NOTE — Telephone Encounter (Signed)
Spoke with [place] at [facility] to confirm appt for 4/24 at [facility]. Hospital orders entered. Will contact pt with instructions for cTIF.

## 2021-03-01 NOTE — Telephone Encounter (Signed)
Cirigliano, Dominic Pea, DO  Yevette Edwards, RN; Marice Potter, RN Sounds like a plan. Will also probably have to block the 11:20 to make sure I can get to [facility] in time to consent prior to the 12:30 start time in OR so I dont delay Anesthesia.   Thanks!

## 2021-03-01 NOTE — Progress Notes (Signed)
Esophageal manometry performed per protocol.  Catheter placed at 50 cm at right nare.  Patient tolerated well.  Report to be sent to Dr. Harl Bowie

## 2021-03-02 ENCOUNTER — Telehealth: Payer: Federal, State, Local not specified - PPO | Admitting: Gastroenterology

## 2021-03-02 ENCOUNTER — Encounter (HOSPITAL_COMMUNITY): Payer: Self-pay | Admitting: Gastroenterology

## 2021-03-06 ENCOUNTER — Encounter: Payer: Self-pay | Admitting: Gastroenterology

## 2021-03-06 ENCOUNTER — Telehealth (INDEPENDENT_AMBULATORY_CARE_PROVIDER_SITE_OTHER): Payer: Federal, State, Local not specified - PPO | Admitting: Gastroenterology

## 2021-03-06 VITALS — Ht 62.0 in | Wt 125.0 lb

## 2021-03-06 DIAGNOSIS — Z9889 Other specified postprocedural states: Secondary | ICD-10-CM

## 2021-03-06 DIAGNOSIS — K219 Gastro-esophageal reflux disease without esophagitis: Secondary | ICD-10-CM | POA: Diagnosis not present

## 2021-03-06 DIAGNOSIS — K2100 Gastro-esophageal reflux disease with esophagitis, without bleeding: Secondary | ICD-10-CM | POA: Diagnosis not present

## 2021-03-06 DIAGNOSIS — K449 Diaphragmatic hernia without obstruction or gangrene: Secondary | ICD-10-CM | POA: Diagnosis not present

## 2021-03-06 NOTE — Progress Notes (Signed)
Chief Complaint: GERD, hiatal hernia, questions about upcoming surgery  GI history: 66 year old female initially seen by me on 02/19/2018 for alternating bowel habits, abdominal pain, and reflux symptoms, now s/p Transoral Incisionless Fundoplication (TIF) 51/08/8414.   Longstanding history of GERD, complicated by erosive esophagitis. Index symptoms of heartburn and regurgitation, belch. No dysphagia or odynophagia.  Previous intolerance to omeprazole daily (nausea), so she was only taking intermittently.   EGD in 02/2018 with LA grade A esophagitis, 1 cm hiatal hernia with a Hill grade 2 valve.  Prescribed Prilosec 20 mg p.o. twice daily x8 weeks with good clinical response to therapy, but still with intermittent breakthrough sxs. She eventually tritrated off due to nausea with PPI. Completed TIF on 12/11/2018 with resolution of index reflux symptoms.  Had titrated off all acid suppression therapy.   Started having mild, intermittent heartburn and rare regurgitation in 04/2020.  Symptoms started increasing in 11/2020 with daily heartburn and regurgitation.  Restarted Protonix 40 mg bid with good symptom control.  Repeat upper endoscopy in 12/2020   Endoscopic history: - EGD (12/2020, Dr. Bryan Lemma): LA Grade A esophagitis with single mucosal break, Hill grade 3 valve.  Normal stomach/duodenum. -EGD with Transoral Incisionless Fundoplication (60/6301, Dr. Bryan Lemma): LA Grade A esophagitis, 1 cm HH, 22 fasteners placed with 270 degree valve 3 cm in length -EGD (2020, Dr. Bryan Lemma): 1 cm hiatal hernia, LA grade A esophagitis, Hill grade 2 valve, normal stomach and duodenum. -Colonoscopy (2020, Dr. Bryan Lemma): 3 small, 2 to 3 mm, Hyperplastic polyps, sigmoid diverticulosis, otherwise normal with biopsies negative for [facility].  Recommended repeat in 10 years. -Colonoscopy (2014, Dr. Harl Bowie at [facility]): 5 mm polyp in cecum and 3 mm polyp in transverse colon (path polypoid  colonic mucosa with benign reactive lymphoid hyperplasia), normal TI, sigmoid diverticulosis -Colonoscopy (2012, [place]): No report available for review.  Patient reports polyps    HPI:    Due to current restrictions/limitations of in-office visits due to the COVID-19 pandemic, this scheduled clinical appointment was converted to a telehealth virtual consultation using MyChart video  -Time of medical discussion: 10 minutes -The patient did consent to this virtual visit and is aware of possible charges through their insurance for this visit.  -Names of all parties present: Kristen Nichols (patient), Gerrit Heck, DO, [facility] (physician) -Patient location: Home -Physician location: Office  Kristen Nichols is a 66 y.o. female presenting to the Gastroenterology Clinic for follow-up and discussion of upcoming concomitant laparoscopic hiatal hernia repair and TIF, scheduled for 05/01/2021.  Last seen by me on 01/30/2021 with breakthrough reflux symptoms and LA Grade A esophagitis and Hill grade 3 valve noted on repeat EGD in 12/2021, as outlined above.  At the time of her last appointment, reflux symptoms were improving since starting high-dose Protonix and Carafate.  Had reduced Carafate down to prn only.  She had an appointment with Dr. Redmond Pulling last week with plan for cTIF in April as above.  She scheduled appointment today to discuss her upcoming surgery.  She is otherwise without change in symptoms.  Continues to take Protonix as prescribed.  Still with a modified soft diet.  Completed Esophageal Manometry last week; results pending.  Past medical history, past surgical history, social history, family history, medications, and allergies reviewed in the chart and with patient.    Past Medical History:  Diagnosis Date   Allergy    Hayfever   Anxiety  Benign essential hypertension 10/13/2012   Last Assessment & Plan:  Well controlled.     Colitis    Diverticulosis    GERD  (gastroesophageal reflux disease)    Headache(784.0)    History of colon polyps    Hypertension    IBS (irritable bowel syndrome)    PONV (postoperative nausea and vomiting)    after ankle surgery in 1994   UC (ulcerative colitis) ([place])      Past Surgical History:  Procedure Laterality Date   ANKLE SURGERY Left    pin in place    BREAST BIOPSY Right 2017   marker in place    BREAST CYST ASPIRATION Right 2016   CESAREAN SECTION     x2   COLONOSCOPY     has had 2 or 3 of them per patient. Last one was around 2016 with [facility] or maybe [facility] , last february 2020 now toal of 4    ESOPHAGEAL MANOMETRY N/A 03/01/2021   Procedure: ESOPHAGEAL MANOMETRY (EM);  Surgeon: Lavena Bullion, DO;  Location: [facility];  Service: Gastroenterology;  Laterality: N/A;   ESOPHAGOGASTRODUODENOSCOPY (EGD) WITH PROPOFOL N/A 12/11/2018   Procedure: ESOPHAGOGASTRODUODENOSCOPY (EGD) WITH PROPOFOL;  Surgeon: Lavena Bullion, DO;  Location: [facility];  Service: Gastroenterology;  Laterality: N/A;   MALONEY DILATION  12/11/2018   Procedure: MALONEY DILATION;  Surgeon: Lavena Bullion, DO;  Location: [facility];  Service: Gastroenterology;;   NASAL SINUS SURGERY     TRANSORAL INCISIONLESS FUNDOPLICATION N/A 62/08/3660   Procedure: TRANSORAL INCISIONLESS FUNDOPLICATION;  Surgeon: Lavena Bullion, DO;  Location: [facility];  Service: Gastroenterology;  Laterality: N/A;   Family History  Problem Relation Age of Onset   Other Mother    Other Father    Cancer Father        small cell lung cancer-limited    Diabetes Maternal Grandmother    Colon cancer Neg Hx    Esophageal cancer Neg Hx    Social History   Tobacco Use   Smoking status: Former    Packs/day: 0.50    Years: 20.00    Pack years: 10.00    Types: Cigarettes    Quit date: 01/09/2016    Years since quitting: 5.1   Smokeless tobacco: Never  Vaping Use   Vaping Use: Former  Substance Use Topics   Alcohol use: Yes     Alcohol/week: 2.0 standard drinks    Types: 2 Cans of beer per week    Comment: 3-4 alcoholic drinks weekly    Drug use: No   Current Outpatient Medications  Medication Sig Dispense Refill   buPROPion (WELLBUTRIN XL) 150 MG 24 hr tablet TAKE 1 TABLET BY MOUTH EVERY DAY 90 tablet 3   diclofenac sodium (VOLTAREN) 1 % GEL Apply 4 g topically 4 (four) times daily. To affected joint. (Patient taking differently: Apply 4 g topically 4 (four) times daily as needed (pain). To affected joint.) 100 g 11   fexofenadine (ALLEGRA) 180 MG tablet Take 180 mg by mouth daily as needed for allergies or rhinitis.     FIBER PO Take by mouth daily. 1 Tablespoon daily     FLUoxetine (PROZAC) 20 MG tablet TAKE 1 TABLET BY MOUTH EVERY DAY 90 tablet 3   fluticasone (FLONASE) 50 MCG/ACT nasal spray SPRAY 2 SPRAYS INTO EACH NOSTRIL EVERY DAY (Patient taking differently: 2 sprays as needed. SPRAY 2 SPRAYS INTO EACH NOSTRIL EVERY DAY) 48 g 4   hydrochlorothiazide (HYDRODIURIL) 25 MG tablet Take 1  tablet (25 mg total) by mouth daily. 90 tablet 3   ibuprofen (ADVIL,MOTRIN) 200 MG tablet Take 600-800 mg by mouth every 6 (six) hours as needed for moderate pain.     lidocaine-prilocaine (EMLA) cream Apply 1 application topically as needed. 30 g 1   pantoprazole (PROTONIX) 40 MG tablet Take 1 tablet (40 mg total) by mouth 2 (two) times daily. 60 tablet 3   Propylene Glycol (SYSTANE BALANCE) 0.6 % SOLN Place 1 drop into both eyes 2 (two) times daily as needed (dry eyes).     sucralfate (CARAFATE) 1 GM/10ML suspension Take 10 mLs (1 g total) by mouth 4 (four) times daily. 420 mL 1   Current Facility-Administered Medications  Medication Dose Route Frequency Provider Last Rate Last Admin   0.9 %  sodium chloride infusion  500 mL Intravenous Once Lougenia Morrissey V, DO       No Known Allergies   Review of Systems: All systems reviewed and negative except where noted in HPI.     Physical Exam:    Complete physical exam not  completed due to the nature of this telehealth communication.   Gen: Awake, alert, and oriented, and well communicative. Neck: Normal movement of head and neck Pulm: No labored breathing, speaking in full sentences without conversational dyspnea Derm: No apparent lesions or bruising in visible field MS: Moves all visible extremities without noticeable abnormality Psych: Pleasant, cooperative, normal speech, thought processing seemingly intact   ASSESSMENT AND PLAN;   1) GERD with esophagitis 2) Hiatal hernia 3) LES laxity - We will follow-up on pending Esophageal Manometry results for preoperative assessment - Continue Protonix - Continue antireflux lifestyle/dietary modifications - Plan to proceed with cTIF on 05/01/2021 - We again discussed postoperative dietary and exercise/activity restrictions - I answered all questions to the best of my ability and she was appreciative in the virtual appointment today   Lavena Bullion, DO, FACG  03/06/2021, 9:44 AM   Emeterio Reeve, DO

## 2021-03-06 NOTE — Patient Instructions (Signed)
If you are age 64 or older, your body mass index should be between 23-30. Your There is no height or weight on file to calculate BMI. If this is out of the aforementioned range listed, please consider follow up with your Primary Care Provider.  If you are age 70 or younger, your body mass index should be between 19-25. Your There is no height or weight on file to calculate BMI. If this is out of the aformentioned range listed, please consider follow up with your Primary Care Provider.   ________________________________________________________  The [place] [facility] providers would like to encourage you to use [facility] to communicate with providers for non-urgent requests or questions.  Due to long hold times on the telephone, sending your provider a message by [facility] may be a faster and more efficient way to get a response.  Please allow 48 business hours for a response.  Please remember that this is for non-urgent requests.  _______________________________________________________  Please call with any questions or concerns.  It was a pleasure to see you today!  Vito Cirigliano, D.O.

## 2021-03-13 ENCOUNTER — Other Ambulatory Visit: Payer: Self-pay

## 2021-03-22 ENCOUNTER — Telehealth: Payer: Self-pay

## 2021-03-22 NOTE — Telephone Encounter (Signed)
Pt is scheduled for 2 week follow up on 05/15/21 at 9:00 am. Pt verbalized understanding and had no other concerns at end of call.  ?

## 2021-03-22 NOTE — Telephone Encounter (Signed)
-----   Message from Marice Potter, RN sent at 03/01/2021 11:18 AM EST ----- ?Regarding: appt ?Pt needs to be scheduled for a 2 week post tif follow up on 05/15/21 with Dr. Bryan Lemma. ? ?

## 2021-03-29 ENCOUNTER — Other Ambulatory Visit: Payer: Self-pay | Admitting: Osteopathic Medicine

## 2021-03-30 ENCOUNTER — Other Ambulatory Visit: Payer: Self-pay | Admitting: Osteopathic Medicine

## 2021-04-07 NOTE — Progress Notes (Signed)
Surgery orders requested via Epic inbox. °

## 2021-04-10 ENCOUNTER — Ambulatory Visit: Payer: Self-pay | Admitting: General Surgery

## 2021-04-10 DIAGNOSIS — K449 Diaphragmatic hernia without obstruction or gangrene: Secondary | ICD-10-CM

## 2021-04-10 NOTE — Progress Notes (Addendum)
Anesthesia Review: ? ?PCP: [facility]  ?Cardiologist : none  ?Chest x-ray : ?EKG :04/12/21  ?Echo : ?Stress test: ?Cardiac Cath :  ?Activity level: can do ao flgiht of stiars without difficulty  ?Sleep Study/ CPAP : none  ?Fasting Blood Sugar :      / Checks Blood Sugar -- times a day:   ?Blood Thinner/ Instructions /Last Dose: ?ASA / Instructions/ Last Dose :   ?

## 2021-04-10 NOTE — Progress Notes (Addendum)
DUE TO COVID-19 ONLY ONE VISITOR IS ALLOWED TO COME WITH YOU AND STAY IN THE WAITING ROOM ONLY DURING PRE OP AND PROCEDURE DAY OF SURGERY.  2 VISITOR  MAY VISIT WITH YOU AFTER SURGERY IN YOUR PRIVATE ROOM DURING VISITING HOURS ONLY! ?YOU MAY HAVE ONE PERSON SPEND THE NITE WITH YOU IN YOUR ROOM AFTER SURGERY.   ? ?  ? ? Your procedure is scheduled on:  ?    05/01/21  ? Report to [facility] Main  Entrance ? ? Report to admitting at   1015             AM ?DO NOT BRING INSURANCE CARD, PICTURE ID OR WALLET DAY OF SURGERY.  ?  ? ? Call this number if you have problems the morning of surgery [phone]  ? ? REMEMBER: NO  SOLID FOODS , CANDY, GUM OR MINTS AFTER MIDNITE THE NITE BEFORE SURGERY .       Mar[facility] Kitchen CLEAR LIQUIDS UNTIL    0930am            DAY OF SURGERY.      PLEASE FINISH ENSURE DRINK PER SURGEON ORDER  WHICH NEEDS TO BE COMPLETED AT  0930am        MORNING OF SURGERY.   ? ? ? ? ?CLEAR LIQUID DIET ? ? ?Foods Allowed      ?WATER ?BLACK COFFEE ( SUGAR OK, NO MILK, CREAM OR CREAMER) REGULAR AND DECAF  ?TEA ( SUGAR OK NO MILK, CREAM, OR CREAMER) REGULAR AND DECAF  ?PLAIN JELLO ( NO RED)  ?FRUIT ICES ( NO RED, NO FRUIT PULP)  ?POPSICLES ( NO RED)  ?JUICE- APPLE, WHITE GRAPE AND WHITE CRANBERRY  ?SPORT DRINK LIKE GATORADE ( NO RED)  ?CLEAR BROTH ( VEGETABLE , CHICKEN OR BEEF)                                                               ? ?    ? ?BRUSH YOUR TEETH MORNING OF SURGERY AND RINSE YOUR MOUTH OUT, NO CHEWING GUM CANDY OR MINTS. ?  ? ? Take these medicines the morning of surgery with A SIP OF WATER:  wellbutrin, protonix, prozac, flonase  ? ? ?DO NOT TAKE ANY DIABETIC MEDICATIONS DAY OF YOUR SURGERY ?                  ?            You may not have any metal on your body including hair pins and  ?            piercings  Do not wear jewelry, make-up, lotions, powders or perfumes, deodorant ?            Do not wear nail polish on your fingernails.   ?           IF YOU ARE A FEMALE AND WANT TO SHAVE UNDER ARMS  OR LEGS PRIOR TO SURGERY YOU MUST DO SO AT LEAST 48 HOURS PRIOR TO SURGERY.  ?            Men may shave face and neck. ? ? Do not bring valuables to the hospital. [facility] NOT ?  RESPONSIBLE   FOR VALUABLES. ? Contacts, dentures or bridgework may not be worn into surgery. ? Leave suitcase in the car. After surgery it may be brought to your room. ? ?  ? Patients discharged the day of surgery will not be allowed to drive home. IF YOU ARE HAVING SURGERY AND GOING HOME THE SAME DAY, YOU MUST HAVE AN ADULT TO DRIVE YOU HOME AND BE WITH YOU FOR 24 HOURS. YOU MAY GO HOME BY TAXI OR UBER OR ORTHERWISE, BUT AN ADULT MUST ACCOMPANY YOU HOME AND STAY WITH YOU FOR 24 HOURS. ?  ? ?            Please read over the following fact sheets you were given: ?_____________________________________________________________________ ? ?[facility] - Preparing for Surgery ?Before surgery, you can play an important role.  Because skin is not sterile, your skin needs to be as free of germs as possible.  You can reduce the number of germs on your skin by washing with CHG (chlorahexidine gluconate) soap before surgery.  CHG is an antiseptic cleaner which kills germs and bonds with the skin to continue killing germs even after washing. ?Please DO NOT use if you have an allergy to CHG or antibacterial soaps.  If your skin becomes reddened/irritated stop using the CHG and inform your nurse when you arrive at Short Stay. ?Do not shave (including legs and underarms) for at least 48 hours prior to the first CHG shower.  You may shave your face/neck. ?Please follow these instructions carefully: ? 1.  Shower with CHG Soap the night before surgery and the  morning of Surgery. ? 2.  If you choose to wash your hair, wash your hair first as usual with your  normal  shampoo. ? 3.  After you shampoo, rinse your hair and body thoroughly to remove the  shampoo.                           4.  Use CHG as you would any other liquid soap.  You can  apply chg directly  to the skin and wash  ?                     Gently with a scrungie or clean washcloth. ? 5.  Apply the CHG Soap to your body ONLY FROM THE NECK DOWN.   Do not use on face/ open      ?                     Wound or open sores. Avoid contact with eyes, ears mouth and genitals (private parts).  ?                     Production manager,  Genitals (private parts) with your normal soap. ?            6.  Wash thoroughly, paying special attention to the area where your surgery  will be performed. ? 7.  Thoroughly rinse your body with warm water from the neck down. ? 8.  DO NOT shower/wash with your normal soap after using and rinsing off  the CHG Soap. ?               9.  Pat yourself dry with a clean towel. ?           10.  Wear clean pajamas. ?  11.  Place clean sheets on your bed the night of your first shower and do not  sleep with pets. ?Day of Surgery : ?Do not apply any lotions/deodorants the morning of surgery.  Please wear clean clothes to the hospital/surgery center. ? ?FAILURE TO FOLLOW THESE INSTRUCTIONS MAY RESULT IN THE CANCELLATION OF YOUR SURGERY ?PATIENT SIGNATURE_________________________________ ? ?NURSE SIGNATURE__________________________________ ? ?________________________________________________________________________  ? ? ?           ?

## 2021-04-12 ENCOUNTER — Encounter (HOSPITAL_COMMUNITY): Payer: Self-pay

## 2021-04-12 ENCOUNTER — Other Ambulatory Visit: Payer: Self-pay

## 2021-04-12 ENCOUNTER — Encounter (HOSPITAL_COMMUNITY)
Admission: RE | Admit: 2021-04-12 | Discharge: 2021-04-12 | Disposition: A | Discharge status: Home / Self Care | Payer: Federal, State, Local not specified - PPO | Source: Ambulatory Visit | Attending: General Surgery | Admitting: General Surgery

## 2021-04-12 DIAGNOSIS — K449 Diaphragmatic hernia without obstruction or gangrene: Secondary | ICD-10-CM | POA: Insufficient documentation

## 2021-04-12 DIAGNOSIS — Z01818 Encounter for other preprocedural examination: Secondary | ICD-10-CM | POA: Insufficient documentation

## 2021-04-12 LAB — CBC WITH DIFFERENTIAL/PLATELET
Abs Immature Granulocytes: 0.02 10*3/uL (ref 0.00–0.07)
Basophils Absolute: 0 10*3/uL (ref 0.0–0.1)
Basophils Relative: 1 %
Eosinophils Absolute: 0.1 10*3/uL (ref 0.0–0.5)
Eosinophils Relative: 1 %
HCT: 42.9 % (ref 36.0–46.0)
Hemoglobin: 14.8 g/dL (ref 12.0–15.0)
Immature Granulocytes: 0 %
Lymphocytes Relative: 37 %
Lymphs Abs: 2.6 10*3/uL (ref 0.7–4.0)
MCH: 31.2 pg (ref 26.0–34.0)
MCHC: 34.5 g/dL (ref 30.0–36.0)
MCV: 90.5 fL (ref 80.0–100.0)
Monocytes Absolute: 0.6 10*3/uL (ref 0.1–1.0)
Monocytes Relative: 9 %
Neutro Abs: 3.8 10*3/uL (ref 1.7–7.7)
Neutrophils Relative %: 52 %
Platelets: 320 10*3/uL (ref 150–400)
RBC: 4.74 MIL/uL (ref 3.87–5.11)
RDW: 11.9 % (ref 11.5–15.5)
WBC: 7.2 10*3/uL (ref 4.0–10.5)
nRBC: 0 % (ref 0.0–0.2)

## 2021-04-12 LAB — COMPREHENSIVE METABOLIC PANEL
ALT: 20 U/L (ref 0–44)
AST: 22 U/L (ref 15–41)
Albumin: 4.5 g/dL (ref 3.5–5.0)
Alkaline Phosphatase: 71 U/L (ref 38–126)
Anion gap: 7 (ref 5–15)
BUN: 7 mg/dL — ABNORMAL LOW (ref 8–23)
CO2: 31 mmol/L (ref 22–32)
Calcium: 9.8 mg/dL (ref 8.9–10.3)
Chloride: 101 mmol/L (ref 98–111)
Creatinine, Ser: 0.78 mg/dL (ref 0.44–1.00)
GFR, Estimated: >60 mL/min (ref >60)
Glucose, Bld: 84 mg/dL (ref 70–99)
Potassium: 4.5 mmol/L (ref 3.5–5.1)
Sodium: 139 mmol/L (ref 135–145)
Total Bilirubin: 0.4 mg/dL (ref 0.3–1.2)
Total Protein: 7.3 g/dL (ref 6.5–8.1)

## 2021-04-14 DIAGNOSIS — Z1231 Encounter for screening mammogram for malignant neoplasm of breast: Secondary | ICD-10-CM

## 2021-05-01 ENCOUNTER — Encounter (HOSPITAL_COMMUNITY): Payer: Self-pay | Admitting: General Surgery

## 2021-05-01 ENCOUNTER — Encounter (HOSPITAL_COMMUNITY)
Admission: RE | Disposition: A | Discharge status: Home / Self Care | Payer: Self-pay | Source: Ambulatory Visit | Attending: Gastroenterology

## 2021-05-01 ENCOUNTER — Other Ambulatory Visit: Payer: Self-pay

## 2021-05-01 ENCOUNTER — Ambulatory Visit (HOSPITAL_COMMUNITY): Payer: Federal, State, Local not specified - PPO | Admitting: Physician Assistant

## 2021-05-01 ENCOUNTER — Observation Stay (HOSPITAL_COMMUNITY)
Admission: RE | Admit: 2021-05-01 | Discharge: 2021-05-02 | Disposition: A | Discharge status: Home / Self Care | Payer: Federal, State, Local not specified - PPO | Source: Ambulatory Visit | Attending: Gastroenterology | Admitting: Gastroenterology

## 2021-05-01 ENCOUNTER — Ambulatory Visit (HOSPITAL_COMMUNITY): Payer: Federal, State, Local not specified - PPO | Admitting: Anesthesiology

## 2021-05-01 DIAGNOSIS — I10 Essential (primary) hypertension: Secondary | ICD-10-CM | POA: Insufficient documentation

## 2021-05-01 DIAGNOSIS — Z87891 Personal history of nicotine dependence: Secondary | ICD-10-CM | POA: Insufficient documentation

## 2021-05-01 DIAGNOSIS — Z8719 Personal history of other diseases of the digestive system: Secondary | ICD-10-CM | POA: Insufficient documentation

## 2021-05-01 DIAGNOSIS — K449 Diaphragmatic hernia without obstruction or gangrene: Principal | ICD-10-CM

## 2021-05-01 DIAGNOSIS — K2100 Gastro-esophageal reflux disease with esophagitis, without bleeding: Secondary | ICD-10-CM | POA: Diagnosis not present

## 2021-05-01 DIAGNOSIS — K219 Gastro-esophageal reflux disease without esophagitis: Secondary | ICD-10-CM | POA: Insufficient documentation

## 2021-05-01 DIAGNOSIS — R12 Heartburn: Secondary | ICD-10-CM

## 2021-05-01 HISTORY — PX: SAVORY DILATION: SHX5439

## 2021-05-01 HISTORY — PX: HIATAL HERNIA REPAIR: SHX195

## 2021-05-01 HISTORY — PX: TRANSORAL INCISIONLESS FUNDOPLICATION: SHX6840

## 2021-05-01 HISTORY — PX: ESOPHAGOGASTRODUODENOSCOPY: SHX5428

## 2021-05-01 SURGERY — REPAIR, HERNIA, HIATAL, LAPAROSCOPIC
Anesthesia: General

## 2021-05-01 MED ORDER — ACETAMINOPHEN 325 MG PO TABS: 325.0000 mg | ORAL_TABLET | ORAL | Status: DC | PRN

## 2021-05-01 MED ORDER — ACETAMINOPHEN 500 MG PO TABS
1000.0000 mg | ORAL_TABLET | ORAL | Status: AC
  Administered 2021-05-01: 1000 mg via ORAL
  Filled 2021-05-01: qty 2

## 2021-05-01 MED ORDER — CHLORHEXIDINE GLUCONATE 0.12 % MT SOLN
15.0000 mL | Freq: Once | OROMUCOSAL | Status: AC
  Administered 2021-05-01: 15 mL via OROMUCOSAL

## 2021-05-01 MED ORDER — FENTANYL CITRATE PF 50 MCG/ML IJ SOSY
PREFILLED_SYRINGE | INTRAMUSCULAR | Status: AC
  Administered 2021-05-01: 50 ug via INTRAVENOUS
  Filled 2021-05-01: qty 2

## 2021-05-01 MED ORDER — CEFAZOLIN SODIUM-DEXTROSE 2-4 GM/100ML-% IV SOLN
2.0000 g | INTRAVENOUS | Status: AC
  Administered 2021-05-01: 2 g via INTRAVENOUS
  Filled 2021-05-01: qty 100

## 2021-05-01 MED ORDER — FENTANYL CITRATE (PF) 250 MCG/5ML IJ SOLN
INTRAMUSCULAR | Status: AC
Start: 2021-05-01 — End: ?
  Filled 2021-05-01: qty 5

## 2021-05-01 MED ORDER — DEXMEDETOMIDINE (PRECEDEX) IN NS 20 MCG/5ML (4 MCG/ML) IV SYRINGE
PREFILLED_SYRINGE | INTRAVENOUS | Status: DC | PRN
  Administered 2021-05-01: 8 ug via INTRAVENOUS

## 2021-05-01 MED ORDER — SIMETHICONE 80 MG PO CHEW
80.0000 mg | CHEWABLE_TABLET | Freq: Four times a day (QID) | ORAL | Status: DC | PRN
Start: 2021-05-01 — End: 2021-05-02
  Administered 2021-05-01: 80 mg via ORAL
  Filled 2021-05-01: qty 1

## 2021-05-01 MED ORDER — PROPOFOL 10 MG/ML IV BOLUS
INTRAVENOUS | Status: DC | PRN
  Administered 2021-05-01: 150 mg via INTRAVENOUS

## 2021-05-01 MED ORDER — METOCLOPRAMIDE HCL 5 MG/ML IJ SOLN
10.0000 mg | Freq: Four times a day (QID) | INTRAMUSCULAR | Status: DC
  Administered 2021-05-01 – 2021-05-02 (×3): 10 mg via INTRAVENOUS
  Filled 2021-05-01 (×3): qty 2

## 2021-05-01 MED ORDER — MIDAZOLAM HCL 2 MG/2ML IJ SOLN
INTRAMUSCULAR | Status: AC
  Filled 2021-05-01: qty 2

## 2021-05-01 MED ORDER — LIDOCAINE HCL (PF) 2 % IJ SOLN
INTRAMUSCULAR | Status: AC
  Filled 2021-05-01: qty 5

## 2021-05-01 MED ORDER — ACETAMINOPHEN-CODEINE 120-12 MG/5ML PO SOLN
15.0000 mL | ORAL | Status: DC | PRN
  Administered 2021-05-01 – 2021-05-02 (×3): 15 mL via ORAL
  Filled 2021-05-01 (×3): qty 20

## 2021-05-01 MED ORDER — OXYCODONE HCL 5 MG PO TABS: 5.0000 mg | ORAL_TABLET | Freq: Once | ORAL | Status: DC | PRN

## 2021-05-01 MED ORDER — MIDAZOLAM HCL 5 MG/5ML IJ SOLN
INTRAMUSCULAR | Status: DC | PRN
  Administered 2021-05-01: 2 mg via INTRAVENOUS

## 2021-05-01 MED ORDER — PHENYLEPHRINE HCL-NACL 20-0.9 MG/250ML-% IV SOLN
INTRAVENOUS | Status: DC | PRN
  Administered 2021-05-01: 35 ug/min via INTRAVENOUS

## 2021-05-01 MED ORDER — FAMOTIDINE IN NACL 20-0.9 MG/50ML-% IV SOLN
20.0000 mg | Freq: Once | INTRAVENOUS | Status: AC
  Administered 2021-05-01: 20 mg via INTRAVENOUS
  Filled 2021-05-01 (×2): qty 50

## 2021-05-01 MED ORDER — SODIUM CHLORIDE 0.9 % IV SOLN: INTRAVENOUS | Status: DC

## 2021-05-01 MED ORDER — DEXAMETHASONE SODIUM PHOSPHATE 10 MG/ML IJ SOLN
8.0000 mg | Freq: Four times a day (QID) | INTRAMUSCULAR | Status: DC
  Administered 2021-05-01 – 2021-05-02 (×3): 8 mg via INTRAVENOUS
  Filled 2021-05-01 (×3): qty 1

## 2021-05-01 MED ORDER — BUPIVACAINE-EPINEPHRINE (PF) 0.25% -1:200000 IJ SOLN
INTRAMUSCULAR | Status: AC
  Filled 2021-05-01: qty 30

## 2021-05-01 MED ORDER — FENTANYL CITRATE PF 50 MCG/ML IJ SOSY
25.0000 ug | PREFILLED_SYRINGE | INTRAMUSCULAR | Status: DC | PRN
  Administered 2021-05-01: 50 ug via INTRAVENOUS

## 2021-05-01 MED ORDER — BUPIVACAINE LIPOSOME 1.3 % IJ SUSP: 20.0000 mL | Freq: Once | INTRAMUSCULAR | Status: DC

## 2021-05-01 MED ORDER — 0.9 % SODIUM CHLORIDE (POUR BTL) OPTIME
TOPICAL | Status: DC | PRN
  Administered 2021-05-01: 1000 mL

## 2021-05-01 MED ORDER — BUPIVACAINE-EPINEPHRINE 0.25% -1:200000 IJ SOLN
INTRAMUSCULAR | Status: DC | PRN
  Administered 2021-05-01: 30 mL

## 2021-05-01 MED ORDER — CHLORHEXIDINE GLUCONATE CLOTH 2 % EX PADS: 6.0000 | MEDICATED_PAD | Freq: Once | CUTANEOUS | Status: DC

## 2021-05-01 MED ORDER — LIDOCAINE VISCOUS HCL 2 % MT SOLN
5.0000 mL | Freq: Three times a day (TID) | OROMUCOSAL | Status: DC | PRN
  Filled 2021-05-01: qty 15

## 2021-05-01 MED ORDER — ONDANSETRON HCL 4 MG/2ML IJ SOLN
4.0000 mg | Freq: Once | INTRAMUSCULAR | Status: AC
  Administered 2021-05-01: 4 mg via INTRAVENOUS
  Filled 2021-05-01: qty 2

## 2021-05-01 MED ORDER — LACTATED RINGERS IR SOLN
Status: DC | PRN
  Administered 2021-05-01: 1000 mL

## 2021-05-01 MED ORDER — LIDOCAINE 2% (20 MG/ML) 5 ML SYRINGE
INTRAMUSCULAR | Status: DC | PRN
  Administered 2021-05-01: 1.5 mg/kg/h via INTRAVENOUS

## 2021-05-01 MED ORDER — PROPOFOL 1000 MG/100ML IV EMUL
INTRAVENOUS | Status: AC
  Filled 2021-05-01: qty 100

## 2021-05-01 MED ORDER — BUPIVACAINE LIPOSOME 1.3 % IJ SUSP
INTRAMUSCULAR | Status: AC
  Filled 2021-05-01: qty 20

## 2021-05-01 MED ORDER — FENTANYL CITRATE (PF) 250 MCG/5ML IJ SOLN
INTRAMUSCULAR | Status: DC | PRN
Start: 2021-05-01 — End: 2021-05-01
  Administered 2021-05-01 (×5): 50 ug via INTRAVENOUS

## 2021-05-01 MED ORDER — ROCURONIUM BROMIDE 10 MG/ML (PF) SYRINGE
PREFILLED_SYRINGE | INTRAVENOUS | Status: DC | PRN
Start: 2021-05-01 — End: 2021-05-01
  Administered 2021-05-01: 80 mg via INTRAVENOUS
  Administered 2021-05-01: 20 mg via INTRAVENOUS

## 2021-05-01 MED ORDER — PANTOPRAZOLE SODIUM 40 MG PO TBEC
40.0000 mg | DELAYED_RELEASE_TABLET | Freq: Two times a day (BID) | ORAL | Status: DC
  Administered 2021-05-01 – 2021-05-02 (×2): 40 mg via ORAL
  Filled 2021-05-01 (×2): qty 1

## 2021-05-01 MED ORDER — PROPOFOL 10 MG/ML IV BOLUS
INTRAVENOUS | Status: AC
  Filled 2021-05-01: qty 20

## 2021-05-01 MED ORDER — DEXAMETHASONE SODIUM PHOSPHATE 10 MG/ML IJ SOLN
INTRAMUSCULAR | Status: AC
  Filled 2021-05-01: qty 1

## 2021-05-01 MED ORDER — ORAL CARE MOUTH RINSE: 15.0000 mL | Freq: Once | OROMUCOSAL | Status: AC

## 2021-05-01 MED ORDER — SCOPOLAMINE 1 MG/3DAYS TD PT72: 1.0000 | MEDICATED_PATCH | Freq: Once | TRANSDERMAL | Status: DC

## 2021-05-01 MED ORDER — ONDANSETRON HCL 4 MG/2ML IJ SOLN
INTRAMUSCULAR | Status: AC
  Filled 2021-05-01: qty 2

## 2021-05-01 MED ORDER — ENSURE PRE-SURGERY PO LIQD
296.0000 mL | Freq: Once | ORAL | Status: DC
  Filled 2021-05-01: qty 296

## 2021-05-01 MED ORDER — DEXMEDETOMIDINE (PRECEDEX) IN NS 20 MCG/5ML (4 MCG/ML) IV SYRINGE
PREFILLED_SYRINGE | INTRAVENOUS | Status: AC
  Filled 2021-05-01: qty 5

## 2021-05-01 MED ORDER — DEXMEDETOMIDINE (PRECEDEX) IN NS 20 MCG/5ML (4 MCG/ML) IV SYRINGE: PREFILLED_SYRINGE | INTRAVENOUS | Status: DC | PRN

## 2021-05-01 MED ORDER — CEFAZOLIN SODIUM-DEXTROSE 2-4 GM/100ML-% IV SOLN: 2.0000 g | Freq: Once | INTRAVENOUS | Status: DC

## 2021-05-01 MED ORDER — SUGAMMADEX SODIUM 200 MG/2ML IV SOLN
INTRAVENOUS | Status: DC | PRN
  Administered 2021-05-01: 200 mg via INTRAVENOUS

## 2021-05-01 MED ORDER — OXYCODONE HCL 5 MG/5ML PO SOLN: 5.0000 mg | Freq: Once | ORAL | Status: DC | PRN

## 2021-05-01 MED ORDER — ONDANSETRON HCL 4 MG/2ML IJ SOLN
4.0000 mg | Freq: Four times a day (QID) | INTRAMUSCULAR | Status: DC
  Administered 2021-05-01 – 2021-05-02 (×3): 4 mg via INTRAVENOUS
  Filled 2021-05-01 (×3): qty 2

## 2021-05-01 MED ORDER — LIDOCAINE HCL 2 % IJ SOLN
INTRAMUSCULAR | Status: AC
  Filled 2021-05-01: qty 20

## 2021-05-01 MED ORDER — HEPARIN SODIUM (PORCINE) 5000 UNIT/ML IJ SOLN
5000.0000 [IU] | Freq: Once | INTRAMUSCULAR | Status: AC
  Administered 2021-05-01: 5000 [IU] via SUBCUTANEOUS
  Filled 2021-05-01: qty 1

## 2021-05-01 MED ORDER — PROPOFOL 500 MG/50ML IV EMUL
INTRAVENOUS | Status: DC | PRN
  Administered 2021-05-01: 25 ug/kg/min via INTRAVENOUS

## 2021-05-01 MED ORDER — ACETAMINOPHEN 160 MG/5ML PO SOLN: 325.0000 mg | ORAL | Status: DC | PRN

## 2021-05-01 MED ORDER — MEPERIDINE HCL 50 MG/ML IJ SOLN: 6.2500 mg | INTRAMUSCULAR | Status: DC | PRN

## 2021-05-01 MED ORDER — ONDANSETRON HCL 4 MG/2ML IJ SOLN: 4.0000 mg | Freq: Once | INTRAMUSCULAR | Status: DC | PRN

## 2021-05-01 MED ORDER — ONDANSETRON HCL 4 MG/2ML IJ SOLN
INTRAMUSCULAR | Status: DC | PRN
  Administered 2021-05-01: 4 mg via INTRAVENOUS

## 2021-05-01 MED ORDER — DEXAMETHASONE SODIUM PHOSPHATE 4 MG/ML IJ SOLN: 4.0000 mg | INTRAMUSCULAR | Status: DC

## 2021-05-01 MED ORDER — SCOPOLAMINE 1 MG/3DAYS TD PT72
1.0000 | MEDICATED_PATCH | TRANSDERMAL | Status: DC
  Administered 2021-05-01: 1.5 mg via TRANSDERMAL
  Filled 2021-05-01: qty 1

## 2021-05-01 MED ORDER — DEXAMETHASONE SODIUM PHOSPHATE 10 MG/ML IJ SOLN
INTRAMUSCULAR | Status: DC | PRN
  Administered 2021-05-01: 10 mg via INTRAVENOUS

## 2021-05-01 MED ORDER — BUPIVACAINE LIPOSOME 1.3 % IJ SUSP
INTRAMUSCULAR | Status: DC | PRN
  Administered 2021-05-01: 20 mL

## 2021-05-01 MED ORDER — LACTATED RINGERS IV SOLN: INTRAVENOUS | Status: DC

## 2021-05-01 MED ORDER — LIDOCAINE 2% (20 MG/ML) 5 ML SYRINGE
INTRAMUSCULAR | Status: DC | PRN
  Administered 2021-05-01: 100 mg via INTRAVENOUS

## 2021-05-01 MED ORDER — ROCURONIUM BROMIDE 10 MG/ML (PF) SYRINGE
PREFILLED_SYRINGE | INTRAVENOUS | Status: AC
  Filled 2021-05-01: qty 10

## 2021-05-01 SURGICAL SUPPLY — 60 items
APL PRP STRL LF DISP 70% ISPRP (MISCELLANEOUS) ×2
APL SKNCLS STERI-STRIP NONHPOA (GAUZE/BANDAGES/DRESSINGS)
APPLIER CLIP ROT 10 11.4 M/L (STAPLE)
APR CLP MED LRG 11.4X10 (STAPLE)
BAG COUNTER SPONGE SURGICOUNT (BAG) IMPLANT
BAG SPNG CNTER NS LX DISP (BAG)
BENZOIN TINCTURE PRP APPL 2/3 (GAUZE/BANDAGES/DRESSINGS) IMPLANT
CABLE HIGH FREQUENCY MONO STRZ (ELECTRODE) IMPLANT
CHLORAPREP W/TINT 26 (MISCELLANEOUS) ×3 IMPLANT
CLIP APPLIE ROT 10 11.4 M/L (STAPLE) IMPLANT
DEVICE SUT QUICK LOAD TK 5 (STAPLE) ×4 IMPLANT
DEVICE SUT TI-KNOT TK 5X26 (MISCELLANEOUS) ×1 IMPLANT
DEVICE SUTURE ENDOST 10MM (ENDOMECHANICALS) ×3 IMPLANT
DISSECTOR BLUNT TIP ENDO 5MM (MISCELLANEOUS) ×2 IMPLANT
DRAIN PENROSE 0.5X18 (DRAIN) ×3 IMPLANT
DRSG TEGADERM 2-3/8X2-3/4 SM (GAUZE/BANDAGES/DRESSINGS) ×6 IMPLANT
ELECT L-HOOK LAP 45CM DISP (ELECTROSURGICAL)
ELECT REM PT RETURN 15FT ADLT (MISCELLANEOUS) ×3 IMPLANT
ELECTRODE L-HOOK LAP 45CM DISP (ELECTROSURGICAL) ×2 IMPLANT
GAUZE SPONGE 2X2 8PLY STRL LF (GAUZE/BANDAGES/DRESSINGS) IMPLANT
GLOVE BIO SURGEON STRL SZ7.5 (GLOVE) ×3 IMPLANT
GLOVE BIOGEL PI IND STRL 7.0 (GLOVE) IMPLANT
GLOVE BIOGEL PI IND STRL 8 (GLOVE) ×2 IMPLANT
GLOVE BIOGEL PI INDICATOR 7.0 (GLOVE)
GLOVE BIOGEL PI INDICATOR 8 (GLOVE) ×1
GLOVE BIOGEL PI ORTHO PRO 7.5 (GLOVE) ×1
GLOVE PI ORTHO PRO STRL 7.5 (GLOVE) ×2 IMPLANT
GOWN STRL REUS W/ TWL XL LVL3 (GOWN DISPOSABLE) ×6 IMPLANT
GOWN STRL REUS W/TWL XL LVL3 (GOWN DISPOSABLE) ×9
GRASPER SUT TROCAR 14GX15 (MISCELLANEOUS) ×3 IMPLANT
IRRIG SUCT STRYKERFLOW 2 WTIP (MISCELLANEOUS) ×3
IRRIGATION SUCT STRKRFLW 2 WTP (MISCELLANEOUS) ×2 IMPLANT
KIT BASIN OR (CUSTOM PROCEDURE TRAY) ×3 IMPLANT
KIT ESOPHYX Z+ (Miscellaneous) ×1 IMPLANT
KIT TURNOVER KIT A (KITS) ×1 IMPLANT
NS IRRIG 1000ML POUR BTL (IV SOLUTION) ×3 IMPLANT
PACK UNIVERSAL I (CUSTOM PROCEDURE TRAY) ×3 IMPLANT
PENCIL SMOKE EVACUATOR (MISCELLANEOUS) IMPLANT
SCISSORS LAP 5X45 EPIX DISP (ENDOMECHANICALS) ×3 IMPLANT
SET TUBE SMOKE EVAC HIGH FLOW (TUBING) ×4 IMPLANT
SHEARS HARMONIC ACE PLUS 45CM (MISCELLANEOUS) ×3 IMPLANT
SLEEVE XCEL OPT CAN 5 100 (ENDOMECHANICALS) ×9 IMPLANT
SPIKE FLUID TRANSFER (MISCELLANEOUS) ×2 IMPLANT
SPONGE GAUZE 2X2 STER 10/PKG (GAUZE/BANDAGES/DRESSINGS) ×1
STRIP CLOSURE SKIN 1/2X4 (GAUZE/BANDAGES/DRESSINGS) IMPLANT
SUT ETHIBOND 2 0 SH (SUTURE) ×9
SUT ETHIBOND 2 0 SH 36X2 (SUTURE) ×6 IMPLANT
SUT MNCRL AB 4-0 PS2 18 (SUTURE) ×3 IMPLANT
SUT SURGIDAC NAB ES-9 0 48 120 (SUTURE) ×12 IMPLANT
TIP INNERVISION DETACH 40FR (MISCELLANEOUS) IMPLANT
TIP INNERVISION DETACH 50FR (MISCELLANEOUS) IMPLANT
TIP INNERVISION DETACH 56FR (MISCELLANEOUS) IMPLANT
TIPS INNERVISION DETACH 40FR (MISCELLANEOUS)
TOWEL OR 17X26 10 PK STRL BLUE (TOWEL DISPOSABLE) ×3 IMPLANT
TOWEL OR NON WOVEN STRL DISP B (DISPOSABLE) IMPLANT
TRAY FOLEY MTR SLVR 16FR STAT (SET/KITS/TRAYS/PACK) ×3 IMPLANT
TRAY LAPAROSCOPIC (CUSTOM PROCEDURE TRAY) ×3 IMPLANT
TROCAR BLADELESS OPT 5 100 (ENDOMECHANICALS) ×3 IMPLANT
TROCAR XCEL BLUNT TIP 100MML (ENDOMECHANICALS) IMPLANT
TROCAR XCEL NON-BLD 11X100MML (ENDOMECHANICALS) ×3 IMPLANT

## 2021-05-01 NOTE — Anesthesia Preprocedure Evaluation (Addendum)
Anesthesia Evaluation  ?Patient identified by MRN, date of birth, ID band ?Patient awake ? ? ? ?Reviewed: ?Allergy & Precautions, NPO status , Patient's Chart, lab work & pertinent test results ? ?History of Anesthesia Complications ?(+) PONV and history of anesthetic complications ? ?Airway ?Mallampati: I ? ?TM Distance: >3 FB ?Neck ROM: Full ? ? ? Dental ?no notable dental hx. ?(+) Dental Advisory Given, Teeth Intact, Caps ?  ?Pulmonary ?neg pulmonary ROS, former smoker,  ?  ?Pulmonary exam normal ?breath sounds clear to auscultation ? ? ? ? ? ? Cardiovascular ?hypertension, Normal cardiovascular exam ?Rhythm:Regular Rate:Normal ? ? ?  ?Neuro/Psych ? Headaches, PSYCHIATRIC DISORDERS Anxiety Depression  Neuromuscular disease   ? GI/Hepatic ?Neg liver ROS, hiatal hernia, PUD, GERD  Medicated,  ?Endo/Other  ?negative endocrine ROS ? Renal/GU ?negative Renal ROS  ? ?  ?Musculoskeletal ?negative musculoskeletal ROS ?(+)  ? Abdominal ?  ?Peds ? Hematology ?negative hematology ROS ?(+)   ?Anesthesia Other Findings ? ? Reproductive/Obstetrics ?negative OB ROS ? ?  ? ? ? ? ? ? ? ? ? ? ? ? ? ?  ?  ? ? ? ? ? ? ? ?Anesthesia Physical ? ?Anesthesia Plan ? ?ASA: 2 ? ?Anesthesia Plan: General  ? ?Post-op Pain Management: Minimal or no pain anticipated  ? ?Induction: Intravenous ? ?PONV Risk Score and Plan: 4 or greater and Ondansetron, Dexamethasone, Treatment may vary due to age or medical condition, Scopolamine patch - Pre-op and Midazolam ? ?Airway Management Planned: Oral ETT ? ?Additional Equipment: None ? ?Intra-op Plan:  ? ?Post-operative Plan: Extubation in OR ? ?Informed Consent: I have reviewed the patients History and Physical, chart, labs and discussed the procedure including the risks, benefits and alternatives for the proposed anesthesia with the patient or authorized representative who has indicated his/her understanding and acceptance.  ? ? ? ?Dental advisory given ? ?Plan  Discussed with: CRNA and Anesthesiologist ? ?Anesthesia Plan Comments:   ? ? ? ? ? ? ?Anesthesia Quick Evaluation ? ?

## 2021-05-01 NOTE — Op Note (Addendum)
[facility] ?Patient Name: Kristen Nichols ?Procedure Date: 05/01/2021 ?MRN: [id number] ?Attending MD: Gerrit Heck , MD ?Date of Birth: 09/09/1955 ?CSN: [id number] ?Age: 66 ?Admit Type: Inpatient ?Procedure:                Upper GI endoscopy with Savary dilation and  ?                          endoscopic fundoplication ?Indications:              For therapy of reflux esophagitis, For therapy of  ?                          hiatal hernia ?Providers:                Gerrit Heck, MD, Doristine Johns, RN, Cindee Salt  ?                          Ouida Sills Technician, Greer Pickerel, MD (co-surgeon) ?Referring MD:              ?Medicines:                General Anesthesia ?Complications:            No immediate complications. ?Estimated Blood Loss:     Estimated blood loss was minimal. ?Procedure:                Pre-Anesthesia Assessment: ?                          - Prior to the procedure, a History and Physical  ?                          was performed, and patient medications and  ?                          allergies were reviewed. The patient's tolerance of  ?                          previous anesthesia was also reviewed. The risks  ?                          and benefits of the procedure and the sedation  ?                          options and risks were discussed with the patient.  ?                          All questions were answered, and informed consent  ?                          was obtained. Prior Anticoagulants: The patient has  ?                          taken no previous anticoagulant or antiplatelet  ?  agents. ASA Grade Assessment: II - A patient with  ?                          mild systemic disease. After reviewing the risks  ?                          and benefits, the patient was deemed in  ?                          satisfactory condition to undergo the procedure. ?                          After obtaining informed consent, the endoscope was  ?                           passed under direct vision. Throughout the  ?                          procedure, the patient's blood pressure, pulse, and  ?                          oxygen saturations were monitored continuously. The  ?                          GIF-H190 ([id number]) Olympus endoscope was introduced  ?                          through the mouth, and advanced to the second part  ?                          of duodenum. The upper GI endoscopy was  ?                          accomplished without difficulty. The patient  ?                          tolerated the procedure well. ?Scope In: ?Scope Out: ?Findings: ?     The upper and middle esophagus was normal. The endoscope was initially  ?     done during the laparoscopic hiatal hernia repair for confirmation of  ?     appropriate position of esophagus in the intra-abdominal cavity.  ?     Confirmed approximately 2 cm of esophagus below the diaphragm using  ?     transillumination. The endoscope was then withdrawn, with completion of  ?     hernia repair and crural closure laporoscopically. After conclusion of  ?     the laporoscopic repair, the endoscopic fundoplication portion was then  ?     completed as below. ?     The Z-line was regular and was found 39 cm from the incisors. There was  ?     LA Grade A esophagitis. In preparation of placing the larger caliber  ?     Esophyx device, a guidewire was placed and the scope was withdrawn.  ?     Dilation was performed with a Savary dilator with mild resistance  at 17  ?     mm. The dilation site was examined following endoscope reinsertion and  ?     showed no bleeding, mucosal tear or perforation. Estimated blood loss:  ?     none. ?     The gastroesophageal flap valve was visualized endoscopically and  ?     classified as Hill Grade II (fold present, opens with respiration).  ?     There was decreased LES laxity and posterior portion of the valve  ?     appeared much improved following the preceeding laporoscopic hernia  ?      repair. The decision was made to perform transoral fundoplication with  ?     the EsophyX Z+ system. The endoscope was withdrawn, placed through the  ?     plication device, reinserted into the patient and advanced past the  ?     level of the GE junction at 39 cm from the incisors and into the  ?     stomach. Next, the endoscope was advanced beyond the device and  ?     retroflexed. The first plication site was identified at the 1 o'clock  ?     position. With the device in the proper position, the helical retractor  ?     was deployed and tissue was pulled into the mold before it was closed.  ?     The device was rotated, suction was applied using the invaginator, then  ?     the device was advanced slightly and two H-shaped fasteners were placed.  ?     The device was reloaded and the process repeated in order to deploy a  ?     total of eight fasteners at the first site. The device was rotated to  ?     the 11 o'clock position with confirmation of an intact repair at that  ?     site. The device was then rotated to the 5 o'clock position after which  ?     the helical retractor was used to grasp additional tissue within the  ?     mold before rotation and deployment of a total of two fasteners at the  ?     second site. To complete reconstruction of the valve, additional  ?     fasteners were deployed at the following sites: two fasteners at 7  ?     o'clock position. In total, 12 fasteners contributed to create a valve  ?     measuring 3 cm in length which involved 300 degrees of the circumference  ?     upon retroflexed view. Following the procedure, the flap valve was  ?     reclassified as Hill Grade I (prominent fold, tight to endoscope). The  ?     EsophyX device and endoscope were then removed. Relook endoscopy was  ?     performed prior to the conclusion of the case to confirm the above  ?     findings. Estimated blood loss was minimal. ?     The entire examined stomach was normal. ?     The examined  duodenum was normal. ?Impression:               - Normal esophagus. The esophagus was dilated with  ?  a 17 mm Savary dilator. ?                          - Mild LA Grade A esophagitis. Otherwise, Z-line  ?                          regular, 39 cm from the incisors. ?                          - Gastroesophageal flap valve classified as Hill  ?                          Grade II (fold present, opens with respiration). ?                          - Normal stomach. ?                          - Normal examined duodenum. ?                          - Enodscopic fundoplication using Esophyx Z+ device  ?                          was successfully performed. ?                          - No specimens collected. ?Moderate Sedation: ?     Not Applicable - Patient had care per Anesthesia. ?Recommendation:           -Admit to surgical ward for overnight observation  ?                          with anticipated discharge tomorrow ?                          -Zofran 4 mg IV every 6 hours x24 hours, then prn ?                          -Reglan 10 mg every 6 hours x24 hours, then prn ?                          -Resume scopolamine patch x3 days (applied preop) ?                          -Protonix 40 mg p.o. BID x2 weeks, then 40 mg daily  ?                          x2 weeks, then 20 mg daily x1 week then prn ?                          -Decadron 8 mg every 6 hours times max 5 doses ?                          -Gas-X (simethicone) 80  mg p.o. prn every 6 hours  ?                          gas pain, abdominal discomfort ?                          -Tylenol 3 (APAP 120 mg/codeine 12 mg per 5 mL): 15  ?                          mL's every 4 hours prn pain ?                          -Colace 100 mg p.o. twice daily if taking pain  ?                          medications ?                          -Clear liquid diet okay overnight ?                          -Okay to ambulate with assist around the ward ?                           -Please do not hesitate to contact me directly with  ?                          any postoperative questions or concerns ?Procedure Code(s):        --- Professional --- ?                          [id number], Esophag

## 2021-05-01 NOTE — Op Note (Addendum)
05/01/2021 ? ?2:44 PM ? ?PATIENT:  Kristen Nichols  66 y.o. female ? ?PRE-OPERATIVE DIAGNOSIS:  SLIDING HIATAL HERNIA;  GERD, h/o TIF 12/2018 ? ?POST-OPERATIVE DIAGNOSIS:  same ? ?PROCEDURE:  Procedure(s): ?LAPAROSCOPIC REPAIR OF HIATAL HERNIA ?ESOPHAGOGASTRODUODENOSCOPY (EGD) - Dr Bryan Lemma ?TRANSORAL INCISIONLESS FUNDOPLICATION - Dr Bryan Lemma ?LAPAROSCOPIC BILATERAL TAP BLOCK ? ?SURGEON:  Surgeon(s): ?Cirigliano, Dominic Pea, DO -( Co- Psychologist, sport and exercise) ?Greer Pickerel, MD FACS ? ?ASSISTANTS: Johnathan Hausen MD FACS ? ?ANESTHESIA:   general ? ?DRAINS: none  ? ?LOCAL MEDICATIONS USED:  OTHER Exparel ? ?SPECIMEN:  No Specimen ? ?DISPOSITION OF SPECIMEN:  PATHOLOGY ? ?EBL: 25 CC ? ?COUNTS:  YES ? ?INDICATION FOR PROCEDURE: 66 year old female who underwent a TIF procedure in December 2020 who had good resolution of her symptoms but had recurrent symptoms last summer.  She was found to have a sliding hiatal hernia on upper GI and EGD.  Her wrap also looked a little bit loose per GI.  She had manometry which was normal.  She was referred by her gastroenterologist to repair her hiatal hernia with them planning to place a few more tacks for her TIF wrap.  Please see outside records for additional information ? ?PROCEDURE: After obtaining informed consent the patient was taken to the OR 1 at [facility].  She was placed supine on the operating room table.  Her arms were tucked at her side with the appropriate padding.  Sequential compression devices were placed.  General endotracheal anesthesia was established.  A Foley catheter was placed.  Her abdomen was prepped and draped in the usual standard surgical fashion.  A surgical timeout was performed.  IV antibiotic had been administered.  Access to the abdomen was gained using the Optiview technique in the left upper quadrant at Palmer's point.  A small incision was made.  Then using a 0 degree 5 mm laparoscope through a 5 mm trocar I advanced it through all layers of the  abdominal wall and carefully entered the abdominal cavity.  Pneumoperitoneum was smoothly established up to a patient pressure of 15 mmHg.  The patient was then placed in reverse Trendelenburg.  Additional 5 mm trocars were placed slightly above into the left of the umbilicus, right lateral abdominal wall, left lateral abdominal wall and an 11 mm trocar in the right mid abdomen all under direct visualization.  A Nathanson liver retractor was placed through the subxiphoid position to lift up the left lobe of the liver to expose the hiatus.  A bilateral laparoscopic tap block with Exparel was performed for postoperative pain relief. ? ?The upper abdomen was inspected.  There appeared to be evidence of a sliding hiatal hernia.  Hernia probably about 3 cm.  It appeared that portions of her wrap was herniated through the diaphragm.  It appeared that a portion of the wrap had been tacked to the left crus of the diaphragm as well.  The gastrohepatic ligament was incised with harmonic scalpel.  The right crus was identified.  My assistant gently grabbed the upper stomach and rotated it toward the left upper quadrant.  I gently incised the peritoneum over the right crus and took out the dissection bluntly along the right crura across anteriorly.  There is some scarring anteriorly.  The left crus was a little bit splayed anteriorly.  I was able to create a retrogastric window at the diaphragm and placed a Penrose drain.  We had anesthesia placed a Ewald tube down the oropharynx down the esophagus prior to creating  this retrogastric passage.  The posterior vagus nerve was identified.  I took down some avascular tissue in the mediastinum circumferentially and mobilized the distal esophagus.  A portion of the wrap was still left intact as a gastropexy to the left crus laterally.  In discussion with the gastroenterologist present during surgery we decided to leave that alone and not take it down.  At this point I requested  gastroenterology perform an upper endoscopy to make sure that we had mobilized enough of the esophagus back into the abdominal cavity.  Dr. Bryan Lemma performed an upper endoscopy.  There is no evidence of injury to the esophagus.  The Z-line was visualized approximately 1 cm below the crura.  He retroflexed and visualize the previous wrap.  He felt there was enough intra-abdominal esophageal length.  The stomach was decompressed and the endoscope was withdrawn.  At this point I closed the diaphragm/reapproximate the left and right crura with 2 interrupted 0 Ethibond sutures each secured with a titanium tie knot.  This left an adequate space to accommodate his 86 [place] instrument that would be used to perform the test.  The Nathanson liver retractor was removed.  The 11 mm trocar was removed and the fascial defect was closed with a 0 Vicryl using a PMI suture passer with laparoscopic guidance.  Remaining trocars were removed.  Pneumoperitoneum was released.  Skin incisions were closed with a 4 Monocryl in a subcuticular fashion.  We then applied Steri-Strips 2 x 2's and Tegaderms. ? ?At this point the patient remained intubated and was being prepared by the endoscopy team for the TIF portion of the procedure.  Please see his portion of the operative note regarding his description of his portion of the procedure ? ?PLAN OF CARE: Admit for overnight observation ? ?PATIENT DISPOSITION:   STILL IN SURGERY WITH GI ?  ?Delay start of Pharmacological VTE agent (>24hrs) due to surgical blood loss or risk of bleeding:  no ? ?Leighton Ruff. Redmond Pulling, MD, FACS ?General, Bariatric, & Minimally Invasive Surgery ?[facility] ? ? ? ? ? ? ? ? ? ? ? ? ? ?

## 2021-05-01 NOTE — Transfer of Care (Signed)
Immediate Anesthesia Transfer of Care Note ? ?Patient: Kristen Nichols ? ?Procedure(s) Performed: LAPAROSCOPIC REPAIR OF HIATAL HERNIA ?ESOPHAGOGASTRODUODENOSCOPY (EGD) ?TRANSORAL INCISIONLESS FUNDOPLICATION ?SAVORY DILATION ? ?Patient Location: PACU ? ?Anesthesia Type:General ? ?Level of Consciousness: awake, alert  and oriented ? ?Airway & Oxygen Therapy: Patient Spontanous Breathing and Patient connected to face mask oxygen ? ?Post-op Assessment: Report given to RN and Post -op Vital signs reviewed and stable ? ?Post vital signs: Reviewed and stable ? ?Last Vitals:  ?Vitals Value Taken Time  ?BP 157/85 05/01/21 1535  ?Temp    ?Pulse 81 05/01/21 1535  ?Resp 15 05/01/21 1535  ?SpO2 100 % 05/01/21 1535  ?Vitals shown include unvalidated device data. ? ?Last Pain:  ?Vitals:  ? 05/01/21 1052  ?TempSrc:   ?PainSc: 2   ?   ? ?Patients Stated Pain Goal: 1 (05/01/21 1052) ? ?Complications: No notable events documented. ?

## 2021-05-01 NOTE — Interval H&P Note (Signed)
History and Physical Interval Note: ? ?05/01/2021 ?12:40 PM ? ?Kristen Nichols  has presented today for surgery, with the diagnosis of HIATAL HERNIA GERD.  The various methods of treatment have been discussed with the patient and family. After consideration of risks, benefits and other options for treatment, the patient has consented to  Procedure(s): ?LAPAROSCOPIC REPAIR OF HIATAL HERNIA (N/A) ?ESOPHAGOGASTRODUODENOSCOPY (EGD) (N/A) ?TRANSORAL INCISIONLESS FUNDOPLICATION (N/A) as a surgical intervention.  The patient's history has been reviewed, patient examined, no change in status, stable for surgery.  I have reviewed the patient's chart and labs.  Questions were answered to the patient's satisfaction.   ? ? ?[name] V Rondo Spittler ? ? ?

## 2021-05-01 NOTE — H&P (View-Only) (Signed)
?     ?Chief Complaint: GERD, hiatal hernia ? ?GI history: 65 year old female initially seen by me on 02/19/2018 for alternating bowel habits, abdominal pain, and reflux symptoms, now s/p Transoral Incisionless Fundoplication (TIF) 09/16/8336. ?  ?Longstanding history of GERD, complicated by erosive esophagitis. Index symptoms of heartburn and regurgitation, belch. No dysphagia or odynophagia.  Previous intolerance to omeprazole daily (nausea), so she was only taking intermittently.   EGD in 02/2018 with LA grade A esophagitis, 1 cm hiatal hernia with a Hill grade 2 valve.  Prescribed Prilosec 20 mg p.o. twice daily x8 weeks with good clinical response to therapy, but still with intermittent breakthrough sxs. She eventually tritrated off due to nausea with PPI. Completed TIF on 12/11/2018 with resolution of index reflux symptoms.  Had titrated off all acid suppression therapy. ?  ?Started having mild, intermittent heartburn and rare regurgitation in 04/2020.  Symptoms started increasing in 11/2020 with daily heartburn and regurgitation.  Restarted Protonix 40 mg bid with good symptom control.  Repeat upper endoscopy in 12/2020 with erosive esophagitis, significant LES laxity with suspicion of hernia recurrence. ?  ?Endoscopic history: ?- EGD (12/2020, Dr. Bryan Lemma): LA Grade A esophagitis with single mucosal break, Hill grade 3 valve.  Normal stomach/duodenum. ?-EGD with Transoral Incisionless Fundoplication (25/0539, Dr. Bryan Lemma): LA Grade A esophagitis, 1 cm HH, 22 fasteners placed with 270 degree valve 3 cm in length ?-EGD (2020, Dr. Bryan Lemma): 1 cm hiatal hernia, LA grade A esophagitis, Hill grade 2 valve, normal stomach and duodenum. ?-Colonoscopy (2020, Dr. Bryan Lemma): 3 small, 2 to 3 mm, Hyperplastic polyps, sigmoid diverticulosis, otherwise normal with biopsies negative for [facility].  Recommended repeat in 10 years. ?-Colonoscopy (2014, Dr. Harl Bowie at [facility]): 5 mm polyp in cecum and 3 mm  polyp in transverse colon (path polypoid colonic mucosa with benign reactive lymphoid hyperplasia), normal TI, sigmoid diverticulosis ?-Colonoscopy (2012, [place]): No report available for review.  Patient reports polyps ?     ? ? ? ?HPI:   ? ? ?Kristen Nichols is a 66 y.o. female presenting today for concomitant laparoscopic hiatal hernia repair, crural repair, and TIF.  Continues to have regurgitation despite continued acid suppression therapy.  Otherwise no significant changes in medical history since last appointment. ? ? ?Past Medical History:  ?Diagnosis Date  ? Allergy   ? Hayfever  ? Anxiety   ? Benign essential hypertension 10/13/2012  ? Last Assessment & Plan:  Well controlled.    ? Colitis   ? Diverticulosis   ? GERD (gastroesophageal reflux disease)   ? History of colon polyps   ? Hypertension   ? IBS (irritable bowel syndrome)   ? PONV (postoperative nausea and vomiting)   ? after ankle surgery in 1994  ? UC (ulcerative colitis) ([place])   ? ? ? ?Past Surgical History:  ?Procedure Laterality Date  ? ANKLE SURGERY Left   ? pin in place   ? BREAST BIOPSY Right 2017  ? marker in place   ? BREAST CYST ASPIRATION Right 2016  ? CESAREAN SECTION    ? x2  ? COLONOSCOPY    ? has had 2 or 3 of them per patient. Last one was around 2016 with [facility] or maybe [facility] , last february 2020 now toal of 4   ? ESOPHAGEAL MANOMETRY N/A 03/01/2021  ? Procedure: ESOPHAGEAL MANOMETRY (EM);  Surgeon: Lavena Bullion, DO;  Location: [facility];  Service: Gastroenterology;  Laterality: N/A;  ? ESOPHAGOGASTRODUODENOSCOPY (EGD) WITH PROPOFOL N/A  12/11/2018  ? Procedure: ESOPHAGOGASTRODUODENOSCOPY (EGD) WITH PROPOFOL;  Surgeon: Lavena Bullion, DO;  Location: [facility];  Service: Gastroenterology;  Laterality: N/A;  ? MALONEY DILATION  12/11/2018  ? Procedure: MALONEY DILATION;  Surgeon: Lavena Bullion, DO;  Location: [facility];  Service: Gastroenterology;;  ? NASAL SINUS SURGERY    ? TRANSORAL INCISIONLESS  FUNDOPLICATION N/A 23/03/74  ? Procedure: TRANSORAL INCISIONLESS FUNDOPLICATION;  Surgeon: Lavena Bullion, DO;  Location: [facility];  Service: Gastroenterology;  Laterality: N/A;  ? ?Family History  ?Problem Relation Age of Onset  ? Other Mother   ? Other Father   ? Cancer Father   ?     small cell lung cancer-limited   ? Diabetes Maternal Grandmother   ? Colon cancer Neg Hx   ? Esophageal cancer Neg Hx   ? ?Social History  ? ?Tobacco Use  ? Smoking status: Former  ?  Packs/day: 0.50  ?  Years: 20.00  ?  Pack years: 10.00  ?  Types: Cigarettes  ?  Quit date: 01/09/2016  ?  Years since quitting: 5.3  ? Smokeless tobacco: Never  ?Vaping Use  ? Vaping Use: Former  ?Substance Use Topics  ? Alcohol use: Yes  ?  Alcohol/week: 2.0 standard drinks  ?  Types: 2 Cans of beer per week  ?  Comment: occas  ? Drug use: No  ? ?Current Facility-Administered Medications  ?Medication Dose Route Frequency Provider Last Rate Last Admin  ? 0.9 %  sodium chloride infusion   Intravenous Continuous Kei Mcelhiney V, DO      ? bupivacaine liposome (EXPAREL) 1.3 % injection 266 mg  20 mL Infiltration Once Greer Pickerel, MD      ? ceFAZolin (ANCEF) IVPB 2g/100 mL premix  2 g Intravenous On Call to OR Greer Pickerel, MD      ? Chlorhexidine Gluconate Cloth 2 % PADS 6 each  6 each Topical Once Greer Pickerel, MD      ? And  ? Chlorhexidine Gluconate Cloth 2 % PADS 6 each  6 each Topical Once Greer Pickerel, MD      ? dexamethasone (DECADRON) injection 4 mg  4 mg Intravenous On Call to OR Greer Pickerel, MD      ? Derrill Memo ON 05/02/2021] feeding supplement (ENSURE PRE-SURGERY) liquid 296 mL  296 mL Oral Once Greer Pickerel, MD      ? lactated ringers infusion   Intravenous Continuous Duane Boston, MD 10 mL/hr at 05/01/21 1101 New Bag at 05/01/21 1101  ? scopolamine (TRANSDERM-SCOP) 1 MG/3DAYS 1.5 mg  1 patch Transdermal On Call to OR Greer Pickerel, MD   1.5 mg at 05/01/21 1053  ? ?No Known Allergies ? ? ?Review of Systems: ?All systems reviewed and  negative except where noted in HPI.  ? ? ? ?Physical Exam:   ? ?Wt Readings from Last 3 Encounters:  ?05/01/21 56.2 kg  ?04/12/21 56.2 kg  ?03/06/21 56.7 kg  ? ? ?BP 112/78   Pulse 77   Temp 98.3 ?F (36.8 ?C) (Oral)   Resp 16   Ht 5' 2"  (1.575 m)   Wt 56.2 kg   SpO2 100%   BMI 22.66 kg/m?  ?Constitutional:  Pleasant, in no acute distress. ?Psychiatric: Normal mood and affect. Behavior is normal. ?EENT: Pupils normal.  Conjunctivae are normal. No scleral icterus. ?Cardiovascular: Normal rate, regular rhythm. No edema ?Pulmonary/chest: Effort normal and breath sounds normal. No wheezing, rales or rhonchi. ?Abdominal: Soft, nondistended, nontender. Bowel sounds active  throughout. There are no masses palpable. No hepatomegaly. ?Neurological: Alert and oriented to person place and time. ?Skin: Skin is warm and dry. No rashes noted. ? ? ?ASSESSMENT AND PLAN;  ? ?1) GERD with erosive esophagitis ?2) Hiatal hernia ?3) LES laxity ?- Plan for concomitant laparoscopic hiatal hernia repair with Dr. Redmond Pulling followed by Transoral Incisionless Fundoplication (cTIF) ?- Tentative plan for overnight admission for 23-hour postoperative observation ? ? ?Lavena Bullion, DO, FACG  05/01/2021, 12:35 PM ? ? ?No ref. provider found ? ?

## 2021-05-01 NOTE — Consult Note (Signed)
?     ?Chief Complaint: GERD, hiatal hernia ? ?GI history: 66 year old female initially seen by me on 02/19/2018 for alternating bowel habits, abdominal pain, and reflux symptoms, now s/p Transoral Incisionless Fundoplication (TIF) 16/01/958. ?  ?Longstanding history of GERD, complicated by erosive esophagitis. Index symptoms of heartburn and regurgitation, belch. No dysphagia or odynophagia.  Previous intolerance to omeprazole daily (nausea), so she was only taking intermittently.   EGD in 02/2018 with LA grade A esophagitis, 1 cm hiatal hernia with a Hill grade 2 valve.  Prescribed Prilosec 20 mg p.o. twice daily x8 weeks with good clinical response to therapy, but still with intermittent breakthrough sxs. She eventually tritrated off due to nausea with PPI. Completed TIF on 12/11/2018 with resolution of index reflux symptoms.  Had titrated off all acid suppression therapy. ?  ?Started having mild, intermittent heartburn and rare regurgitation in 04/2020.  Symptoms started increasing in 11/2020 with daily heartburn and regurgitation.  Restarted Protonix 40 mg bid with good symptom control.  Repeat upper endoscopy in 12/2020 with erosive esophagitis, significant LES laxity with suspicion of hernia recurrence. ?  ?Endoscopic history: ?- EGD (12/2020, Dr. Bryan Lemma): LA Grade A esophagitis with single mucosal break, Hill grade 3 valve.  Normal stomach/duodenum. ?-EGD with Transoral Incisionless Fundoplication (45/4098, Dr. Bryan Lemma): LA Grade A esophagitis, 1 cm HH, 22 fasteners placed with 270 degree valve 3 cm in length ?-EGD (2020, Dr. Bryan Lemma): 1 cm hiatal hernia, LA grade A esophagitis, Hill grade 2 valve, normal stomach and duodenum. ?-Colonoscopy (2020, Dr. Bryan Lemma): 3 small, 2 to 3 mm, Hyperplastic polyps, sigmoid diverticulosis, otherwise normal with biopsies negative for [facility].  Recommended repeat in 10 years. ?-Colonoscopy (2014, Dr. Harl Bowie at [facility]): 5 mm polyp in cecum and 3 mm  polyp in transverse colon (path polypoid colonic mucosa with benign reactive lymphoid hyperplasia), normal TI, sigmoid diverticulosis ?-Colonoscopy (2012, [place]): No report available for review.  Patient reports polyps ?     ? ? ? ?HPI:   ? ? ?Kristen Nichols is a 66 y.o. female presenting today for concomitant laparoscopic hiatal hernia repair, crural repair, and TIF.  Continues to have regurgitation despite continued acid suppression therapy.  Otherwise no significant changes in medical history since last appointment. ? ? ?Past Medical History:  ?Diagnosis Date  ? Allergy   ? Hayfever  ? Anxiety   ? Benign essential hypertension 10/13/2012  ? Last Assessment & Plan:  Well controlled.    ? Colitis   ? Diverticulosis   ? GERD (gastroesophageal reflux disease)   ? History of colon polyps   ? Hypertension   ? IBS (irritable bowel syndrome)   ? PONV (postoperative nausea and vomiting)   ? after ankle surgery in 1994  ? UC (ulcerative colitis) ([place])   ? ? ? ?Past Surgical History:  ?Procedure Laterality Date  ? ANKLE SURGERY Left   ? pin in place   ? BREAST BIOPSY Right 2017  ? marker in place   ? BREAST CYST ASPIRATION Right 2016  ? CESAREAN SECTION    ? x2  ? COLONOSCOPY    ? has had 2 or 3 of them per patient. Last one was around 2016 with [facility] or maybe [facility] , last february 2020 now toal of 4   ? ESOPHAGEAL MANOMETRY N/A 03/01/2021  ? Procedure: ESOPHAGEAL MANOMETRY (EM);  Surgeon: Lavena Bullion, DO;  Location: [facility];  Service: Gastroenterology;  Laterality: N/A;  ? ESOPHAGOGASTRODUODENOSCOPY (EGD) WITH PROPOFOL N/A  12/11/2018  ? Procedure: ESOPHAGOGASTRODUODENOSCOPY (EGD) WITH PROPOFOL;  Surgeon: Lavena Bullion, DO;  Location: [facility];  Service: Gastroenterology;  Laterality: N/A;  ? MALONEY DILATION  12/11/2018  ? Procedure: MALONEY DILATION;  Surgeon: Lavena Bullion, DO;  Location: [facility];  Service: Gastroenterology;;  ? NASAL SINUS SURGERY    ? TRANSORAL INCISIONLESS  FUNDOPLICATION N/A 16/01/958  ? Procedure: TRANSORAL INCISIONLESS FUNDOPLICATION;  Surgeon: Lavena Bullion, DO;  Location: [facility];  Service: Gastroenterology;  Laterality: N/A;  ? ?Family History  ?Problem Relation Age of Onset  ? Other Mother   ? Other Father   ? Cancer Father   ?     small cell lung cancer-limited   ? Diabetes Maternal Grandmother   ? Colon cancer Neg Hx   ? Esophageal cancer Neg Hx   ? ?Social History  ? ?Tobacco Use  ? Smoking status: Former  ?  Packs/day: 0.50  ?  Years: 20.00  ?  Pack years: 10.00  ?  Types: Cigarettes  ?  Quit date: 01/09/2016  ?  Years since quitting: 5.3  ? Smokeless tobacco: Never  ?Vaping Use  ? Vaping Use: Former  ?Substance Use Topics  ? Alcohol use: Yes  ?  Alcohol/week: 2.0 standard drinks  ?  Types: 2 Cans of beer per week  ?  Comment: occas  ? Drug use: No  ? ?Current Facility-Administered Medications  ?Medication Dose Route Frequency Provider Last Rate Last Admin  ? 0.9 %  sodium chloride infusion   Intravenous Continuous Minela Bridgewater V, DO      ? bupivacaine liposome (EXPAREL) 1.3 % injection 266 mg  20 mL Infiltration Once Greer Pickerel, MD      ? ceFAZolin (ANCEF) IVPB 2g/100 mL premix  2 g Intravenous On Call to OR Greer Pickerel, MD      ? Chlorhexidine Gluconate Cloth 2 % PADS 6 each  6 each Topical Once Greer Pickerel, MD      ? And  ? Chlorhexidine Gluconate Cloth 2 % PADS 6 each  6 each Topical Once Greer Pickerel, MD      ? dexamethasone (DECADRON) injection 4 mg  4 mg Intravenous On Call to OR Greer Pickerel, MD      ? Derrill Memo ON 05/02/2021] feeding supplement (ENSURE PRE-SURGERY) liquid 296 mL  296 mL Oral Once Greer Pickerel, MD      ? lactated ringers infusion   Intravenous Continuous Duane Boston, MD 10 mL/hr at 05/01/21 1101 New Bag at 05/01/21 1101  ? scopolamine (TRANSDERM-SCOP) 1 MG/3DAYS 1.5 mg  1 patch Transdermal On Call to OR Greer Pickerel, MD   1.5 mg at 05/01/21 1053  ? ?No Known Allergies ? ? ?Review of Systems: ?All systems reviewed and  negative except where noted in HPI.  ? ? ? ?Physical Exam:   ? ?Wt Readings from Last 3 Encounters:  ?05/01/21 56.2 kg  ?04/12/21 56.2 kg  ?03/06/21 56.7 kg  ? ? ?BP 112/78   Pulse 77   Temp 98.3 ?F (36.8 ?C) (Oral)   Resp 16   Ht 5' 2"  (1.575 m)   Wt 56.2 kg   SpO2 100%   BMI 22.66 kg/m?  ?Constitutional:  Pleasant, in no acute distress. ?Psychiatric: Normal mood and affect. Behavior is normal. ?EENT: Pupils normal.  Conjunctivae are normal. No scleral icterus. ?Cardiovascular: Normal rate, regular rhythm. No edema ?Pulmonary/chest: Effort normal and breath sounds normal. No wheezing, rales or rhonchi. ?Abdominal: Soft, nondistended, nontender. Bowel sounds active  throughout. There are no masses palpable. No hepatomegaly. ?Neurological: Alert and oriented to person place and time. ?Skin: Skin is warm and dry. No rashes noted. ? ? ?ASSESSMENT AND PLAN;  ? ?1) GERD with erosive esophagitis ?2) Hiatal hernia ?3) LES laxity ?- Plan for concomitant laparoscopic hiatal hernia repair with Dr. Redmond Pulling followed by Transoral Incisionless Fundoplication (cTIF) ?- Tentative plan for overnight admission for 23-hour postoperative observation ? ? ?Lavena Bullion, DO, FACG  05/01/2021, 12:35 PM ? ? ?No ref. provider found ? ?

## 2021-05-01 NOTE — H&P (Addendum)
?CC: here for surgery ? ?Requesting provider: Dr Bryan Lemma  ? ?HPI: ?Kristen Nichols is an 66 y.o. female who is here for laparoscopic repair of hiatal hernia with redo TIF.  The patient has a history of a transoral incisionless fundoplication December 7169.  She reported excellent resolution of her reflux.  But unfortunately starting last fall she started have recurrent symptoms of reflux. She states that she started to notice some symptoms in June 2022. It got progressively worse where she had to restart antaacids. She describes her symptoms as primarily epigastric and chest pressure and acid reflux. She states it is worse if she bends over or lays flat. She does not have as severe as burning as she did prior to any intervention. She does have an occasional sensation of something getting stuck more so with solids than liquids. She denies any pain with swallowing. She reports normal bowel movements. No prior abdominal surgeries other than C-sections and diagnostic laparoscopies. She is currently taking Carafate 4 times a day as well as Protonix twice a day. She is found that the Carafate helps control some of her symptoms ? ?She had manometry which showed normal esophageal motility.  She had repeat endoscopy and upper GI which showed a small sliding hiatal hernia and a loose wrap.  She was referred for evaluation for hiatal hernia repair with gastroenterology planning to redo a portion of her wrap ? ?Since I last saw her in February she states that she is now having some intermittent emesis as time ? ?Past Medical History:  ?Diagnosis Date  ? Allergy   ? Hayfever  ? Anxiety   ? Benign essential hypertension 10/13/2012  ? Last Assessment & Plan:  Well controlled.    ? Colitis   ? Diverticulosis   ? GERD (gastroesophageal reflux disease)   ? History of colon polyps   ? Hypertension   ? IBS (irritable bowel syndrome)   ? PONV (postoperative nausea and vomiting)   ? after ankle surgery in 1994  ? UC (ulcerative  colitis) ([place])   ? ? ?Past Surgical History:  ?Procedure Laterality Date  ? ANKLE SURGERY Left   ? pin in place   ? BREAST BIOPSY Right 2017  ? marker in place   ? BREAST CYST ASPIRATION Right 2016  ? CESAREAN SECTION    ? x2  ? COLONOSCOPY    ? has had 2 or 3 of them per patient. Last one was around 2016 with [facility] or maybe [facility] , last february 2020 now toal of 4   ? ESOPHAGEAL MANOMETRY N/A 03/01/2021  ? Procedure: ESOPHAGEAL MANOMETRY (EM);  Surgeon: Lavena Bullion, DO;  [facility];  Service: Gastroenterology;  Laterality: N/A;  ? ESOPHAGOGASTRODUODENOSCOPY (EGD) WITH PROPOFOL N/A 12/11/2018  ? Procedure: ESOPHAGOGASTRODUODENOSCOPY (EGD) WITH PROPOFOL;  Surgeon: Lavena Bullion, DO;  [facility]: [facility];  Service: Gastroenterology;  Laterality: N/A;  ? MALONEY DILATION  12/11/2018  ? Procedure: MALONEY DILATION;  Surgeon: Lavena Bullion, DO;  Location: [facility];  Service: Gastroenterology;;  ? NASAL SINUS SURGERY    ? TRANSORAL INCISIONLESS FUNDOPLICATION N/A 67/08/9379  ? Procedure: TRANSORAL INCISIONLESS FUNDOPLICATION;  Surgeon: Lavena Bullion, DO;  [facility];  Service: Gastroenterology;  Laterality: N/A;  ? ? ?Family History  ?Problem Relation Age of Onset  ? Other Mother   ? Other Father   ? Cancer Father   ?     small cell lung cancer-limited   ? Diabetes Maternal Grandmother   ?  Colon cancer Neg Hx   ? Esophageal cancer Neg Hx   ? ? ?Social:  reports that she quit smoking about 5 years ago. Her smoking use included cigarettes. She has a 10.00 pack-year smoking history. She has never used smokeless tobacco. She reports current alcohol use of about 2.0 standard drinks per week. She reports that she does not use drugs. ? ?Allergies: No Known Allergies ? ?Medications: I have reviewed the patient's current medications. ? ? ?ROS - all of the below systems have been reviewed with the patient and positives are indicated with bold text ?General: chills, fever or  night sweats ?Eyes: blurry vision or double vision ?ENT: epistaxis or sore throat ?Allergy/Immunology: itchy/watery eyes or nasal congestion ?Hematologic/Lymphatic: bleeding problems, blood clots or swollen lymph nodes ?Endocrine: temperature intolerance or unexpected weight changes ?Breast: new or changing breast lumps or nipple discharge ?Resp: cough, shortness of breath, or wheezing ?CV: chest pain or dyspnea on exertion ?GI: as per HPI ?GU: dysuria, trouble voiding, or hematuria ?MSK: joint pain or joint stiffness ?Neuro: TIA or stroke symptoms ?Derm: pruritus and skin lesion changes ?Psych: anxiety and depression ? ?PE ?Blood pressure 112/78, pulse 77, temperature 98.3 ?F (36.8 ?C), temperature source Oral, resp. rate 16, height 5' 2"  (1.575 m), weight 56.2 kg, SpO2 100 %. ?Constitutional: NAD; conversant; no deformities ?Eyes: Moist conjunctiva; no lid lag; anicteric; PERRL ?Neck: Trachea midline; no thyromegaly ?Lungs: Normal respiratory effort; no tactile fremitus ?CV: RRR; no palpable thrills; no pitting edema ?GI: Abd soft, nontender, nondistended; no palpable hepatosplenomegaly ?MSK: Normal gait; no clubbing/cyanosis ?Psychiatric: Appropriate affect; alert and oriented x3 ?Lymphatic: No palpable cervical or axillary lymphadenopathy ?Skin: No rash, lesions or jaundice ? ?No results found for this or any previous visit (from the past 48 hour(s)). ? ?No results found. ? ?Imaging: ?Reviewed upper endoscopy, manometry and upper GI ? ?A/P: ?Kristen Nichols is an 66 y.o. female with ?Sliding hiatal hernia and GERD ?History of TIF procedure December 2020 ? ?2 OR for laparoscopic repair of hiatal hernia with concomitant redo TIF by gastroenterology along with upper endoscopy ? ?I think it is possible portion of her TIF wrap has herniated above her diaphragm.  Plan would be to mobilize the distal esophagus and proximal stomach in her wrap and to make sure that her wrap is below her diaphragm along with several  centimeters of esophagus and then closed the diaphragm in typical fashion. ? ?We discussed with patient the steps of the procedure. ?IV antibiotic on-call ?Preoperative subcu heparin ? ?All questions asked and answered.  We discussed the typical recovery course. ? ?Leighton Ruff. Redmond Pulling, MD, FACS ?General, Bariatric, & Minimally Invasive Surgery ?[facility], [place] ? ?

## 2021-05-01 NOTE — Anesthesia Procedure Notes (Signed)
Procedure Name: Intubation ?Date/Time: 05/01/2021 1:12 PM ?Performed by: Maxwell Caul, CRNA ?Pre-anesthesia Checklist: Patient identified, Emergency Drugs available, Suction available and Patient being monitored ?Patient Re-evaluated:Patient Re-evaluated prior to induction ?Oxygen Delivery Method: Circle system utilized ?Preoxygenation: Pre-oxygenation with 100% oxygen ?Induction Type: IV induction ?Ventilation: Mask ventilation without difficulty ?Laryngoscope Size: Mac and 4 ?Grade View: Grade II ?Tube type: Oral ?Number of attempts: 1 ?Airway Equipment and Method: Stylet ?Placement Confirmation: ETT inserted through vocal cords under direct vision, positive ETCO2 and breath sounds checked- equal and bilateral ?Secured at: 21 cm ?Tube secured with: Tape ?Dental Injury: Teeth and Oropharynx as per pre-operative assessment  ?Comments: Emt intubated ? ? ? ? ?

## 2021-05-01 NOTE — Anesthesia Postprocedure Evaluation (Signed)
Anesthesia Post Note ? ?Patient: [name] ? ?Procedure(s) Performed: LAPAROSCOPIC REPAIR OF HIATAL HERNIA ?ESOPHAGOGASTRODUODENOSCOPY (EGD) ?TRANSORAL INCISIONLESS FUNDOPLICATION ?SAVORY DILATION ? ?  ? ?Patient location during evaluation: PACU ?Anesthesia Type: General ?Level of consciousness: awake and alert ?Pain management: pain level controlled ?Vital Signs Assessment: post-procedure vital signs reviewed and stable ?Respiratory status: spontaneous breathing, nonlabored ventilation, respiratory function stable and patient connected to nasal cannula oxygen ?Cardiovascular status: blood pressure returned to baseline and stable ?Postop Assessment: no apparent nausea or vomiting ?Anesthetic complications: no ? ? ?No notable events documented. ? ?Last Vitals:  ?Vitals:  ? 05/01/21 1645 05/01/21 1701  ?BP: (!) 150/81 (!) 147/87  ?Pulse: 82 83  ?Resp: 13 18  ?Temp: 36.7 ?C 37.1 ?C  ?SpO2: 100% 98%  ?  ?Last Pain:  ?Vitals:  ? 05/01/21 1701  ?TempSrc: Oral  ?PainSc:   ? ? ?  ?  ?  ?  ?  ?  ? ?Kristen Nichols ? ? ? ? ?

## 2021-05-02 ENCOUNTER — Encounter (HOSPITAL_COMMUNITY): Payer: Self-pay | Admitting: General Surgery

## 2021-05-02 ENCOUNTER — Telehealth: Payer: Self-pay | Admitting: Gastroenterology

## 2021-05-02 DIAGNOSIS — Z8719 Personal history of other diseases of the digestive system: Secondary | ICD-10-CM | POA: Diagnosis not present

## 2021-05-02 DIAGNOSIS — I10 Essential (primary) hypertension: Secondary | ICD-10-CM | POA: Diagnosis not present

## 2021-05-02 DIAGNOSIS — K219 Gastro-esophageal reflux disease without esophagitis: Secondary | ICD-10-CM | POA: Diagnosis not present

## 2021-05-02 DIAGNOSIS — Z87891 Personal history of nicotine dependence: Secondary | ICD-10-CM | POA: Diagnosis not present

## 2021-05-02 DIAGNOSIS — K449 Diaphragmatic hernia without obstruction or gangrene: Secondary | ICD-10-CM | POA: Diagnosis not present

## 2021-05-02 MED ORDER — METOCLOPRAMIDE HCL 10 MG PO TABS: 10.0000 mg | ORAL_TABLET | Freq: Four times a day (QID) | ORAL | 1 refills | Status: DC | PRN

## 2021-05-02 MED ORDER — ONDANSETRON HCL 4 MG PO TABS: 4.0000 mg | ORAL_TABLET | Freq: Four times a day (QID) | ORAL | 1 refills | Status: DC | PRN

## 2021-05-02 MED ORDER — SIMETHICONE 80 MG PO CHEW: 80.0000 mg | CHEWABLE_TABLET | Freq: Four times a day (QID) | ORAL | 0 refills | Status: DC | PRN

## 2021-05-02 MED ORDER — ACETAMINOPHEN-CODEINE 120-12 MG/5ML PO SOLN: 5.0000 mL | ORAL | 0 refills | Status: AC | PRN

## 2021-05-02 NOTE — Telephone Encounter (Signed)
Patient states she was suppose to get Tylenol w\codeine but the pharmacy did not have it. ?

## 2021-05-02 NOTE — Telephone Encounter (Signed)
Called pt to let her know tylenol #3 was sent to pharmacy. Pt verbalized understanding.  ?

## 2021-05-02 NOTE — Discharge Summary (Signed)
[facility] DISCHARGE SUMMARY ? ?Date of admission: 05/01/2021 ?Date of discharge: 05/02/2021 ?Attending: Dr. Bryan Lemma ?Primary Care provider: Emeterio Reeve, DO ?Discharge diagnosis: GERD, hiatal hernia ?Consultations: None ?Procedures performed: Laparoscopic hiatal hernia repair, Endoscopic Fundoplication (TIF), EGD with Savary dilation ?History: See admission H&P ?Exam: See below ? ?Hospital course: ?1) GERD ?2) Hiatal hernia ?Kristen Nichols is a 66 y.o. female s/p laparoscopic hiatal hernia repair/crural repair and EGD with Transoral Incisionless Fundoplication (TIF) completed on 1/61/0960 without complications. Did well overnight, without any acute events.  Tolerating liquid diet and oral medications without issue. ? ?Discharge vital signs: See below ?Discharge labs: None ?Disposition: Home in stable condition ?Follow-up appointments: See below ? ?Day of Discharge:  ?Did well overnight, without any acute events.  Tolerating liquid diet and oral medications without issue. ? ?Objective: ?Vital signs in last 24 hours: ?Temp:  [97.6 ?F (36.4 ?C)-98.7 ?F (37.1 ?C)] 98.5 ?F (36.9 ?C) (04/25 4540) ?Pulse Rate:  [72-89] 89 (04/25 9811) ?Resp:  [12-19] 18 (04/25 9147) ?BP: (112-157)/(73-94) 149/78 (04/25 8295) ?SpO2:  [92 %-100 %] 94 % (04/25 6213) ?Weight:  [56.2 kg] 56.2 kg (04/24 1028) ?Last BM Date : 05/01/21 ?General: NAD ?Lungs:  CTA b/l, no w/r/r ?Heart:  RRR, no m/r/g ?Abdomen:  Mild expected post operative TTP in epigastrium without rebound or guarding, no peritoneal signs. Otherwise, soft, ND, +BS ?Ext:  No c/c/e ? ? ? ?Intake/Output from previous day: ?04/24 0701 - 04/25 0700 ?In: 2313.3 [P.O.:600; I.V.:1613.3; IV Piggyback:100] ?Out: 1330 [Urine:1300; Blood:30] ?Intake/Output this shift: ?No intake/output data recorded. ? ? ?Lab Results: ?No results for input(s): WBC, HGB, PLT, MCV in the last 72 hours. ?BMET ?No results for input(s): NA, K, CL, CO2, GLUCOSE, BUN, CREATININE, CALCIUM in  the last 72 hours. ?LFT ?No results for input(s): PROT, ALBUMIN, AST, ALT, ALKPHOS, BILITOT, BILIDIR, IBILI in the last 72 hours. ?PT/INR ?No results for input(s): INR in the last 72 hours. ? ? ? ?Imaging/Other results: ?No results found. ? ? ? ?Assessment and Plan: ? ?Kristen Nichols is a 66 y.o. female s/p EGD with Transoral Incisionless Fundoplication (TIF) completed yesterday with no events on overnight observation. Will plan on d/c to home today with the following plan:  ? ?- Protonix 40 mg PO BID for 2 weeks, then 40 mg daily for 2 weeks, then 20 mg daily for 1 week, then prn  ?- Discharge with Zofran 4 mg PO prn Q6 hours for nausea  ?- Discharge with Reglan 10 mg PO prn Q6 hours for nausea  ?- Discharge with Simethicone 80 mg PO prn Q6 hours for bloating/abdominal discomfort ?- OTC Tylenol as needed for mild postoperative pain ?-Tylenol #3 prescribed for moderate to severe pain ?-Use Colace 100 mg BID as needed if using Tylenol #3 ? ?Diet:  ?- 2 weeks of liquid soft foods followed by 4 weeks slowly progressive diet back to regular  ?- Provided with handout for post operative diet plan  ? ?Post Op Activity:  ?- Week 1: encourage short distance walking, minimal physical activity, no lifting >5 lbs  ?- Week 2: Slow climbing stairs, no intense exercise, no lifting >5 lbs  ?- Week 3-6: No intense exercise, may lift up to 25 lbs  ?- Week 7: Resume normal activity  ? ?- To follow-up with me on 05/15/2021 at 9:00 AM in the [facility] ?-To follow-up with Dr. Redmond Pulling in the [facility] on 05/24/2021 at 8:30 AM ? ? ? ?Lavena Bullion, DO  05/02/2021,  7:30 AM ?[facility] ?Pager [phone] ? ?

## 2021-05-02 NOTE — Progress Notes (Signed)
Discharge instructions given to patient and all questions were answered.  

## 2021-05-02 NOTE — Telephone Encounter (Signed)
There was an issue with the computer approval, but should be all fixed now. Just sent in Rx to her pharmacy in [place]. Please call to let her know. Thanks! ?

## 2021-05-04 ENCOUNTER — Telehealth: Payer: Self-pay | Admitting: Gastroenterology

## 2021-05-04 NOTE — Telephone Encounter (Signed)
Spoke with pt. She will come to [place] office to fill out intake and release of information forms for her short term disability application.  ?

## 2021-05-15 ENCOUNTER — Encounter: Payer: Self-pay | Admitting: Gastroenterology

## 2021-05-15 ENCOUNTER — Ambulatory Visit (INDEPENDENT_AMBULATORY_CARE_PROVIDER_SITE_OTHER): Payer: Federal, State, Local not specified - PPO | Admitting: Gastroenterology

## 2021-05-15 VITALS — BP 104/70 | HR 87 | Ht 62.0 in | Wt 120.4 lb

## 2021-05-15 DIAGNOSIS — K2100 Gastro-esophageal reflux disease with esophagitis, without bleeding: Secondary | ICD-10-CM | POA: Diagnosis not present

## 2021-05-15 DIAGNOSIS — R140 Abdominal distension (gaseous): Secondary | ICD-10-CM

## 2021-05-15 DIAGNOSIS — Z9889 Other specified postprocedural states: Secondary | ICD-10-CM

## 2021-05-15 NOTE — Progress Notes (Signed)
? ?Chief Complaint:    Postoperative follow-up ? ?GI History:  66 year old female initially seen by me on 02/19/2018 for alternating bowel habits, abdominal pain, and reflux symptoms, now s/p Transoral Incisionless Fundoplication (TIF) 11/0/3159. ?  ?Longstanding history of GERD, complicated by erosive esophagitis. Index symptoms of heartburn and regurgitation, belch. No dysphagia or odynophagia.  Previous intolerance to omeprazole daily (nausea), so she was only taking intermittently.   EGD in 02/2018 with LA grade A esophagitis, 1 cm hiatal hernia with a Hill grade 2 valve.  Prescribed Prilosec 20 mg p.o. twice daily x8 weeks with good clinical response to therapy, but still with intermittent breakthrough sxs. She eventually tritrated off due to nausea with PPI. Completed TIF on 12/11/2018 with resolution of index reflux symptoms.  Had titrated off all acid suppression therapy. ?  ?Started having mild, intermittent heartburn and rare regurgitation in 04/2020.  Symptoms started increasing in 11/2020 with daily heartburn and regurgitation.  Restarted Protonix 40 mg bid with good symptom control.  Repeat upper endoscopy in 12/2020 with erosive esophagitis, significant LES laxity with suspicion of hernia recurrence.  Underwent concomitant laparoscopic hiatal hernia repair and repeat TIF on 05/01/2021. ?  ?Endoscopic history: ?- Concomitant laparoscopic hiatal hernia repair and TIF (05/01/2021): LA Grade A esophagitis.  12 additional fasteners placed ?- EGD (12/2020, Dr. Bryan Lemma): LA Grade A esophagitis with single mucosal break, Hill grade 3 valve.  Normal stomach/duodenum. ?-EGD with Transoral Incisionless Fundoplication (45/8592, Dr. Bryan Lemma): LA Grade A esophagitis, 1 cm HH, 22 fasteners placed with 270 degree valve 3 cm in length ?-EGD (2020, Dr. Bryan Lemma): 1 cm hiatal hernia, LA grade A esophagitis, Hill grade 2 valve, normal stomach and duodenum. ?-Colonoscopy (2020, Dr. Bryan Lemma): 3 small, 2 to 3 mm,  Hyperplastic polyps, sigmoid diverticulosis, otherwise normal with biopsies negative for [facility].  Recommended repeat in 10 years. ?-Colonoscopy (2014, Dr. Harl Bowie at [facility]): 5 mm polyp in cecum and 3 mm polyp in transverse colon (path polypoid colonic mucosa with benign reactive lymphoid hyperplasia), normal TI, sigmoid diverticulosis ?-Colonoscopy (2012, [place]): No report available for review.  Patient reports polyps ? ?HPI:   ? ? ?Patient is a 66 y.o. female presenting to the Gastroenterology Clinic for postoperative follow-up after cTIF on 05/01/2021.  ? ?Tolerating postoperative diet without any issues.  Moving up to stage II diet today.  No breakthrough reflux symptoms.  Some postoperative bloating, which resolves quickly with simethicone.  Overall, she is very happy with her recent surgery. ? ? ?Review of systems:     No chest pain, no SOB, no fevers, no urinary sx  ? ?Past Medical History:  ?Diagnosis Date  ? Allergy   ? Hayfever  ? Anxiety   ? Benign essential hypertension 10/13/2012  ? Last Assessment & Plan:  Well controlled.    ? Colitis   ? Diverticulosis   ? GERD (gastroesophageal reflux disease)   ? History of colon polyps   ? Hypertension   ? IBS (irritable bowel syndrome)   ? PONV (postoperative nausea and vomiting)   ? after ankle surgery in 1994  ? UC (ulcerative colitis) ([place])   ? ? ?Patient's surgical history, family medical history, social history, medications and allergies were all reviewed in Epic  ? ? ?Current Outpatient Medications  ?Medication Sig Dispense Refill  ? buPROPion (WELLBUTRIN XL) 150 MG 24 hr tablet TAKE 1 TABLET BY MOUTH EVERY DAY 90 tablet 3  ? fexofenadine-pseudoephedrine (ALLEGRA-D) 60-120 MG 12 hr tablet Take 1 tablet by  mouth daily.    ? FLUoxetine (PROZAC) 20 MG tablet TAKE 1 TABLET BY MOUTH EVERY DAY 90 tablet 3  ? fluticasone (FLONASE) 50 MCG/ACT nasal spray SPRAY 2 SPRAYS INTO EACH NOSTRIL EVERY DAY. NO REFILLS. NEEDS TO TRANSFER CARE TO NEW PCP.  (Patient taking differently: Place 2 sprays into both nostrils 2 (two) times daily as needed for allergies.) 48 mL 0  ? hydrochlorothiazide (HYDRODIURIL) 25 MG tablet Take 1 tablet (25 mg total) by mouth daily. NO REFILLS. NEEDS TO TRANSFER CARE TO NEW PCP 90 tablet 0  ? metoCLOPramide (REGLAN) 10 MG tablet Take 1 tablet (10 mg total) by mouth every 6 (six) hours as needed for nausea or vomiting. 30 tablet 1  ? ondansetron (ZOFRAN) 4 MG tablet Take 1 tablet (4 mg total) by mouth every 6 (six) hours as needed for nausea or vomiting. 30 tablet 1  ? pantoprazole (PROTONIX) 40 MG tablet Take 1 tablet (40 mg total) by mouth 2 (two) times daily. 60 tablet 3  ? polyethylene glycol (MIRALAX / GLYCOLAX) 17 g packet Take 17 g by mouth daily.    ? Propylene Glycol (SYSTANE BALANCE) 0.6 % SOLN Place 1 drop into both eyes 2 (two) times daily as needed (dry/irritated eyes).    ? simethicone (MYLICON) 80 MG chewable tablet Chew 1 tablet (80 mg total) by mouth every 6 (six) hours as needed for flatulence (gas, bloating, abdominal discomfort). 30 tablet 0  ? sucralfate (CARAFATE) 1 GM/10ML suspension Take 10 mLs (1 g total) by mouth 4 (four) times daily. 420 mL 1  ? ?No current facility-administered medications for this visit.  ? ? ?Physical Exam:   ? ? ?BP 104/70   Pulse 87   Ht 5' 2"  (1.575 m)   Wt 120 lb 6 oz (54.6 kg)   BMI 22.02 kg/m?  ? ?GENERAL:  Pleasant female in NAD ?PSYCH: : Cooperative, normal affect ?EENT:  conjunctiva pink, mucous membranes moist, neck supple without masses ?ABDOMEN: Postoperative incision dressings  c/d/I.  No ecchymosis.  Minimal TTP, but expected for postoperative course.  No peritoneal signs, rebound, guarding.  Nondistended, soft ?Musculoskeletal:  Normal muscle tone, normal strength ?NEURO: Alert and oriented x 3, no focal neurologic deficits ? ? ?IMPRESSION and PLAN:   ? ?1) GERD ?2) Hiatal hernia-surgically corrected ?3) Fundoplication ?4) Abdominal bloating ? ?66 year old female now s/p  concomitant laparoscopic hiatal hernia repair/crural repair and Transoral Incisionless Fundoplication (cTIF).  She is doing well postoperatively. ? ?- Continue advancing diet slowly per postoperative protocol ?- Continues advancing exercise/activity as tolerated per postoperative protocol ?- Can start weaning Protonix to 40 mg/day x2 weeks then 40 mg QOD, the prn ?- Continue simethicone as needed ?    ?RTC in 1 year or sooner as needed ?    ? ?Lavena Bullion ,DO, FACG 05/15/2021, 9:08 AM ? ?

## 2021-05-15 NOTE — Patient Instructions (Addendum)
If you are age 64 or younger, your body mass index should be between 19-25. Your There is no height or weight on file to calculate BMI. If this is out of the aformentioned range listed, please consider follow up with your Primary Care Provider.  ? ?__________________________________________________________ ? ?The [facility] providers would like to encourage you to use [facility] to communicate with providers for non-urgent requests or questions.  Due to long hold times on the telephone, sending your provider a message by [facility] may be a faster and more efficient way to get a response.  Please allow 48 business hours for a response.  Please remember that this is for non-urgent requests.  ? ?Due to recent changes in healthcare laws, you may see the results of your imaging and laboratory studies on MyChart before your provider has had a chance to review them.  We understand that in some cases there may be results that are confusing or concerning to you. Not all laboratory results come back in the same time frame and the provider may be waiting for multiple results in order to interpret others.  Please give [place] 48 hours in order for your provider to thoroughly review all the results before contacting the office for clarification of your results.  ? ?Please follow up in 1 year. ? ?Thank you for choosing me and [facility]. ? ?Gerrit Heck, D.O. ? ? ? ?We want to thank you for trusting [facility] [place] [facility] with your care. All of our staff and providers value the relationships we have built with our patients, and it is an honor to care for you.  ? ?We are writing to let you know that [facility] [place] will close on May 22, 2021, and we invite you to continue to see Dr. Carmell Austria and Gerrit Heck at the [facility] [place] office location. We are consolidating our serices at these [facility] practices to better provide care. Our office staff will work with you to  ensure a seamless transition.  ? ?Gerrit Heck, DO -Dr. Bryan Lemma will be movig to [facility] at [address] [place], [place] [address], effective May 22, 2021.  Contact [phone] to schedule an appointment with him.  ? ?Carmell Austria, MD- Dr. Lyndel Safe will be movig to [facility] at [address], [place], [place] [address], effective May 22, 2021.  Contact [phone] to schedule an appointment with him.  ? ?Requesting Medical Records ?If you need to request your medical records, please follow the instructions below. Your medical records are confidential, and a copy can be transferred to another provider or released to you or another person you designate only with your permission. ? ?There are several ways to request your medical records: ?Requests for medical records can be submitted through our practice.   ?You can also request your records electronically, in your MyChart account by selecting the ?Request Health Records? tab.  ?If you need additional information on how to request records, please go to [email or web address]/, choose Patient Information, then select Request Medical Records. ?To make an appointment or if you have any questions about your health care needs, please contact our office at [phone] and one of our staff members will be glad to assist you. ?[facility] is committed to providing exceptional care for you and our community. Thank you for allowing [place] to serve your health care needs. ?Sincerely, ? ?Windy Canny, Director [facility] ?[facility] also offers convenient virtual care options. Sore throat? Sinus  problems? Cold or flu symptoms? Get care from the comfort of home with [facility] Video Visits and e-Visits. Learn more about the non-emergency conditions treated and start your virtual visit at [email or web address] ? ?

## 2021-05-17 NOTE — Telephone Encounter (Signed)
FMLA paperwork given to [facility] to have Dr. Bryan Lemma to fill out. ?

## 2021-05-19 NOTE — Telephone Encounter (Signed)
Spoke with pt to come to the office to fill out her portion of the forms so we can fax them over. She will come on Monday. ?

## 2021-05-22 DIAGNOSIS — Z0279 Encounter for issue of other medical certificate: Secondary | ICD-10-CM

## 2021-06-21 ENCOUNTER — Other Ambulatory Visit: Payer: Self-pay | Admitting: Sports Medicine

## 2021-06-22 NOTE — Telephone Encounter (Signed)
V.Mail left for patient to return call and set appt for Transfer of Care for further refills.

## 2021-06-25 ENCOUNTER — Other Ambulatory Visit: Payer: Self-pay | Admitting: Osteopathic Medicine

## 2021-06-27 ENCOUNTER — Ambulatory Visit
Admission: RE | Admit: 2021-06-27 | Discharge: 2021-06-27 | Disposition: A | Discharge status: Home / Self Care | Payer: Federal, State, Local not specified - PPO | Source: Ambulatory Visit | Attending: Osteopathic Medicine | Admitting: Osteopathic Medicine

## 2021-06-27 DIAGNOSIS — Z1231 Encounter for screening mammogram for malignant neoplasm of breast: Secondary | ICD-10-CM | POA: Diagnosis not present

## 2021-09-05 NOTE — Progress Notes (Unsigned)
     Established patient visit   Patient: Kristen Nichols   DOB: [birth year]   66 y.o. Female  MRN: [id number] Visit Date: 09/06/2021  Today's healthcare provider: Owens Loffler, DO   No chief complaint on file.   SUBJECTIVE   No chief complaint on file.  HPI    Review of Systems     No outpatient medications have been marked as taking for the 09/06/21 encounter (Appointment) with Owens Loffler, DO.    OBJECTIVE    There were no vitals taken for this visit.  Physical Exam   {Show previous labs (optional):23736}    ASSESSMENT & PLAN    Problem List Items Addressed This Visit   None   No follow-ups on file.      No orders of the defined types were placed in this encounter.   No orders of the defined types were placed in this encounter.    Owens Loffler, DO  [facility] [phone] (phone) [phone] (fax)  [place]

## 2021-09-06 ENCOUNTER — Ambulatory Visit: Payer: Federal, State, Local not specified - PPO | Admitting: Family Medicine

## 2021-09-06 ENCOUNTER — Encounter: Payer: Self-pay | Admitting: Family Medicine

## 2021-09-06 VITALS — BP 116/82 | HR 86 | Ht 62.0 in | Wt 117.0 lb

## 2021-09-06 DIAGNOSIS — F419 Anxiety disorder, unspecified: Secondary | ICD-10-CM | POA: Insufficient documentation

## 2021-09-06 DIAGNOSIS — I10 Essential (primary) hypertension: Secondary | ICD-10-CM

## 2021-09-06 DIAGNOSIS — M79676 Pain in unspecified toe(s): Secondary | ICD-10-CM | POA: Insufficient documentation

## 2021-09-06 MED ORDER — FLUOXETINE HCL 20 MG PO TABS: ORAL_TABLET | ORAL | 3 refills | Status: DC

## 2021-09-06 MED ORDER — BUPROPION HCL ER (XL) 150 MG PO TB24: 150.0000 mg | ORAL_TABLET | Freq: Every day | ORAL | 3 refills | Status: DC

## 2021-09-06 MED ORDER — HYDROCHLOROTHIAZIDE 12.5 MG PO TABS: 12.5000 mg | ORAL_TABLET | Freq: Every day | ORAL | 3 refills | Status: DC

## 2021-09-06 NOTE — Patient Instructions (Signed)
Look for a wider toe box for shoes

## 2021-09-06 NOTE — Assessment & Plan Note (Addendum)
-   well controlled will refill wellbutrin and prozac  - have referred to behavioral health for therapy to discuss her daughter in recovery and how she is being disowned by her family.

## 2021-09-06 NOTE — Assessment & Plan Note (Signed)
-   pt is using voltaren gel and will continue to use  - on exam no erythema or swelling - toes almost looked smushed and she could probably benefit from a wider toe box - have sent referral to podiatry for wider toe box eval

## 2021-09-06 NOTE — Assessment & Plan Note (Signed)
-   BP at 116 SBPs. No family history of htn and she tells me she has lost a lot of weight and changed her diet. We will try and decrease her hctz to 12.22m to see how her BP tolerates it. Without family hx of bp and weight loss I believe we can try and get her off this BP medication

## 2021-09-22 ENCOUNTER — Encounter: Payer: Self-pay | Admitting: Podiatry

## 2021-09-22 ENCOUNTER — Ambulatory Visit: Payer: Federal, State, Local not specified - PPO | Admitting: Podiatry

## 2021-09-22 ENCOUNTER — Ambulatory Visit (INDEPENDENT_AMBULATORY_CARE_PROVIDER_SITE_OTHER): Payer: Federal, State, Local not specified - PPO

## 2021-09-22 DIAGNOSIS — G5761 Lesion of plantar nerve, right lower limb: Secondary | ICD-10-CM

## 2021-09-22 DIAGNOSIS — M79674 Pain in right toe(s): Secondary | ICD-10-CM

## 2021-09-22 MED ORDER — MELOXICAM 15 MG PO TABS: 15.0000 mg | ORAL_TABLET | Freq: Every day | ORAL | 0 refills | Status: DC

## 2021-09-22 NOTE — Progress Notes (Signed)
  Subjective:  Patient ID: Kristen Nichols, female    DOB: 1955-09-13,   MRN: [id number]  Chief Complaint  Patient presents with   Toe Pain     NP R pain in 3 toes (3rd, 4th and 5th toes)  - no known injury  - not diabetic    66 y.o. female presents for concern of right foot pain that has been going on for years. Relates it is mostly her third fourth and fifth toes. Relates any tight fitting shoes are very painful. Relates pain comes and goes and when bad cannot walk on the foot. Has tried Voltaren gel which helps when in pain. Denies any other pedal complaints. Denies n/v/f/c.   Past Medical History:  Diagnosis Date   Allergy    Hayfever   Anxiety    Benign essential hypertension 10/13/2012   Last Assessment & Plan:  Well controlled.     Colitis    Diverticulosis    GERD (gastroesophageal reflux disease)    History of colon polyps    Hypertension    IBS (irritable bowel syndrome)    PONV (postoperative nausea and vomiting)    after ankle surgery in 1994   UC (ulcerative colitis) ([facility])     Objective:  Physical Exam: Vascular: DP/PT pulses 2/4 bilateral. CFT <3 seconds. Normal hair growth on digits. No edema.  Skin. No lacerations or abrasions bilateral feet.  Musculoskeletal: MMT 5/5 bilateral lower extremities in DF, PF, Inversion and Eversion. Deceased ROM in DF of ankle joint. Tender to third interspace on the right and pain along the third and fourth MPJs. Positive mulders click and pain with metatarsal squeeze.  Neurological: Sensation intact to light touch.   Assessment:   1. Morton's neuroma of right foot      Plan:  Patient was evaluated and treated and all questions answered. Discussed neuroma and treatment options with patient.  Radiographs reviewed and discussed with patient.  No acute fractures or dislocations.  Injection deferred today.  Discussed padding and offloading today.  Prescription for meloxicam provided.  Discussed if pain does not improve may  consider  MRI for further surgical planning.  Patient to return in 6 weeks or sooner if concerns arise.     Lorenda Peck, DPM

## 2021-10-04 ENCOUNTER — Ambulatory Visit: Payer: Federal, State, Local not specified - PPO

## 2021-10-05 ENCOUNTER — Encounter: Payer: Self-pay | Admitting: Professional

## 2021-10-05 ENCOUNTER — Ambulatory Visit (INDEPENDENT_AMBULATORY_CARE_PROVIDER_SITE_OTHER): Payer: Federal, State, Local not specified - PPO | Admitting: Professional

## 2021-10-05 DIAGNOSIS — F411 Generalized anxiety disorder: Secondary | ICD-10-CM | POA: Diagnosis not present
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
## 2021-10-05 NOTE — Progress Notes (Signed)
k

## 2021-10-10 ENCOUNTER — Ambulatory Visit: Payer: Federal, State, Local not specified - PPO

## 2021-10-13 ENCOUNTER — Ambulatory Visit (INDEPENDENT_AMBULATORY_CARE_PROVIDER_SITE_OTHER): Payer: Federal, State, Local not specified - PPO | Admitting: Sports Medicine

## 2021-10-13 ENCOUNTER — Ambulatory Visit (INDEPENDENT_AMBULATORY_CARE_PROVIDER_SITE_OTHER): Payer: Federal, State, Local not specified - PPO | Admitting: Professional

## 2021-10-13 ENCOUNTER — Encounter: Payer: Self-pay | Admitting: Professional

## 2021-10-13 VITALS — BP 112/73 | HR 83 | Ht 62.0 in | Wt 117.0 lb

## 2021-10-13 DIAGNOSIS — I10 Essential (primary) hypertension: Secondary | ICD-10-CM | POA: Diagnosis not present

## 2021-10-13 DIAGNOSIS — F411 Generalized anxiety disorder: Secondary | ICD-10-CM | POA: Diagnosis not present

## 2021-10-13 NOTE — Progress Notes (Signed)
° ° ° ° ° ° ° ° ° ° ° ° ° ° °  Biddie Sebek, LCMHC °

## 2021-10-13 NOTE — Progress Notes (Signed)
Patient is here for blood pressure check.   Previous BP was 116/82  1st BP today: 112/73  Denies chest pain, dizziness, shortness of breath, severe headache, or nosebleeds. Taking medication as prescribed. Denies missed doses.    No major side effects since lowering dosage other than a slightly higher pulse rate.   Pt advised to keep regularly scheduled appt.

## 2021-10-13 NOTE — Progress Notes (Addendum)
[facility] Counselor/Therapist Progress Note  Patient ID: Kristen Nichols, MRN: [id number],    Date: 10/13/2021  Time Spent: 58 minutes 10-1058am  Treatment Type: Individual Therapy  Risk Assessment: Danger to Self:  No Self-injurious Behavior: No Danger to Others: No  Subjective: The patient arrived on time for her in person appointment.  Issues addressed: 1-communication -difficulty created in personal relationships -passive, assertive, aggressive -how to respond to build healthy relationships vs. unhealthy ones -pt able to identify missed steps in personal communication that has impacted   -relationship with son, and two sisters 2-treatment planning -patient and Clinician completed treatment plan -patient fully participated and is in agreement with her treatment plan  Treatment Plan Problems Addressed  Anxiety, Family Conflict Goals 1. Achieve a reasonable level of family connectedness and harmony where members support, help, and are concerned for each other. 2. Decrease the level of present conflict with siblings while beginning to let go of or resolving past conflicts with them. Objective Identify own as well as others' role in the family conflicts. Target Date: 2022-10-14 Frequency: Weekly  Progress: 0 Modality: individual  Related Interventions Confront the client when he/she is not taking responsibility for his/her role in the family conflict and reinforce the client for owning responsibility for his/her contribution to the conflict. Objective Describe the conflicts and the causes of conflicts between self, son, and siblings. Target Date: 2022-10-14 Frequency: Weekly  Progress: 0 Modality: individual  Related Interventions Give verbal permission for the client to have and express own feelings, thoughts, and perspectives in order to foster a sense of autonomy from family. Explore the nature of the client's family conflicts and their perceived  causes. Objective Increase the number of positive family interactions by planning activities. Target Date: 2022-10-14 Frequency: Weekly  Progress: 0 Modality: individual  Related Interventions Assist the client in developing a list of positive family activities that promote harmony (e.g., bowling, fishing, playing table games, doing work projects). Schedule such activities into the family calendar. Objective Report an increase in resolving conflicts with siblings by talking calmly and assertively rather than aggressively and defensively. Target Date: 2022-10-14 Frequency: Weekly  Progress: 0 Modality: individual  Related Interventions Use role-playing, role reversal, modeling, and behavioral rehearsal to help the client develop assertive ways to resolve conflict with parents (recommend Your Perfect Right: Assertiveness and Equality in Your Life and Relationships by Neldon Newport). Objective Family members report a desire for and vision of a new sense of connectedness. Target Date: 2022-10-14 Frequency: Weekly  Progress: 0 Modality: individual  Related Interventions In a family session, assign the family the task of planning and going on an outing or activity; in the following session, process the experience with the family, giving positive reinforcement where appropriate. 3. Enhance ability to effectively cope with the full variety of life's worries and anxieties. 4. Learn and implement coping skills that result in a reduction of anxiety and worry, and improved daily functioning. 5. Reach a level of reduced tension, increased satisfaction, and improved communication with family. 6. Reduce overall frequency, intensity, and duration of the anxiety so that daily functioning is not impaired. Objective Verbalize an understanding of the cognitive, physiological, and behavioral components of anxiety and its treatment. Target Date: 2022-10-14 Frequency: Weekly  Progress: 0 Modality: individual   Related Interventions Discuss how generalized anxiety typically involves excessive worry about unrealistic threats, various bodily expressions of tension, overarousal, and hypervigilance, and avoidance of what is threatening that interact to maintain the problem (see Mastery of  Your Anxiety and Worry: Therapist Guide by Hans Eden; Treating Generalized Anxiety Disorder by Rygh and Amparo Bristol). Discuss how treatment targets worry, anxiety symptoms, and avoidance to help the client manage worry effectively, reduce overarousal, and eliminate unnecessary avoidance. Objective Learn and implement problem-solving strategies for realistically addressing worries. Target Date: 2022-10-14 Frequency: Weekly  Progress: 0 Modality: individual  Related Interventions Teach the client problem-solving strategies involving specifically defining a problem, generating options for addressing it, evaluating the pros and cons of each option, selecting and implementing an optional action, and reevaluating and refining the action (or assign "Applying Problem-Solving to Interpersonal Conflict" in the Adult Psychotherapy Homework Planner by Bryn Gulling). Assign the client a homework exercise in which he/she problem-solves a current problem (see Mastery of Your Anxiety and Worry: Workbook by Adora Fridge and Eliot Ford or Generalized Anxiety Disorder by Eather Colas, and Eliot Ford); review, reinforce success, and provide corrective feedback toward improvement. Objective Describe situations, thoughts, feelings, and actions associated with anxieties and worries, their impact on functioning, and attempts to resolve them. Target Date: 2022-10-14 Frequency: Weekly  Progress: 0 Modality: individual  Related Interventions Ask the client to describe his/her past experiences of anxiety and their impact on functioning; assess the focus, excessiveness, and uncontrollability of the worry and the type, frequency, intensity, and duration of  his/her anxiety symptoms (consider using a structured interview such as The Anxiety Disorders Interview Schedule-Adult Version). Objective Learn and implement calming skills to reduce overall anxiety and manage anxiety symptoms. Target Date: 2022-10-14 Frequency: Weekly  Progress: 0 Modality: individual  Related Interventions Teach the client calming/relaxation skills (e.g., applied relaxation, progressive muscle relaxation, cue controlled relaxation; mindful breathing; biofeedback) and how to discriminate better between relaxation and tension; teach the client how to apply these skills to his/her daily life (e.g., New Directions in Progressive Muscle Relaxation by Casper Harrison, and Hazlett-Stevens; Treating Generalized Anxiety Disorder by Rygh and Amparo Bristol). Assign the client homework each session in which he/she practices relaxation exercises daily, gradually applying them progressively from non-anxiety-provoking to anxiety-provoking situations; review and reinforce success while providing corrective feedback toward improvement. Assign the client to read about progressive muscle relaxation and other calming strategies in relevant books or treatment manuals (e.g., Progressive Relaxation Training by Gwynneth Aliment and Dani Gobble; Mastery of Your Anxiety and Worry: Workbook by Beckie Busing). Objective Learn and implement a strategy to limit the association between various environmental settings and worry, delaying the worry until a designated "worry time." Target Date: 2022-10-14 Frequency: Weekly  Progress: 0 Modality: individual  Related Interventions Explain the rationale for using a worry time as well as how it is to be used; agree upon and implement a worry time with the client. Teach the client how to recognize, stop, and postpone worry to the agreed upon worry time using skills such as thought stopping, relaxation, and redirecting attention (or assign "Making Use of the Thought-Stopping  Technique" and/or "Worry Time" in the Adult Psychotherapy Homework Planner by Jongsma to assist skill development); encourage use in daily life; review and reinforce success while providing corrective feedback toward improvement. Objective Verbalize an understanding of the role that cognitive biases play in excessive irrational worry and persistent anxiety symptoms. Target Date: 2022-10-14 Frequency: Weekly  Progress: 0 Modality: individual  Related Interventions Assist the client in analyzing his/her worries by examining potential biases such as the probability of the negative expectation occurring, the real consequences of it occurring, his/her ability to control the outcome, the worst possible outcome, and his/her ability to accept it (see "Analyze  the Probability of a Feared Event" in the Adult Psychotherapy Homework Planner by Bryn Gulling; Cognitive Therapy of Anxiety Disorders by Alison Stalling). Objective Identify, challenge, and replace biased, fearful self-talk with positive, realistic, and empowering self-talk. Target Date: 2022-10-14 Frequency: Weekly  Progress: 0 Modality: individual  Related Interventions Explore the client's schema and self-talk that mediate his/her fear response; assist him/her in challenging the biases; replace the distorted messages with reality-based alternatives and positive, realistic self-talk that will increase his/her self-confidence in coping with irrational fears (see Cognitive Therapy of Anxiety Disorders by Alison Stalling). Assign the client a homework exercise in which he/she identifies fearful self-talk, identifies biases in the self-talk, generates alternatives, and tests through behavioral experiments (or assign "Negative Thoughts Trigger Negative Feelings" in the Adult Psychotherapy Homework Planner by [facility]); review and reinforce success, providing corrective feedback toward improvement. Objective Learn and implement personal and interpersonal skills to  reduce anxiety and improve interpersonal relationships. Target Date: 2022-10-14 Frequency: Weekly  Progress: 0 Modality: individual  Related Interventions Use instruction, modeling, and role-playing to build the client's general social, communication, and/or conflict resolution skills. Assign the client a homework exercise in which he/she implements communication skills training into his/her daily life (or assign "Restoring Socialization Comfort" in the Adult Psychotherapy Homework Planner by [facility]); review, reinforce success, and provide corrective feedback toward improvement. 7. Resolve the core conflict that is the source of anxiety. 8. Stabilize anxiety level while increasing ability to function on a daily basis.  Diagnosis:Generalized anxiety disorder  Plan:  -next appointment will be Thursday, October 26, 2021 at [facility] in person.

## 2021-10-20 ENCOUNTER — Ambulatory Visit: Payer: Federal, State, Local not specified - PPO | Admitting: Professional

## 2021-10-26 ENCOUNTER — Encounter: Payer: Federal, State, Local not specified - PPO | Admitting: Professional

## 2021-10-26 ENCOUNTER — Ambulatory Visit (INDEPENDENT_AMBULATORY_CARE_PROVIDER_SITE_OTHER): Payer: Federal, State, Local not specified - PPO | Admitting: Professional

## 2021-10-26 ENCOUNTER — Encounter: Payer: Self-pay | Admitting: Professional

## 2021-10-26 DIAGNOSIS — F411 Generalized anxiety disorder: Secondary | ICD-10-CM

## 2021-10-26 NOTE — Progress Notes (Signed)
° ° ° ° ° ° ° ° ° ° ° ° ° ° °  Kristen Nichols, LCMHC °

## 2021-10-26 NOTE — Progress Notes (Signed)
[place] Counselor/Therapist Progress Note  Patient ID: Kristen Nichols, MRN: [id number],    Date: 10/26/2021  Time Spent: 49 minutes 9-949am  Treatment Type: Individual Therapy  Risk Assessment: Danger to Self:  No Self-injurious Behavior: No Danger to Others: No  Subjective: This session was held via video teletherapy. The patient consented to video teletherapy and was located at her home during this session. She is aware it is the responsibility of the patient to secure confidentiality on her end of the session. The provider was in a private home office for the duration of this session.    The patient arrived on time for her webex appointment.  Issues addressed: 1-personal -went out to a concert with husband and son/DIL and had a good time -pt has made an intentional step to make contact during the week with her son -pt's sister reached out to ask if she wants any of her mother's possessions   -pt plans to reach back out and schedule a time to meet -pt knows she want a relationship with her sisters -she thinks re-engaging is going to be a slow process -"I would like for thins to be totally different" -discussed how pt can communicate with her sisters in a healthy way -pt identifies her feeling of anger related to how she managed daughter [name]'s issues 2-professional a-pt admits that she is so tired 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 Achieve a reasonable level of family connectedness and harmony where members support, help, and are concerned for each other. 2. Decrease the level of present conflict with siblings while beginning to let go of or resolving past conflicts with them. Objective Identify own as well as others' role in the family conflicts. Target Date: 2022-10-14 Frequency: Weekly  Progress: 0 Modality:  individual  Related Interventions Confront the client when he/she is not taking responsibility for his/her role in the family conflict and reinforce the client for owning responsibility for his/her contribution to the conflict. Objective Describe the conflicts and the causes of conflicts between self, son, and siblings. Target Date: 2022-10-14 Frequency: Weekly  Progress: 0 Modality: individual  Related Interventions Give verbal permission for the client to have and express own feelings, thoughts, and perspectives in order to foster a sense of autonomy from family. Explore the nature of the client's family conflicts and their perceived causes. Objective Increase the number of positive family interactions by planning activities. Target Date: 2022-10-14 Frequency: Weekly  Progress: 0 Modality: individual  Related Interventions Assist the client in developing a list of positive family activities that promote harmony (e.g., bowling, fishing, playing table games, doing work projects). Schedule such activities into the family calendar. Objective Report an increase in resolving conflicts with siblings by talking calmly and assertively rather than aggressively and defensively. Target Date: 2022-10-14 Frequency: Weekly  Progress: 0 Modality: individual  Related Interventions Use role-playing, role reversal, modeling, and behavioral rehearsal to help the client develop assertive ways to resolve conflict with parents (recommend Your Perfect Right: Assertiveness and Equality in Your Life and Relationships by Neldon Newport). Objective Family members report a desire for and vision of a new sense of connectedness. Target Date: 2022-10-14 Frequency: Weekly  Progress: 0 Modality: individual  Related Interventions In a family session, assign the family the task of planning and going on an outing or activity; in the following session, process the  experience with the family, giving positive reinforcement  where appropriate. 3. Enhance ability to effectively cope with the full variety of life's worries and anxieties. 4. Learn and implement coping skills that result in a reduction of anxiety and worry, and improved daily functioning. 5. Reach a level of reduced tension, increased satisfaction, and improved communication with family. 6. Reduce overall frequency, intensity, and duration of the anxiety so that daily functioning is not impaired. Objective Verbalize an understanding of the cognitive, physiological, and behavioral components of anxiety and its treatment. Target Date: 2022-10-14 Frequency: Weekly  Progress: 0 Modality: individual  Related Interventions Discuss how generalized anxiety typically involves excessive worry about unrealistic threats, various bodily expressions of tension, overarousal, and hypervigilance, and avoidance of what is threatening that interact to maintain the problem (see Mastery of Your Anxiety and Worry: Therapist Guide by Corky Mull, and Barlow; Treating Generalized Anxiety Disorder by Rygh and Amparo Bristol). Discuss how treatment targets worry, anxiety symptoms, and avoidance to help the client manage worry effectively, reduce overarousal, and eliminate unnecessary avoidance. Objective Learn and implement problem-solving strategies for realistically addressing worries. Target Date: 2022-10-14 Frequency: Weekly  Progress: 0 Modality: individual  Related Interventions Teach the client problem-solving strategies involving specifically defining a problem, generating options for addressing it, evaluating the pros and cons of each option, selecting and implementing an optional action, and reevaluating and refining the action (or assign "Applying Problem-Solving to Interpersonal Conflict" in the Adult Psychotherapy Homework Planner by Bryn Gulling). Assign the client a homework exercise in which he/she problem-solves a current problem (see Mastery of Your Anxiety and Worry:  Workbook by Adora Fridge and Eliot Ford or Generalized Anxiety Disorder by Eather Colas, and Eliot Ford); review, reinforce success, and provide corrective feedback toward improvement. Objective Describe situations, thoughts, feelings, and actions associated with anxieties and worries, their impact on functioning, and attempts to resolve them. Target Date: 2022-10-14 Frequency: Weekly  Progress: 0 Modality: individual  Related Interventions Ask the client to describe his/her past experiences of anxiety and their impact on functioning; assess the focus, excessiveness, and uncontrollability of the worry and the type, frequency, intensity, and duration of his/her anxiety symptoms (consider using a structured interview such as The Anxiety Disorders Interview Schedule-Adult Version). Objective Learn and implement calming skills to reduce overall anxiety and manage anxiety symptoms. Target Date: 2022-10-14 Frequency: Weekly  Progress: 0 Modality: individual  Related Interventions Teach the client calming/relaxation skills (e.g., applied relaxation, progressive muscle relaxation, cue controlled relaxation; mindful breathing; biofeedback) and how to discriminate better between relaxation and tension; teach the client how to apply these skills to his/her daily life (e.g., New Directions in Progressive Muscle Relaxation by Casper Harrison, and Hazlett-Stevens; Treating Generalized Anxiety Disorder by Rygh and Amparo Bristol). Assign the client homework each session in which he/she practices relaxation exercises daily, gradually applying them progressively from non-anxiety-provoking to anxiety-provoking situations; review and reinforce success while providing corrective feedback toward improvement. Assign the client to read about progressive muscle relaxation and other calming strategies in relevant books or treatment manuals (e.g., Progressive Relaxation Training by Gwynneth Aliment and Dani Gobble; Mastery of Your Anxiety and Worry:  Workbook by Beckie Busing). Objective Learn and implement a strategy to limit the association between various environmental settings and worry, delaying the worry until a designated "worry time." Target Date: 2022-10-14 Frequency: Weekly  Progress: 0 Modality: individual  Related Interventions Explain the rationale for using a worry time as well as how it is to be used; agree upon and implement a worry time with the client. Teach the  client how to recognize, stop, and postpone worry to the agreed upon worry time using skills such as thought stopping, relaxation, and redirecting attention (or assign "Making Use of the Thought-Stopping Technique" and/or "Worry Time" in the Adult Psychotherapy Homework Planner by Jongsma to assist skill development); encourage use in daily life; review and reinforce success while providing corrective feedback toward improvement. Objective Verbalize an understanding of the role that cognitive biases play in excessive irrational worry and persistent anxiety symptoms. Target Date: 2022-10-14 Frequency: Weekly  Progress: 0 Modality: individual  Related Interventions Assist the client in analyzing his/her worries by examining potential biases such as the probability of the negative expectation occurring, the real consequences of it occurring, his/her ability to control the outcome, the worst possible outcome, and his/her ability to accept it (see "Analyze the Probability of a Feared Event" in the Adult Psychotherapy Homework Planner by Bryn Gulling; Cognitive Therapy of Anxiety Disorders by Alison Stalling). Objective Identify, challenge, and replace biased, fearful self-talk with positive, realistic, and empowering self-talk. Target Date: 2022-10-14 Frequency: Weekly  Progress: 0 Modality: individual  Related Interventions Explore the client's schema and self-talk that mediate his/her fear response; assist him/her in challenging the biases; replace the distorted messages  with reality-based alternatives and positive, realistic self-talk that will increase his/her self-confidence in coping with irrational fears (see Cognitive Therapy of Anxiety Disorders by Alison Stalling). Assign the client a homework exercise in which he/she identifies fearful self-talk, identifies biases in the self-talk, generates alternatives, and tests through behavioral experiments (or assign "Negative Thoughts Trigger Negative Feelings" in the Adult Psychotherapy Homework Planner by [facility]); review and reinforce success, providing corrective feedback toward improvement. Objective Learn and implement personal and interpersonal skills to reduce anxiety and improve interpersonal relationships. Target Date: 2022-10-14 Frequency: Weekly  Progress: 0 Modality: individual  Related Interventions Use instruction, modeling, and role-playing to build the client's general social, communication, and/or conflict resolution skills. Assign the client a homework exercise in which he/she implements communication skills training into his/her daily life (or assign "Restoring Socialization Comfort" in the Adult Psychotherapy Homework Planner by [facility]); review, reinforce success, and provide corrective feedback toward improvement. 7. Resolve the core conflict that is the source of anxiety. 8. Stabilize anxiety level while increasing ability to function on a daily basis.  Diagnosis:Generalized anxiety disorder  Plan:  -next appointment will be Friday, November 10, 2021 at 11am.

## 2021-10-31 ENCOUNTER — Encounter: Payer: Self-pay | Admitting: Professional

## 2021-10-31 ENCOUNTER — Ambulatory Visit (INDEPENDENT_AMBULATORY_CARE_PROVIDER_SITE_OTHER): Payer: Federal, State, Local not specified - PPO | Admitting: Professional

## 2021-10-31 DIAGNOSIS — F411 Generalized anxiety disorder: Secondary | ICD-10-CM

## 2021-10-31 NOTE — Progress Notes (Signed)
[place] Counselor/Therapist Progress Note  Patient ID: Kristen Nichols, MRN: [id number],    Date: 10/31/2021  Time Spent: 54 minutes 1101-1155am  Treatment Type: Individual Therapy  Risk Assessment: Danger to Self:  No Self-injurious Behavior: No Danger to Others: No  Subjective: This session was held via video teletherapy. The patient consented to video teletherapy and was located at her home during this session. She is aware it is the responsibility of the patient to secure confidentiality on her end of the session. The provider was in a private home office for the duration of this session.    The patient arrived on time for her webex appointment.  Issues addressed: 1-personal -she did reach out to her sister and made arrangements for her to go to her house and look through their mother's clothing -pt admits some regret that she ws not there for [facility] and feels some resentment toward Jenny Reichmann   -pt has felt attached verbally 2-reviewed measurable objectives -pt has had measurable progress on the majority of her objectives 3-anxiety -discussed coping skills -educated on progressive muscle relaxation -educated on worry box  Treatment Plan Problems Addressed  Anxiety, Family Conflict Goals 1. Achieve a reasonable level of family connectedness and harmony where members support, help, and are concerned for each other. 2. Decrease the level of present conflict with siblings while beginning to let go of or resolving past conflicts with them. Objective Identify own as well as others' role in the family conflicts. Target Date: 2022-10-14 Frequency: Weekly  Progress: 75 Modality: individual  Related Interventions Confront the client when he/she is not taking responsibility for his/her role in the family conflict and reinforce the client for owning responsibility for his/her contribution to the conflict. Objective Describe the conflicts and the causes of conflicts  between self, son, and siblings. Target Date: 2022-10-14 Frequency: Weekly  Progress: 50 Modality: individual  Related Interventions Give verbal permission for the client to have and express own feelings, thoughts, and perspectives in order to foster a sense of autonomy from family. Explore the nature of the client's family conflicts and their perceived causes. Objective Increase the number of positive family interactions by planning activities. Target Date: 2022-10-14 Frequency: Weekly  Progress: 90 Modality: individual  Related Interventions Assist the client in developing a list of positive family activities that promote harmony (e.g., bowling, fishing, playing table games, doing work projects). Schedule such activities into the family calendar. Objective Report an increase in resolving conflicts with siblings by talking calmly and assertively rather than aggressively and defensively. Target Date: 2022-10-14 Frequency: Weekly  Progress: 0 Modality: individual  Related Interventions Use role-playing, role reversal, modeling, and behavioral rehearsal to help the client develop assertive ways to resolve conflict with parents (recommend Your Perfect Right: Assertiveness and Equality in Your Life and Relationships by Neldon Newport). Objective Family members report a desire for and vision of a new sense of connectedness. Target Date: 2022-10-14 Frequency: Weekly  Progress: 0 Modality: individual  Related Interventions In a family session, assign the family the task of planning and going on an outing or activity; in the following session, process the experience with the family, giving positive reinforcement where appropriate. 3. Enhance ability to effectively cope with the full variety of life's worries and anxieties. 4. Learn and implement coping skills that result in a reduction of anxiety and worry, and improved daily functioning. 5. Reach a level of reduced tension, increased  satisfaction, and improved communication with family. 6. Reduce overall frequency, intensity, and  duration of the anxiety so that daily functioning is not impaired. Objective Verbalize an understanding of the cognitive, physiological, and behavioral components of anxiety and its treatment. Target Date: 2022-10-14 Frequency: Weekly  Progress: 75 Modality: individual  Related Interventions Discuss how generalized anxiety typically involves excessive worry about unrealistic threats, various bodily expressions of tension, overarousal, and hypervigilance, and avoidance of what is threatening that interact to maintain the problem (see Mastery of Your Anxiety and Worry: Therapist Guide by Corky Mull, and Barlow; Treating Generalized Anxiety Disorder by Rygh and Amparo Bristol). Discuss how treatment targets worry, anxiety symptoms, and avoidance to help the client manage worry effectively, reduce overarousal, and eliminate unnecessary avoidance. Objective Learn and implement problem-solving strategies for realistically addressing worries. Target Date: 2022-10-14 Frequency: Weekly  Progress: 0 Modality: individual  Related Interventions Teach the client problem-solving strategies involving specifically defining a problem, generating options for addressing it, evaluating the pros and cons of each option, selecting and implementing an optional action, and reevaluating and refining the action (or assign "Applying Problem-Solving to Interpersonal Conflict" in the Adult Psychotherapy Homework Planner by Bryn Gulling). Assign the client a homework exercise in which he/she problem-solves a current problem (see Mastery of Your Anxiety and Worry: Workbook by Adora Fridge and Eliot Ford or Generalized Anxiety Disorder by Eather Colas, and Eliot Ford); review, reinforce success, and provide corrective feedback toward improvement. Objective Describe situations, thoughts, feelings, and actions associated with anxieties and worries,  their impact on functioning, and attempts to resolve them. Target Date: 2022-10-14 Frequency: Weekly  Progress: 25 Modality: individual  Related Interventions Ask the client to describe his/her past experiences of anxiety and their impact on functioning; assess the focus, excessiveness, and uncontrollability of the worry and the type, frequency, intensity, and duration of his/her anxiety symptoms (consider using a structured interview such as The Anxiety Disorders Interview Schedule-Adult Version). Objective Learn and implement calming skills to reduce overall anxiety and manage anxiety symptoms. Target Date: 2022-10-14 Frequency: Weekly  Progress: 70 Modality: individual  Related Interventions Teach the client calming/relaxation skills (e.g., applied relaxation, progressive muscle relaxation, cue controlled relaxation; mindful breathing; biofeedback) and how to discriminate better between relaxation and tension; teach the client how to apply these skills to his/her daily life (e.g., New Directions in Progressive Muscle Relaxation by Casper Harrison, and Hazlett-Stevens; Treating Generalized Anxiety Disorder by Rygh and Amparo Bristol). Assign the client homework each session in which he/she practices relaxation exercises daily, gradually applying them progressively from non-anxiety-provoking to anxiety-provoking situations; review and reinforce success while providing corrective feedback toward improvement. Assign the client to read about progressive muscle relaxation and other calming strategies in relevant books or treatment manuals (e.g., Progressive Relaxation Training by Gwynneth Aliment and Dani Gobble; Mastery of Your Anxiety and Worry: Workbook by Beckie Busing). Objective Learn and implement a strategy to limit the association between various environmental settings and worry, delaying the worry until a designated "worry time." Target Date: 2022-10-14 Frequency: Weekly  Progress: 0 Modality:  individual  Related Interventions Explain the rationale for using a worry time as well as how it is to be used; agree upon and implement a worry time with the client. Teach the client how to recognize, stop, and postpone worry to the agreed upon worry time using skills such as thought stopping, relaxation, and redirecting attention (or assign "Making Use of the Thought-Stopping Technique" and/or "Worry Time" in the Adult Psychotherapy Homework Planner by Jongsma to assist skill development); encourage use in daily life; review and reinforce success while providing corrective feedback toward improvement. Objective Verbalize  an understanding of the role that cognitive biases play in excessive irrational worry and persistent anxiety symptoms. Target Date: 2022-10-14 Frequency: Weekly  Progress: 80 Modality: individual  Related Interventions Assist the client in analyzing his/her worries by examining potential biases such as the probability of the negative expectation occurring, the real consequences of it occurring, his/her ability to control the outcome, the worst possible outcome, and his/her ability to accept it (see "Analyze the Probability of a Feared Event" in the Adult Psychotherapy Homework Planner by Bryn Gulling; Cognitive Therapy of Anxiety Disorders by Alison Stalling). Objective Identify, challenge, and replace biased, fearful self-talk with positive, realistic, and empowering self-talk. Target Date: 2022-10-14 Frequency: Weekly  Progress: 25 Modality: individual  Related Interventions Explore the client's schema and self-talk that mediate his/her fear response; assist him/her in challenging the biases; replace the distorted messages with reality-based alternatives and positive, realistic self-talk that will increase his/her self-confidence in coping with irrational fears (see Cognitive Therapy of Anxiety Disorders by Alison Stalling). Assign the client a homework exercise in which he/she  identifies fearful self-talk, identifies biases in the self-talk, generates alternatives, and tests through behavioral experiments (or assign "Negative Thoughts Trigger Negative Feelings" in the Adult Psychotherapy Homework Planner by [facility]); review and reinforce success, providing corrective feedback toward improvement. Objective Learn and implement personal and interpersonal skills to reduce anxiety and improve interpersonal relationships. Target Date: 2022-10-14 Frequency: Weekly  Progress: 50 Modality: individual  Related Interventions Use instruction, modeling, and role-playing to build the client's general social, communication, and/or conflict resolution skills. Assign the client a homework exercise in which he/she implements communication skills training into his/her daily life (or assign "Restoring Socialization Comfort" in the Adult Psychotherapy Homework Planner by [facility]); review, reinforce success, and provide corrective feedback toward improvement. 7. Resolve the core conflict that is the source of anxiety. 8. Stabilize anxiety level while increasing ability to function on a daily basis.  Diagnosis:Generalized anxiety disorder  Plan:  -pt will have positive visit with her sister -pt will begin to use a worry box -begin learning about progressive muscle relaxation -next appointment will be Friday, November 10, 2021 at 11am.

## 2021-11-03 ENCOUNTER — Ambulatory Visit: Payer: Federal, State, Local not specified - PPO | Admitting: Podiatrist

## 2021-11-03 DIAGNOSIS — G5761 Lesion of plantar nerve, right lower limb: Secondary | ICD-10-CM | POA: Diagnosis not present

## 2021-11-03 MED ORDER — TRIAMCINOLONE ACETONIDE 10 MG/ML IJ SUSP
10.0000 mg | Freq: Once | INTRAMUSCULAR | Status: AC
  Administered 2021-11-03: 10 mg

## 2021-11-03 NOTE — Progress Notes (Unsigned)
  Chief Complaint  Patient presents with   Neuroma    Right foot neuroma f/u patient states the pain is the same as the previous visit. Patient states she is not sure if she needs an injection because the pain is not constant it varies.     HPI: Patient is 66 y.o. female who presents today for follow up of neuroma right foot.  She relates she has pain at times but it is not constant.  She has pain when wearing certain shoes and if she bends her foot a certain way.  She tried meloxicam and padding with no improvement in symptoms.     No Known Allergies  Review of systems is negative except as noted in the HPI.  Denies nausea/ vomiting/ fevers/ chills or night sweats.   Denies difficulty breathing, denies calf pain or tenderness  Physical Exam  Patient is awake, alert, and oriented x 3.  In no acute distress.    Vascular status is intact with palpable pedal pulses DP and PT bilateral and capillary refill time less than 3 seconds bilateral.  No edema or erythema noted.   Neurological exam reveals epicritic and protective sensation grossly intact bilateral. Neuroma symptomatology present third interspace right foot. Palpable click noted.   Dermatological exam reveals skin is supple and dry to bilateral feet.  No open lesions present.    Musculoskeletal exam: Musculature intact with dorsiflexion, plantarflexion, inversion, eversion. Ankle and First MPJ joint range of motion normal.    Assessment:   ICD-10-CM   1. Morton's neuroma of right foot  G57.61 triamcinolone acetonide (KENALOG) 10 MG/ML injection 10 mg       Plan: Discussed she has tried conservative treatments and I recommended an injection as a diagnostic and therapeutic tool. She agreed and an injection of Kenalog 10 and Marcaine plain was infiltrated into the right foot without complication. She tolerated this well.  I discussed that multiple injections are sometimes required for this and I expect she may need more in order to  resolve the pain.  She will return in 4 weeks for likely re-injection. And will call sooner if any questions arise.

## 2021-11-10 ENCOUNTER — Ambulatory Visit (INDEPENDENT_AMBULATORY_CARE_PROVIDER_SITE_OTHER): Payer: Federal, State, Local not specified - PPO | Admitting: Professional

## 2021-11-10 ENCOUNTER — Encounter: Payer: Self-pay | Admitting: Professional

## 2021-11-10 DIAGNOSIS — F411 Generalized anxiety disorder: Secondary | ICD-10-CM | POA: Diagnosis not present

## 2021-11-10 NOTE — Progress Notes (Signed)
[facility] Counselor/Therapist Progress Note  Patient ID: MYRL BYNUM, MRN: [id number],    Date: 11/10/2021  Time Spent: 44 minutes 1101-1145am  Treatment Type: Individual Therapy  Risk Assessment: Danger to Self:  No Self-injurious Behavior: No Danger to Others: No  Subjective: This session was held via video teletherapy. The patient consented to video teletherapy and was located at her home during this session. She is aware it is the responsibility of the patient to secure confidentiality on her end of the session. The provider was in a private home office for the duration of this session.    The patient arrived on time for her webex appointment.  Issues addressed: 1-homework -pt will have positive visit with her sister-  "it went really well" -pt will begin to use a worry box- ongoing -begin learning about progressive muscle relaxation- downloaded, but has not started 2-personal a-talked to her sister Jenny Reichmann about a commitment to each other and she will reach out if the pt "disappears or is in her shell even if it makes me mad" -pt learned that Jenny Reichmann and her friend Amy is afraid of oldest sister Cecille Rubin too -on Sunday Aida Puffer was over to help bringing in her outdoor plants and succulents b-son [place] texted and wanted to know if she would watch Vassie Loll, her grandson   -son opted to not go to [employer] race because he had been traveling and really didn't want to travel again   -Wilfred Lacy thanked her for being honest and transparent   -this was the first time he has asked her to babysit at her home c-sister Cecille Rubin -six years younger -not easy to talk to -pt worries about how to approach -discussed strategies that pt would feel comfortable with   -pt plans to send card   -pt plans to send [facility]   -pt plans to call and discuss getting together     -wants to invite her to session  Treatment Plan Problems Addressed  Anxiety, Family Conflict Goals 1. Achieve a  reasonable level of family connectedness and harmony where members support, help, and are concerned for each other. 2. Decrease the level of present conflict with siblings while beginning to let go of or resolving past conflicts with them. Objective Identify own as well as others' role in the family conflicts. Target Date: 2022-10-14 Frequency: Weekly  Progress: 75 Modality: individual  Related Interventions Confront the client when he/she is not taking responsibility for his/her role in the family conflict and reinforce the client for owning responsibility for his/her contribution to the conflict. Objective Describe the conflicts and the causes of conflicts between self, son, and siblings. Target Date: 2022-10-14 Frequency: Weekly  Progress: 50 Modality: individual  Related Interventions Give verbal permission for the client to have and express own feelings, thoughts, and perspectives in order to foster a sense of autonomy from family. Explore the nature of the client's family conflicts and their perceived causes. Objective Increase the number of positive family interactions by planning activities. Target Date: 2022-10-14 Frequency: Weekly  Progress: 90 Modality: individual  Related Interventions Assist the client in developing a list of positive family activities that promote harmony (e.g., bowling, fishing, playing table games, doing work projects). Schedule such activities into the family calendar. Objective Report an increase in resolving conflicts with siblings by talking calmly and assertively rather than aggressively and defensively. Target Date: 2022-10-14 Frequency: Weekly  Progress: 0 Modality: individual  Related Interventions Use role-playing, role reversal, modeling, and behavioral rehearsal to help the  client develop assertive ways to resolve conflict with parents (recommend Your Perfect Right: Assertiveness and Equality in Your Life and Relationships by Neldon Newport). Objective Family members report a desire for and vision of a new sense of connectedness. Target Date: 2022-10-14 Frequency: Weekly  Progress: 0 Modality: individual  Related Interventions In a family session, assign the family the task of planning and going on an outing or activity; in the following session, process the experience with the family, giving positive reinforcement where appropriate. 3. Enhance ability to effectively cope with the full variety of life's worries and anxieties. 4. Learn and implement coping skills that result in a reduction of anxiety and worry, and improved daily functioning. 5. Reach a level of reduced tension, increased satisfaction, and improved communication with family. 6. Reduce overall frequency, intensity, and duration of the anxiety so that daily functioning is not impaired. Objective Verbalize an understanding of the cognitive, physiological, and behavioral components of anxiety and its treatment. Target Date: 2022-10-14 Frequency: Weekly  Progress: 75 Modality: individual  Related Interventions Discuss how generalized anxiety typically involves excessive worry about unrealistic threats, various bodily expressions of tension, overarousal, and hypervigilance, and avoidance of what is threatening that interact to maintain the problem (see Mastery of Your Anxiety and Worry: Therapist Guide by Corky Mull, and Barlow; Treating Generalized Anxiety Disorder by Rygh and Amparo Bristol). Discuss how treatment targets worry, anxiety symptoms, and avoidance to help the client manage worry effectively, reduce overarousal, and eliminate unnecessary avoidance. Objective Learn and implement problem-solving strategies for realistically addressing worries. Target Date: 2022-10-14 Frequency: Weekly  Progress: 0 Modality: individual  Related Interventions Teach the client problem-solving strategies involving specifically defining a problem, generating options for  addressing it, evaluating the pros and cons of each option, selecting and implementing an optional action, and reevaluating and refining the action (or assign "Applying Problem-Solving to Interpersonal Conflict" in the Adult Psychotherapy Homework Planner by Bryn Gulling). Assign the client a homework exercise in which he/she problem-solves a current problem (see Mastery of Your Anxiety and Worry: Workbook by Adora Fridge and Eliot Ford or Generalized Anxiety Disorder by Eather Colas, and Eliot Ford); review, reinforce success, and provide corrective feedback toward improvement. Objective Describe situations, thoughts, feelings, and actions associated with anxieties and worries, their impact on functioning, and attempts to resolve them. Target Date: 2022-10-14 Frequency: Weekly  Progress: 25 Modality: individual  Related Interventions Ask the client to describe his/her past experiences of anxiety and their impact on functioning; assess the focus, excessiveness, and uncontrollability of the worry and the type, frequency, intensity, and duration of his/her anxiety symptoms (consider using a structured interview such as The Anxiety Disorders Interview Schedule-Adult Version). Objective Learn and implement calming skills to reduce overall anxiety and manage anxiety symptoms. Target Date: 2022-10-14 Frequency: Weekly  Progress: 70 Modality: individual  Related Interventions Teach the client calming/relaxation skills (e.g., applied relaxation, progressive muscle relaxation, cue controlled relaxation; mindful breathing; biofeedback) and how to discriminate better between relaxation and tension; teach the client how to apply these skills to his/her daily life (e.g., New Directions in Progressive Muscle Relaxation by Casper Harrison, and Hazlett-Stevens; Treating Generalized Anxiety Disorder by Rygh and Amparo Bristol). Assign the client homework each session in which he/she practices relaxation exercises daily, gradually  applying them progressively from non-anxiety-provoking to anxiety-provoking situations; review and reinforce success while providing corrective feedback toward improvement. Assign the client to read about progressive muscle relaxation and other calming strategies in relevant books or treatment manuals (e.g., Progressive Relaxation Training by Gwynneth Aliment and Dani Gobble;  Mastery of Your Anxiety and Worry: Workbook by Beckie Busing). Objective Learn and implement a strategy to limit the association between various environmental settings and worry, delaying the worry until a designated "worry time." Target Date: 2022-10-14 Frequency: Weekly  Progress: 0 Modality: individual  Related Interventions Explain the rationale for using a worry time as well as how it is to be used; agree upon and implement a worry time with the client. Teach the client how to recognize, stop, and postpone worry to the agreed upon worry time using skills such as thought stopping, relaxation, and redirecting attention (or assign "Making Use of the Thought-Stopping Technique" and/or "Worry Time" in the Adult Psychotherapy Homework Planner by Jongsma to assist skill development); encourage use in daily life; review and reinforce success while providing corrective feedback toward improvement. Objective Verbalize an understanding of the role that cognitive biases play in excessive irrational worry and persistent anxiety symptoms. Target Date: 2022-10-14 Frequency: Weekly  Progress: 80 Modality: individual  Related Interventions Assist the client in analyzing his/her worries by examining potential biases such as the probability of the negative expectation occurring, the real consequences of it occurring, his/her ability to control the outcome, the worst possible outcome, and his/her ability to accept it (see "Analyze the Probability of a Feared Event" in the Adult Psychotherapy Homework Planner by Bryn Gulling; Cognitive Therapy of Anxiety  Disorders by Alison Stalling). Objective Identify, challenge, and replace biased, fearful self-talk with positive, realistic, and empowering self-talk. Target Date: 2022-10-14 Frequency: Weekly  Progress: 25 Modality: individual  Related Interventions Explore the client's schema and self-talk that mediate his/her fear response; assist him/her in challenging the biases; replace the distorted messages with reality-based alternatives and positive, realistic self-talk that will increase his/her self-confidence in coping with irrational fears (see Cognitive Therapy of Anxiety Disorders by Alison Stalling). Assign the client a homework exercise in which he/she identifies fearful self-talk, identifies biases in the self-talk, generates alternatives, and tests through behavioral experiments (or assign "Negative Thoughts Trigger Negative Feelings" in the Adult Psychotherapy Homework Planner by [facility]); review and reinforce success, providing corrective feedback toward improvement. Objective Learn and implement personal and interpersonal skills to reduce anxiety and improve interpersonal relationships. Target Date: 2022-10-14 Frequency: Weekly  Progress: 50 Modality: individual  Related Interventions Use instruction, modeling, and role-playing to build the client's general social, communication, and/or conflict resolution skills. Assign the client a homework exercise in which he/she implements communication skills training into his/her daily life (or assign "Restoring Socialization Comfort" in the Adult Psychotherapy Homework Planner by [facility]); review, reinforce success, and provide corrective feedback toward improvement. 7. Resolve the core conflict that is the source of anxiety. 8. Stabilize anxiety level while increasing ability to function on a daily basis.  Diagnosis:Generalized anxiety disorder  Plan:   -next appointment will be Friday, November 17, 2021 at [facility].

## 2021-11-17 ENCOUNTER — Ambulatory Visit: Payer: Federal, State, Local not specified - PPO | Admitting: Professional

## 2021-11-24 ENCOUNTER — Ambulatory Visit: Payer: Federal, State, Local not specified - PPO | Admitting: Professional

## 2021-11-28 ENCOUNTER — Encounter: Payer: Self-pay | Admitting: Professional

## 2021-11-28 ENCOUNTER — Ambulatory Visit (INDEPENDENT_AMBULATORY_CARE_PROVIDER_SITE_OTHER): Payer: Federal, State, Local not specified - PPO | Admitting: Professional

## 2021-11-28 DIAGNOSIS — F411 Generalized anxiety disorder: Secondary | ICD-10-CM | POA: Diagnosis not present

## 2021-11-28 NOTE — Progress Notes (Signed)
[facility] Counselor/Therapist Progress Note  Patient ID: Kristen Nichols, MRN: [id number],    Date: 11/28/2021  Time Spent: 41 minutes 8-841am  Treatment Type: Individual Therapy  Risk Assessment: Danger to Self:  No Self-injurious Behavior: No Danger to Others: No  Subjective: This session was held via video teletherapy. The patient consented to video teletherapy and was located at her home during this session. She is aware it is the responsibility of the patient to secure confidentiality on her end of the session. The provider was in a private home office for the duration of this session.    The patient arrived on time for her webex appointment.  Issues addressed: 1-grief a-loss of  49 year old oldest and closet friend of 50 years, and cousin Richardson Landry -pt reports she misses him a great deal -she had the most authentic relationship and she was more open with him than anyone b-"he was my horse person" -Richardson Landry had to put his last horse down one week before his death   -he had put his dog down earlier this year c-Celebration of Life will be on December 9th when she is in [place] -discussed opportunities to attend via online video or audio -pt going to discuss with friends to make her "attendance" an option 2-Christmas holiday with family a-son Wilfred Lacy does not yet know about his sister Aida Puffer coming to Christmas -plans to address her father's thankfulness for pt and her family coming -plans to ask him to come even though his sister will be present b-sister Cecille Rubin -Cecille Rubin invited her and her spouse to Thanksgivings   -pt declined due to already having a commitment -has not yet completed   -pt plans to send card today   -pt plans to order [place] makes no room for negativity  Treatment Plan Problems Addressed  Anxiety, Family Conflict Goals 1. Achieve a reasonable level of family connectedness and harmony where members support,  help, and are concerned for each other. 2. Decrease the level of present conflict with siblings while beginning to let go of or resolving past conflicts with them. Objective Identify own as well as others' role in the family conflicts. Target Date: 2022-10-14 Frequency: Weekly  Progress: 75 Modality: individual  Related Interventions Confront the client when he/she is not taking responsibility for his/her role in the family conflict and reinforce the client for owning responsibility for his/her contribution to the conflict. Objective Describe the conflicts and the causes of conflicts between self, son, and siblings. Target Date: 2022-10-14 Frequency: Weekly  Progress: 50 Modality: individual  Related Interventions Give verbal permission for the client to have and express own feelings, thoughts, and perspectives in order to foster a sense of autonomy from family. Explore the nature of the client's family conflicts and their perceived causes. Objective Increase the number of positive family interactions by planning activities. Target Date: 2022-10-14 Frequency: Weekly  Progress: 90 Modality: individual  Related Interventions Assist the client in developing a list of positive family activities that promote harmony (e.g., bowling, fishing, playing table games, doing work projects). Schedule such activities into the family calendar. Objective Report an increase in resolving conflicts with siblings by talking calmly and assertively rather than aggressively and defensively. Target Date: 2022-10-14 Frequency: Weekly  Progress: 0 Modality: individual  Related Interventions Use role-playing, role reversal, modeling, and behavioral rehearsal to help the client develop assertive ways to resolve conflict with parents (recommend Your Perfect Right: Assertiveness and Equality in Your Life and Relationships by Ruthann Cancer  and Achilles Dunk). Objective Family members report a desire for and vision of a new sense of  connectedness. Target Date: 2022-10-14 Frequency: Weekly  Progress: 0 Modality: individual  Related Interventions In a family session, assign the family the task of planning and going on an outing or activity; in the following session, process the experience with the family, giving positive reinforcement where appropriate. 3. Enhance ability to effectively cope with the full variety of life's worries and anxieties. 4. Learn and implement coping skills that result in a reduction of anxiety and worry, and improved daily functioning. 5. Reach a level of reduced tension, increased satisfaction, and improved communication with family. 6. Reduce overall frequency, intensity, and duration of the anxiety so that daily functioning is not impaired. Objective Verbalize an understanding of the cognitive, physiological, and behavioral components of anxiety and its treatment. Target Date: 2022-10-14 Frequency: Weekly  Progress: 75 Modality: individual  Related Interventions Discuss how generalized anxiety typically involves excessive worry about unrealistic threats, various bodily expressions of tension, overarousal, and hypervigilance, and avoidance of what is threatening that interact to maintain the problem (see Mastery of Your Anxiety and Worry: Therapist Guide by Corky Mull, and Barlow; Treating Generalized Anxiety Disorder by Rygh and Amparo Bristol). Discuss how treatment targets worry, anxiety symptoms, and avoidance to help the client manage worry effectively, reduce overarousal, and eliminate unnecessary avoidance. Objective Learn and implement problem-solving strategies for realistically addressing worries. Target Date: 2022-10-14 Frequency: Weekly  Progress: 0 Modality: individual  Related Interventions Teach the client problem-solving strategies involving specifically defining a problem, generating options for addressing it, evaluating the pros and cons of each option, selecting and implementing  an optional action, and reevaluating and refining the action (or assign "Applying Problem-Solving to Interpersonal Conflict" in the Adult Psychotherapy Homework Planner by Bryn Gulling). Assign the client a homework exercise in which he/she problem-solves a current problem (see Mastery of Your Anxiety and Worry: Workbook by Adora Fridge and Eliot Ford or Generalized Anxiety Disorder by Eather Colas, and Eliot Ford); review, reinforce success, and provide corrective feedback toward improvement. Objective Describe situations, thoughts, feelings, and actions associated with anxieties and worries, their impact on functioning, and attempts to resolve them. Target Date: 2022-10-14 Frequency: Weekly  Progress: 25 Modality: individual  Related Interventions Ask the client to describe his/her past experiences of anxiety and their impact on functioning; assess the focus, excessiveness, and uncontrollability of the worry and the type, frequency, intensity, and duration of his/her anxiety symptoms (consider using a structured interview such as The Anxiety Disorders Interview Schedule-Adult Version). Objective Learn and implement calming skills to reduce overall anxiety and manage anxiety symptoms. Target Date: 2022-10-14 Frequency: Weekly  Progress: 70 Modality: individual  Related Interventions Teach the client calming/relaxation skills (e.g., applied relaxation, progressive muscle relaxation, cue controlled relaxation; mindful breathing; biofeedback) and how to discriminate better between relaxation and tension; teach the client how to apply these skills to his/her daily life (e.g., New Directions in Progressive Muscle Relaxation by Casper Harrison, and Hazlett-Stevens; Treating Generalized Anxiety Disorder by Rygh and Amparo Bristol). Assign the client homework each session in which he/she practices relaxation exercises daily, gradually applying them progressively from non-anxiety-provoking to anxiety-provoking situations;  review and reinforce success while providing corrective feedback toward improvement. Assign the client to read about progressive muscle relaxation and other calming strategies in relevant books or treatment manuals (e.g., Progressive Relaxation Training by Gwynneth Aliment and Dani Gobble; Mastery of Your Anxiety and Worry: Workbook by Beckie Busing). Objective Learn and implement a strategy to limit the association between various  environmental settings and worry, delaying the worry until a designated "worry time." Target Date: 2022-10-14 Frequency: Weekly  Progress: 0 Modality: individual  Related Interventions Explain the rationale for using a worry time as well as how it is to be used; agree upon and implement a worry time with the client. Teach the client how to recognize, stop, and postpone worry to the agreed upon worry time using skills such as thought stopping, relaxation, and redirecting attention (or assign "Making Use of the Thought-Stopping Technique" and/or "Worry Time" in the Adult Psychotherapy Homework Planner by Jongsma to assist skill development); encourage use in daily life; review and reinforce success while providing corrective feedback toward improvement. Objective Verbalize an understanding of the role that cognitive biases play in excessive irrational worry and persistent anxiety symptoms. Target Date: 2022-10-14 Frequency: Weekly  Progress: 80 Modality: individual  Related Interventions Assist the client in analyzing his/her worries by examining potential biases such as the probability of the negative expectation occurring, the real consequences of it occurring, his/her ability to control the outcome, the worst possible outcome, and his/her ability to accept it (see "Analyze the Probability of a Feared Event" in the Adult Psychotherapy Homework Planner by Bryn Gulling; Cognitive Therapy of Anxiety Disorders by Alison Stalling). Objective Identify, challenge, and replace biased, fearful  self-talk with positive, realistic, and empowering self-talk. Target Date: 2022-10-14 Frequency: Weekly  Progress: 25 Modality: individual  Related Interventions Explore the client's schema and self-talk that mediate his/her fear response; assist him/her in challenging the biases; replace the distorted messages with reality-based alternatives and positive, realistic self-talk that will increase his/her self-confidence in coping with irrational fears (see Cognitive Therapy of Anxiety Disorders by Alison Stalling). Assign the client a homework exercise in which he/she identifies fearful self-talk, identifies biases in the self-talk, generates alternatives, and tests through behavioral experiments (or assign "Negative Thoughts Trigger Negative Feelings" in the Adult Psychotherapy Homework Planner by [facility]); review and reinforce success, providing corrective feedback toward improvement. Objective Learn and implement personal and interpersonal skills to reduce anxiety and improve interpersonal relationships. Target Date: 2022-10-14 Frequency: Weekly  Progress: 50 Modality: individual  Related Interventions Use instruction, modeling, and role-playing to build the client's general social, communication, and/or conflict resolution skills. Assign the client a homework exercise in which he/she implements communication skills training into his/her daily life (or assign "Restoring Socialization Comfort" in the Adult Psychotherapy Homework Planner by [facility]); review, reinforce success, and provide corrective feedback toward improvement. 7. Resolve the core conflict that is the source of anxiety. 8. Stabilize anxiety level while increasing ability to function on a daily basis.  Diagnosis:Generalized anxiety disorder  Plan:  -pt to send card and peace lily to sister Cecille Rubin -next appointment will be Friday, December 08, 2021 at [facility].

## 2021-12-07 ENCOUNTER — Encounter: Payer: Self-pay | Admitting: Podiatrist

## 2021-12-07 ENCOUNTER — Ambulatory Visit: Payer: Federal, State, Local not specified - PPO | Admitting: Podiatrist

## 2021-12-07 DIAGNOSIS — G5761 Lesion of plantar nerve, right lower limb: Secondary | ICD-10-CM | POA: Diagnosis not present

## 2021-12-07 MED ORDER — TRIAMCINOLONE ACETONIDE 10 MG/ML IJ SUSP
10.0000 mg | Freq: Once | INTRAMUSCULAR | Status: AC
  Administered 2021-12-07: 10 mg

## 2021-12-07 NOTE — Progress Notes (Signed)
  No chief complaint on file.    HPI: Patient is 66 y.o. female who presents today for follow up of neuroma right foot.  She relates the pain has improved to about 80% since the last visit where she received her first injection- third interspace of the right foot.  She relates the pain is starting the creep back in and she would like to request another injection if indicated.    No Known Allergies  Review of systems is negative except as noted in the HPI.  Denies nausea/ vomiting/ fevers/ chills or night sweats.   Denies difficulty breathing, denies calf pain or tenderness  Physical Exam  Patient is awake, alert, and oriented x 3.  In no acute distress.    Vascular status is intact with palpable pedal pulses DP and PT bilateral and capillary refill time less than 3 seconds bilateral.  No edema or erythema noted. Mild purplish discoloration of toes noted, likely due to the cold temperature in the room today.   Neurological exam reveals epicritic and protective sensation grossly intact bilateral. Neuroma symptomatology present third interspace right foot. Palpable pain at the level of the metatarsal heads noted.    Dermatological exam reveals skin is supple and dry to bilateral feet.  No open lesions present.    Musculoskeletal exam: Musculature intact with dorsiflexion, plantarflexion, inversion, eversion. Ankle and First MPJ joint range of motion normal.    Assessment:   ICD-10-CM   1. Morton's neuroma of right foot  G57.61          Plan: Discussed that a second injection should do well for her in resolving her neuroma pain/ symptoms.  She agreed and an injection of Kenalog 10 and Marcaine plain was infiltrated into the right foot- third interspace without complication. She tolerated this well.  She will call if this fails to resolve her neuroma symptoms and will be seen prn.

## 2021-12-08 ENCOUNTER — Ambulatory Visit: Payer: Federal, State, Local not specified - PPO | Admitting: Family Medicine

## 2021-12-08 ENCOUNTER — Ambulatory Visit (INDEPENDENT_AMBULATORY_CARE_PROVIDER_SITE_OTHER): Payer: Federal, State, Local not specified - PPO | Admitting: Professional

## 2021-12-08 ENCOUNTER — Other Ambulatory Visit: Payer: Self-pay | Admitting: Family Medicine

## 2021-12-08 ENCOUNTER — Encounter: Payer: Self-pay | Admitting: Professional

## 2021-12-08 DIAGNOSIS — F411 Generalized anxiety disorder: Secondary | ICD-10-CM | POA: Diagnosis not present

## 2021-12-08 DIAGNOSIS — I10 Essential (primary) hypertension: Secondary | ICD-10-CM

## 2021-12-08 NOTE — Progress Notes (Signed)
[facility] Counselor/Therapist Progress Note  Patient ID: Kristen Nichols, MRN: [id number],    Date: 12/08/2021  Time Spent: 42 minutes 902-944am  Treatment Type: Individual Therapy  Risk Assessment: Danger to Self:  No Self-injurious Behavior: No Danger to Others: No  Subjective: This session was held via video teletherapy. The patient consented to video teletherapy and was located at her home during this session. She is aware it is the responsibility of the patient to secure confidentiality on her end of the session. The provider was in a private home office for the duration of this session.    The patient arrived on time for her webex appointment.  Issues addressed: 1-homework -pt to send card and peace lily to sister Kristen Nichols a-Kristen Nichols's husband is at [facility]en Nichols and was diagnosed with an inoperable brain tumor -pt has a good relationship with her Kristen Nichols -pt is disappointed with herself that she did not reach out as planned after last session b-son Kristen Nichols's family -had Thanksgiving with his family on Friday and they had a great time -he shared that he will attend Christmas at granddad's home -he mentioned that they need to come together c-self-awareness -"I'm heart broken for Kristen Nichols, for Kristen Nichols, for myself as well. I have that guilt right now and that what I have to work on." -pt admits that she wants to be present in the moment for the people she loves -pt reports people in her "work world" talk about how compassionate she is    -she does not do it with the people she wants to be with d-self-care -leaving next week for vacation   -will come back if needed  2023 Treatment Plan Problems Addressed  Anxiety, Family Conflict Goals 1. Achieve a reasonable level of family connectedness and harmony where members support, help, and are concerned for each other. 2. Decrease the level of present conflict with siblings while beginning to let go of or resolving past  conflicts with them. Objective Identify own as well as others' role in the family conflicts. Target Date: 2022-10-14 Frequency: Weekly  Progress: 75 Modality: individual  Related Interventions Confront the client when he/she is not taking responsibility for his/her role in the family conflict and reinforce the client for owning responsibility for his/her contribution to the conflict. Objective Describe the conflicts and the causes of conflicts between self, son, and siblings. Target Date: 2022-10-14 Frequency: Weekly  Progress: 50 Modality: individual  Related Interventions Give verbal permission for the client to have and express own feelings, thoughts, and perspectives in order to foster a sense of autonomy from family. Explore the nature of the client's family conflicts and their perceived causes. Objective Increase the number of positive family interactions by planning activities. Target Date: 2022-10-14 Frequency: Weekly  Progress: 90 Modality: individual  Related Interventions Assist the client in developing a list of positive family activities that promote harmony (e.g., bowling, fishing, playing table games, doing work projects). Schedule such activities into the family calendar. Objective Report an increase in resolving conflicts with siblings by talking calmly and assertively rather than aggressively and defensively. Target Date: 2022-10-14 Frequency: Weekly  Progress: 0 Modality: individual  Related Interventions Use role-playing, role reversal, modeling, and behavioral rehearsal to help the client develop assertive ways to resolve conflict with parents (recommend Your Perfect Right: Assertiveness and Equality in Your Life and Relationships by Neldon Newport). Objective Family members report a desire for and vision of a new sense of connectedness. Target Date: 2022-10-14 Frequency: Weekly  Progress:  0 Modality: individual  Related Interventions In a family session,  assign the family the task of planning and going on an outing or activity; in the following session, process the experience with the family, giving positive reinforcement where appropriate. 3. Enhance ability to effectively cope with the full variety of life's worries and anxieties. 4. Learn and implement coping skills that result in a reduction of anxiety and worry, and improved daily functioning. 5. Reach a level of reduced tension, increased satisfaction, and improved communication with family. 6. Reduce overall frequency, intensity, and duration of the anxiety so that daily functioning is not impaired. Objective Verbalize an understanding of the cognitive, physiological, and behavioral components of anxiety and its treatment. Target Date: 2022-10-14 Frequency: Weekly  Progress: 75 Modality: individual  Related Interventions Discuss how generalized anxiety typically involves excessive worry about unrealistic threats, various bodily expressions of tension, overarousal, and hypervigilance, and avoidance of what is threatening that interact to maintain the problem (see Mastery of Your Anxiety and Worry: Therapist Guide by Corky Mull, and Barlow; Treating Generalized Anxiety Disorder by Rygh and Amparo Bristol). Discuss how treatment targets worry, anxiety symptoms, and avoidance to help the client manage worry effectively, reduce overarousal, and eliminate unnecessary avoidance. Objective Learn and implement problem-solving strategies for realistically addressing worries. Target Date: 2022-10-14 Frequency: Weekly  Progress: 0 Modality: individual  Related Interventions Teach the client problem-solving strategies involving specifically defining a problem, generating options for addressing it, evaluating the pros and cons of each option, selecting and implementing an optional action, and reevaluating and refining the action (or assign "Applying Problem-Solving to Interpersonal Conflict" in the Adult  Psychotherapy Homework Planner by Bryn Gulling). Assign the client a homework exercise in which he/she problem-solves a current problem (see Mastery of Your Anxiety and Worry: Workbook by Adora Fridge and Eliot Ford or Generalized Anxiety Disorder by Eather Colas, and Eliot Ford); review, reinforce success, and provide corrective feedback toward improvement. Objective Describe situations, thoughts, feelings, and actions associated with anxieties and worries, their impact on functioning, and attempts to resolve them. Target Date: 2022-10-14 Frequency: Weekly  Progress: 25 Modality: individual  Related Interventions Ask the client to describe his/her past experiences of anxiety and their impact on functioning; assess the focus, excessiveness, and uncontrollability of the worry and the type, frequency, intensity, and duration of his/her anxiety symptoms (consider using a structured interview such as The Anxiety Disorders Interview Schedule-Adult Version). Objective Learn and implement calming skills to reduce overall anxiety and manage anxiety symptoms. Target Date: 2022-10-14 Frequency: Weekly  Progress: 70 Modality: individual  Related Interventions Teach the client calming/relaxation skills (e.g., applied relaxation, progressive muscle relaxation, cue controlled relaxation; mindful breathing; biofeedback) and how to discriminate better between relaxation and tension; teach the client how to apply these skills to his/her daily life (e.g., New Directions in Progressive Muscle Relaxation by Casper Harrison, and Hazlett-Stevens; Treating Generalized Anxiety Disorder by Rygh and Amparo Bristol). Assign the client homework each session in which he/she practices relaxation exercises daily, gradually applying them progressively from non-anxiety-provoking to anxiety-provoking situations; review and reinforce success while providing corrective feedback toward improvement. Assign the client to read about progressive muscle  relaxation and other calming strategies in relevant books or treatment manuals (e.g., Progressive Relaxation Training by Gwynneth Aliment and Dani Gobble; Mastery of Your Anxiety and Worry: Workbook by Beckie Busing). Objective Learn and implement a strategy to limit the association between various environmental settings and worry, delaying the worry until a designated "worry time." Target Date: 2022-10-14 Frequency: Weekly  Progress: 0 Modality: individual  Related  Interventions Explain the rationale for using a worry time as well as how it is to be used; agree upon and implement a worry time with the client. Teach the client how to recognize, stop, and postpone worry to the agreed upon worry time using skills such as thought stopping, relaxation, and redirecting attention (or assign "Making Use of the Thought-Stopping Technique" and/or "Worry Time" in the Adult Psychotherapy Homework Planner by Jongsma to assist skill development); encourage use in daily life; review and reinforce success while providing corrective feedback toward improvement. Objective Verbalize an understanding of the role that cognitive biases play in excessive irrational worry and persistent anxiety symptoms. Target Date: 2022-10-14 Frequency: Weekly  Progress: 80 Modality: individual  Related Interventions Assist the client in analyzing his/her worries by examining potential biases such as the probability of the negative expectation occurring, the real consequences of it occurring, his/her ability to control the outcome, the worst possible outcome, and his/her ability to accept it (see "Analyze the Probability of a Feared Event" in the Adult Psychotherapy Homework Planner by Bryn Gulling; Cognitive Therapy of Anxiety Disorders by Alison Stalling). Objective Identify, challenge, and replace biased, fearful self-talk with positive, realistic, and empowering self-talk. Target Date: 2022-10-14 Frequency: Weekly  Progress: 25 Modality:  individual  Related Interventions Explore the client's schema and self-talk that mediate his/her fear response; assist him/her in challenging the biases; replace the distorted messages with reality-based alternatives and positive, realistic self-talk that will increase his/her self-confidence in coping with irrational fears (see Cognitive Therapy of Anxiety Disorders by Alison Stalling). Assign the client a homework exercise in which he/she identifies fearful self-talk, identifies biases in the self-talk, generates alternatives, and tests through behavioral experiments (or assign "Negative Thoughts Trigger Negative Feelings" in the Adult Psychotherapy Homework Planner by [facility]); review and reinforce success, providing corrective feedback toward improvement. Objective Learn and implement personal and interpersonal skills to reduce anxiety and improve interpersonal relationships. Target Date: 2022-10-14 Frequency: Weekly  Progress: 50 Modality: individual  Related Interventions Use instruction, modeling, and role-playing to build the client's general social, communication, and/or conflict resolution skills. Assign the client a homework exercise in which he/she implements communication skills training into his/her daily life (or assign "Restoring Socialization Comfort" in the Adult Psychotherapy Homework Planner by [facility]); review, reinforce success, and provide corrective feedback toward improvement. 7. Resolve the core conflict that is the source of anxiety. 8. Stabilize anxiety level while increasing ability to function on a daily basis.  Diagnosis:No diagnosis found.  Plan:   -next appointment will be Friday, December 08, 2021 at 9am.

## 2021-12-22 ENCOUNTER — Ambulatory Visit: Payer: Federal, State, Local not specified - PPO | Admitting: Professional

## 2021-12-25 ENCOUNTER — Ambulatory Visit (INDEPENDENT_AMBULATORY_CARE_PROVIDER_SITE_OTHER): Payer: Federal, State, Local not specified - PPO | Admitting: Professional

## 2021-12-25 ENCOUNTER — Encounter: Payer: Self-pay | Admitting: Professional

## 2021-12-25 ENCOUNTER — Ambulatory Visit: Payer: Federal, State, Local not specified - PPO | Admitting: Physician Assistant

## 2021-12-25 DIAGNOSIS — F411 Generalized anxiety disorder: Secondary | ICD-10-CM

## 2021-12-25 NOTE — Progress Notes (Signed)
[facility] Counselor/Therapist Progress Note  Patient ID: Kristen Nichols, MRN: [id number],    Date: 12/25/2021  Time Spent: 40 minutes 1103-1143am  Treatment Type: Individual Therapy  Risk Assessment: Danger to Self:  No Self-injurious Behavior: No Danger to Others: No  Subjective: This session was held via video teletherapy. The patient consented to video teletherapy and was located in her office during this session. She is aware it is the responsibility of the patient to secure confidentiality on her end of the session. The provider was in a private home office for the duration of this session.    The patient arrived on time for her webex appointment.  Issues addressed: 1-brother-in-law Gerald Stabs -is home and engaging in treatment -has had no contact with them -has extended herself to offer support as her sister needs 2-Christmas a-first time her entire family will be together in two years -first time son Wilfred Lacy will have seen his sister Aida Puffer in two years b-discussed how pt could diffuse to remove pressure of day -talk with Wilfred Lacy about making contact with sister before Christmas Day c-focusing on Christmas traditions -creating positive memories d-pt feels hopeful and realizes it could be awkward e-the role of matriarch -pt misses mother and has not enjoyed Christmas since her death -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 Achieve a reasonable level of family connectedness and harmony where members support, help, and are concerned for each other. 2. Decrease the level of present conflict with siblings while beginning to let go of or resolving past conflicts with them. Objective Identify own as well  as others' role in the family conflicts. Target Date: 2022-10-14 Frequency: Weekly  Progress: 75 Modality: individual  Related Interventions Confront the client when he/she is not taking responsibility for his/her role in the family conflict and reinforce the client for owning responsibility for his/her contribution to the conflict. Objective Describe the conflicts and the causes of conflicts between self, son, and siblings. Target Date: 2022-10-14 Frequency: Weekly  Progress: 50 Modality: individual  Related Interventions Give verbal permission for the client to have and express own feelings, thoughts, and perspectives in order to foster a sense of autonomy from family. Explore the nature of the client's family conflicts and their perceived causes. Objective Increase the number of positive family interactions by planning activities. Target Date: 2022-10-14 Frequency: Weekly  Progress: 90 Modality: individual  Related Interventions Assist the client in developing a list of positive family activities that promote harmony (e.g., bowling, fishing, playing table games, doing work projects). Schedule such activities into the family calendar. Objective Report an increase in resolving conflicts with siblings by talking calmly and assertively rather than aggressively and defensively. Target Date: 2022-10-14 Frequency: Weekly  Progress: 0 Modality: individual  Related Interventions Use role-playing, role reversal, modeling, and behavioral rehearsal to help the client develop assertive ways to resolve conflict with parents (recommend Your Perfect Right: Assertiveness and Equality in Your Life and Relationships by Neldon Newport). Objective Family members report a desire for and vision of a new sense of connectedness. Target Date: 2022-10-14 Frequency: Weekly  Progress: 0 Modality: individual  Related Interventions In a family session, assign the family the task of planning and going on an  outing or activity; in the following session, process  the experience with the family, giving positive reinforcement where appropriate. 3. Enhance ability to effectively cope with the full variety of life's worries and anxieties. 4. Learn and implement coping skills that result in a reduction of anxiety and worry, and improved daily functioning. 5. Reach a level of reduced tension, increased satisfaction, and improved communication with family. 6. Reduce overall frequency, intensity, and duration of the anxiety so that daily functioning is not impaired. Objective Verbalize an understanding of the cognitive, physiological, and behavioral components of anxiety and its treatment. Target Date: 2022-10-14 Frequency: Weekly  Progress: 75 Modality: individual  Related Interventions Discuss how generalized anxiety typically involves excessive worry about unrealistic threats, various bodily expressions of tension, overarousal, and hypervigilance, and avoidance of what is threatening that interact to maintain the problem (see Mastery of Your Anxiety and Worry: Therapist Guide by Corky Mull, and Barlow; Treating Generalized Anxiety Disorder by Rygh and Amparo Bristol). Discuss how treatment targets worry, anxiety symptoms, and avoidance to help the client manage worry effectively, reduce overarousal, and eliminate unnecessary avoidance. Objective Learn and implement problem-solving strategies for realistically addressing worries. Target Date: 2022-10-14 Frequency: Weekly  Progress: 0 Modality: individual  Related Interventions Teach the client problem-solving strategies involving specifically defining a problem, generating options for addressing it, evaluating the pros and cons of each option, selecting and implementing an optional action, and reevaluating and refining the action (or assign "Applying Problem-Solving to Interpersonal Conflict" in the Adult Psychotherapy Homework Planner by Bryn Gulling). Assign the  client a homework exercise in which he/she problem-solves a current problem (see Mastery of Your Anxiety and Worry: Workbook by Adora Fridge and Eliot Ford or Generalized Anxiety Disorder by Eather Colas, and Eliot Ford); review, reinforce success, and provide corrective feedback toward improvement. Objective Describe situations, thoughts, feelings, and actions associated with anxieties and worries, their impact on functioning, and attempts to resolve them. Target Date: 2022-10-14 Frequency: Weekly  Progress: 25 Modality: individual  Related Interventions Ask the client to describe his/her past experiences of anxiety and their impact on functioning; assess the focus, excessiveness, and uncontrollability of the worry and the type, frequency, intensity, and duration of his/her anxiety symptoms (consider using a structured interview such as The Anxiety Disorders Interview Schedule-Adult Version). Objective Learn and implement calming skills to reduce overall anxiety and manage anxiety symptoms. Target Date: 2022-10-14 Frequency: Weekly  Progress: 70 Modality: individual  Related Interventions Teach the client calming/relaxation skills (e.g., applied relaxation, progressive muscle relaxation, cue controlled relaxation; mindful breathing; biofeedback) and how to discriminate better between relaxation and tension; teach the client how to apply these skills to his/her daily life (e.g., New Directions in Progressive Muscle Relaxation by Casper Harrison, and Hazlett-Stevens; Treating Generalized Anxiety Disorder by Rygh and Amparo Bristol). Assign the client homework each session in which he/she practices relaxation exercises daily, gradually applying them progressively from non-anxiety-provoking to anxiety-provoking situations; review and reinforce success while providing corrective feedback toward improvement. Assign the client to read about progressive muscle relaxation and other calming strategies in relevant books or  treatment manuals (e.g., Progressive Relaxation Training by Gwynneth Aliment and Dani Gobble; Mastery of Your Anxiety and Worry: Workbook by Beckie Busing). Objective Learn and implement a strategy to limit the association between various environmental settings and worry, delaying the worry until a designated "worry time." Target Date: 2022-10-14 Frequency: Weekly  Progress: 0 Modality: individual  Related Interventions Explain the rationale for using a worry time as well as how it is to be used; agree upon and implement a worry time with the client. Teach the  client how to recognize, stop, and postpone worry to the agreed upon worry time using skills such as thought stopping, relaxation, and redirecting attention (or assign "Making Use of the Thought-Stopping Technique" and/or "Worry Time" in the Adult Psychotherapy Homework Planner by Jongsma to assist skill development); encourage use in daily life; review and reinforce success while providing corrective feedback toward improvement. Objective Verbalize an understanding of the role that cognitive biases play in excessive irrational worry and persistent anxiety symptoms. Target Date: 2022-10-14 Frequency: Weekly  Progress: 80 Modality: individual  Related Interventions Assist the client in analyzing his/her worries by examining potential biases such as the probability of the negative expectation occurring, the real consequences of it occurring, his/her ability to control the outcome, the worst possible outcome, and his/her ability to accept it (see "Analyze the Probability of a Feared Event" in the Adult Psychotherapy Homework Planner by Bryn Gulling; Cognitive Therapy of Anxiety Disorders by Alison Stalling). Objective Identify, challenge, and replace biased, fearful self-talk with positive, realistic, and empowering self-talk. Target Date: 2022-10-14 Frequency: Weekly  Progress: 25 Modality: individual  Related Interventions Explore the client's schema and  self-talk that mediate his/her fear response; assist him/her in challenging the biases; replace the distorted messages with reality-based alternatives and positive, realistic self-talk that will increase his/her self-confidence in coping with irrational fears (see Cognitive Therapy of Anxiety Disorders by Alison Stalling). Assign the client a homework exercise in which he/she identifies fearful self-talk, identifies biases in the self-talk, generates alternatives, and tests through behavioral experiments (or assign "Negative Thoughts Trigger Negative Feelings" in the Adult Psychotherapy Homework Planner by [facility]); review and reinforce success, providing corrective feedback toward improvement. Objective Learn and implement personal and interpersonal skills to reduce anxiety and improve interpersonal relationships. Target Date: 2022-10-14 Frequency: Weekly  Progress: 50 Modality: individual  Related Interventions Use instruction, modeling, and role-playing to build the client's general social, communication, and/or conflict resolution skills. Assign the client a homework exercise in which he/she implements communication skills training into his/her daily life (or assign "Restoring Socialization Comfort" in the Adult Psychotherapy Homework Planner by [facility]); review, reinforce success, and provide corrective feedback toward improvement. 7. Resolve the core conflict that is the source of anxiety. 8. Stabilize anxiety level while increasing ability to function on a daily basis.  Diagnosis:Generalized anxiety disorder  Plan:  -prioritizing family -leading as the matriarch -next appointment will be Friday, January 19, 2022 at 10am.

## 2022-01-19 ENCOUNTER — Ambulatory Visit: Payer: Federal, State, Local not specified - PPO | Admitting: Professional

## 2022-01-24 ENCOUNTER — Ambulatory Visit (INDEPENDENT_AMBULATORY_CARE_PROVIDER_SITE_OTHER): Payer: Federal, State, Local not specified - PPO | Admitting: Professional

## 2022-01-24 ENCOUNTER — Encounter: Payer: Self-pay | Admitting: Professional

## 2022-01-24 DIAGNOSIS — F411 Generalized anxiety disorder: Secondary | ICD-10-CM | POA: Diagnosis not present

## 2022-01-24 NOTE — Progress Notes (Signed)
[facility] Counselor/Therapist Progress Note  Patient ID: Kristen Nichols, MRN: [id number],    Date: 01/24/2022  Time Spent: 47 minutes 1-147pm  Treatment Type: Individual Therapy  Risk Assessment: Danger to Self:  No Self-injurious Behavior: No Danger to Others: No  Subjective: This session was held via video teletherapy. The patient consented to video teletherapy and was located in her office during this session. She is aware it is the responsibility of the patient to secure confidentiality on her end of the session. The provider was in a private home office for the duration of this session.    The patient arrived on time for her webex appointment.  Issues addressed: 1-homework- -prioritizing family -leading as the matriarch 2-family relationships a-issues between [place] and his sister Aida Puffer -he was angry and went off on a  tirade -pt tried to reason with him and was unsuccessful -Wilfred Lacy refused to come to family home for  -they have texted since Christmas but not had any in-person contact -since things happen with Aida Puffer in October 2022 contacts has been limited b-sister 's husband Gerald Stabs did not come since her husband has [place] -it was a good time, pt and her spouse did most of the cooking -she thinks "Daddy had a really good Christmas" -pt spent more time with her daughter-in-law and grandson Victorio Palm   -she was distant too -things with sister Cecille Rubin felt good and it was like nothing was wrong   -pt's husband offered to help as needed   -pt having contact via text and is not being pushy 3-mood and seasons -pt has felt down   -since conversing with her son "it took the wind out of my sails" -loss of zest since her mom passed -pt reports she does have a seasonal issue that has been there even before the death of her mother -strategies for structuring time during the winter months  2023 Treatment Plan Problems Addressed  Anxiety, Family Conflict Goals 1.  Achieve a reasonable level of family connectedness and harmony where members support, help, and are concerned for each other. 2. Decrease the level of present conflict with siblings while beginning to let go of or resolving past conflicts with them. Objective Identify own as well as others' role in the family conflicts. Target Date: 2022-10-14 Frequency: Weekly  Progress: 75 Modality: individual  Related Interventions Confront the client when he/she is not taking responsibility for his/her role in the family conflict and reinforce the client for owning responsibility for his/her contribution to the conflict. Objective Describe the conflicts and the causes of conflicts between self, son, and siblings. Target Date: 2022-10-14 Frequency: Weekly  Progress: 50 Modality: individual  Related Interventions Give verbal permission for the client to have and express own feelings, thoughts, and perspectives in order to foster a sense of autonomy from family. Explore the nature of the client's family conflicts and their perceived causes. Objective Increase the number of positive family interactions by planning activities. Target Date: 2022-10-14 Frequency: Weekly  Progress: 90 Modality: individual  Related Interventions Assist the client in developing a list of positive family activities that promote harmony (e.g., bowling, fishing, playing table games, doing work projects). Schedule such activities into the family calendar. Objective Report an increase in resolving conflicts with siblings by talking calmly and assertively rather than aggressively and defensively. Target Date: 2022-10-14 Frequency: Weekly  Progress: 0 Modality: individual  Related Interventions Use role-playing, role reversal, modeling, and behavioral rehearsal to help the client develop assertive ways to resolve conflict with  parents (recommend Your Perfect Right: Assertiveness and Equality in Your Life and Relationships by Neldon Newport). Objective Family members report a desire for and vision of a new sense of connectedness. Target Date: 2022-10-14 Frequency: Weekly  Progress: 0 Modality: individual  Related Interventions In a family session, assign the family the task of planning and going on an outing or activity; in the following session, process the experience with the family, giving positive reinforcement where appropriate. 3. Enhance ability to effectively cope with the full variety of life's worries and anxieties. 4. Learn and implement coping skills that result in a reduction of anxiety and worry, and improved daily functioning. 5. Reach a level of reduced tension, increased satisfaction, and improved communication with family. 6. Reduce overall frequency, intensity, and duration of the anxiety so that daily functioning is not impaired. Objective Verbalize an understanding of the cognitive, physiological, and behavioral components of anxiety and its treatment. Target Date: 2022-10-14 Frequency: Weekly  Progress: 75 Modality: individual  Related Interventions Discuss how generalized anxiety typically involves excessive worry about unrealistic threats, various bodily expressions of tension, overarousal, and hypervigilance, and avoidance of what is threatening that interact to maintain the problem (see Mastery of Your Anxiety and Worry: Therapist Guide by Corky Mull, and Barlow; Treating Generalized Anxiety Disorder by Rygh and Amparo Bristol). Discuss how treatment targets worry, anxiety symptoms, and avoidance to help the client manage worry effectively, reduce overarousal, and eliminate unnecessary avoidance. Objective Learn and implement problem-solving strategies for realistically addressing worries. Target Date: 2022-10-14 Frequency: Weekly  Progress: 0 Modality: individual  Related Interventions Teach the client problem-solving strategies involving specifically defining a problem, generating options for  addressing it, evaluating the pros and cons of each option, selecting and implementing an optional action, and reevaluating and refining the action (or assign "Applying Problem-Solving to Interpersonal Conflict" in the Adult Psychotherapy Homework Planner by Bryn Gulling). Assign the client a homework exercise in which he/she problem-solves a current problem (see Mastery of Your Anxiety and Worry: Workbook by Adora Fridge and Eliot Ford or Generalized Anxiety Disorder by Eather Colas, and Eliot Ford); review, reinforce success, and provide corrective feedback toward improvement. Objective Describe situations, thoughts, feelings, and actions associated with anxieties and worries, their impact on functioning, and attempts to resolve them. Target Date: 2022-10-14 Frequency: Weekly  Progress: 25 Modality: individual  Related Interventions Ask the client to describe his/her past experiences of anxiety and their impact on functioning; assess the focus, excessiveness, and uncontrollability of the worry and the type, frequency, intensity, and duration of his/her anxiety symptoms (consider using a structured interview such as The Anxiety Disorders Interview Schedule-Adult Version). Objective Learn and implement calming skills to reduce overall anxiety and manage anxiety symptoms. Target Date: 2022-10-14 Frequency: Weekly  Progress: 70 Modality: individual  Related Interventions Teach the client calming/relaxation skills (e.g., applied relaxation, progressive muscle relaxation, cue controlled relaxation; mindful breathing; biofeedback) and how to discriminate better between relaxation and tension; teach the client how to apply these skills to his/her daily life (e.g., New Directions in Progressive Muscle Relaxation by Casper Harrison, and Hazlett-Stevens; Treating Generalized Anxiety Disorder by Rygh and Amparo Bristol). Assign the client homework each session in which he/she practices relaxation exercises daily, gradually  applying them progressively from non-anxiety-provoking to anxiety-provoking situations; review and reinforce success while providing corrective feedback toward improvement. Assign the client to read about progressive muscle relaxation and other calming strategies in relevant books or treatment manuals (e.g., Progressive Relaxation Training by Gwynneth Aliment and Dani Gobble; Mastery of Your Anxiety and Worry: Workbook by  Adora Fridge and [place]). Objective Learn and implement a strategy to limit the association between various environmental settings and worry, delaying the worry until a designated "worry time." Target Date: 2022-10-14 Frequency: Weekly  Progress: 0 Modality: individual  Related Interventions Explain the rationale for using a worry time as well as how it is to be used; agree upon and implement a worry time with the client. Teach the client how to recognize, stop, and postpone worry to the agreed upon worry time using skills such as thought stopping, relaxation, and redirecting attention (or assign "Making Use of the Thought-Stopping Technique" and/or "Worry Time" in the Adult Psychotherapy Homework Planner by Jongsma to assist skill development); encourage use in daily life; review and reinforce success while providing corrective feedback toward improvement. Objective Verbalize an understanding of the role that cognitive biases play in excessive irrational worry and persistent anxiety symptoms. Target Date: 2022-10-14 Frequency: Weekly  Progress: 80 Modality: individual  Related Interventions Assist the client in analyzing his/her worries by examining potential biases such as the probability of the negative expectation occurring, the real consequences of it occurring, his/her ability to control the outcome, the worst possible outcome, and his/her ability to accept it (see "Analyze the Probability of a Feared Event" in the Adult Psychotherapy Homework Planner by Bryn Gulling; Cognitive Therapy of Anxiety  Disorders by Alison Stalling). Objective Identify, challenge, and replace biased, fearful self-talk with positive, realistic, and empowering self-talk. Target Date: 2022-10-14 Frequency: Weekly  Progress: 25 Modality: individual  Related Interventions Explore the client's schema and self-talk that mediate his/her fear response; assist him/her in challenging the biases; replace the distorted messages with reality-based alternatives and positive, realistic self-talk that will increase his/her self-confidence in coping with irrational fears (see Cognitive Therapy of Anxiety Disorders by Alison Stalling). Assign the client a homework exercise in which he/she identifies fearful self-talk, identifies biases in the self-talk, generates alternatives, and tests through behavioral experiments (or assign "Negative Thoughts Trigger Negative Feelings" in the Adult Psychotherapy Homework Planner by [facility]); review and reinforce success, providing corrective feedback toward improvement. Objective Learn and implement personal and interpersonal skills to reduce anxiety and improve interpersonal relationships. Target Date: 2022-10-14 Frequency: Weekly  Progress: 50 Modality: individual  Related Interventions Use instruction, modeling, and role-playing to build the client's general social, communication, and/or conflict resolution skills. Assign the client a homework exercise in which he/she implements communication skills training into his/her daily life (or assign "Restoring Socialization Comfort" in the Adult Psychotherapy Homework Planner by [facility]); review, reinforce success, and provide corrective feedback toward improvement. 7. Resolve the core conflict that is the source of anxiety. 8. Stabilize anxiety level while increasing ability to function on a daily basis.  Diagnosis:Generalized anxiety disorder  Plan:  -structure time at home -next appointment will be Friday, February 02, 2022 at 10am.

## 2022-02-02 ENCOUNTER — Ambulatory Visit: Payer: Federal, State, Local not specified - PPO | Admitting: Professional

## 2022-02-06 ENCOUNTER — Encounter: Payer: Self-pay | Admitting: Professional

## 2022-02-06 ENCOUNTER — Ambulatory Visit (INDEPENDENT_AMBULATORY_CARE_PROVIDER_SITE_OTHER): Payer: Federal, State, Local not specified - PPO | Admitting: Professional

## 2022-02-06 DIAGNOSIS — F411 Generalized anxiety disorder: Secondary | ICD-10-CM

## 2022-02-06 NOTE — Progress Notes (Signed)
[facility] Counselor/Therapist Progress Note  Patient ID: Kristen Nichols, MRN: [id number],    Date: 02/06/2022  Time Spent: 54 minutes 3-354pm  Treatment Type: Individual Therapy  Risk Assessment: Danger to Self:  No Self-injurious Behavior: No Danger to Others: No  Subjective: This session was held via video teletherapy. The patient consented to video teletherapy and was located in her office during this session. She is aware it is the responsibility of the patient to secure confidentiality on her end of the session. The provider was in a private home office for the duration of this session.    The patient arrived on time for her webex appointment.  Issues addressed: 1-homework- -structure time at home 2-Kristen Nichols a-pt has a lot of why's related to Kristen Nichols b-pt/spouse have been managing her money -she got a paper check but then did not keep appointment to give her mom the money for Kristen Nichols -she appears to be deceptive but is not telling the truth and a pattern of telling them she is working -takes up time in her head Pt admits that she doesn't know how ot separate herself from the emotions and feelings she has with Kristen Nichols C-codependency -how to put your needs, and the needs of your marriage ahead of Kristen Nichols -allow Kristen Nichols to make her choices and do not rescue -educated  2023 Treatment Plan Problems Addressed  Anxiety, Family Conflict Goals 1. Achieve a reasonable level of family connectedness and harmony where members support, help, and are concerned for each other. 2. Decrease the level of present conflict with siblings while beginning to let go of or resolving past conflicts with them. Objective Identify own as well as others' role in the family conflicts. Target Date: 2022-10-14 Frequency: Weekly  Progress: 75 Modality: individual  Related Interventions Confront the client when he/she is not taking responsibility for his/her role in the family conflict and  reinforce the client for owning responsibility for his/her contribution to the conflict. Objective Describe the conflicts and the causes of conflicts between self, son, and siblings. Target Date: 2022-10-14 Frequency: Weekly  Progress: 50 Modality: individual  Related Interventions Give verbal permission for the client to have and express own feelings, thoughts, and perspectives in order to foster a sense of autonomy from family. Explore the nature of the client's family conflicts and their perceived causes. Objective Increase the number of positive family interactions by planning activities. Target Date: 2022-10-14 Frequency: Weekly  Progress: 90 Modality: individual  Related Interventions Assist the client in developing a list of positive family activities that promote harmony (e.g., bowling, fishing, playing table games, doing work projects). Schedule such activities into the family calendar. Objective Report an increase in resolving conflicts with siblings by talking calmly and assertively rather than aggressively and defensively. Target Date: 2022-10-14 Frequency: Weekly  Progress: 0 Modality: individual  Related Interventions Use role-playing, role reversal, modeling, and behavioral rehearsal to help the client develop assertive ways to resolve conflict with parents (recommend Your Perfect Right: Assertiveness and Equality in Your Life and Relationships by Neldon Newport). Objective Family members report a desire for and vision of a new sense of connectedness. Target Date: 2022-10-14 Frequency: Weekly  Progress: 0 Modality: individual  Related Interventions In a family session, assign the family the task of planning and going on an outing or activity; in the following session, process the experience with the family, giving positive reinforcement where appropriate. 3. Enhance ability to effectively cope with the full variety of life's worries and anxieties. 4. Learn  and implement  coping skills that result in a reduction of anxiety and worry, and improved daily functioning. 5. Reach a level of reduced tension, increased satisfaction, and improved communication with family. 6. Reduce overall frequency, intensity, and duration of the anxiety so that daily functioning is not impaired. Objective Verbalize an understanding of the cognitive, physiological, and behavioral components of anxiety and its treatment. Target Date: 2022-10-14 Frequency: Weekly  Progress: 75 Modality: individual  Related Interventions Discuss how generalized anxiety typically involves excessive worry about unrealistic threats, various bodily expressions of tension, overarousal, and hypervigilance, and avoidance of what is threatening that interact to maintain the problem (see Mastery of Your Anxiety and Worry: Therapist Guide by Corky Mull, and Barlow; Treating Generalized Anxiety Disorder by Rygh and Amparo Bristol). Discuss how treatment targets worry, anxiety symptoms, and avoidance to help the client manage worry effectively, reduce overarousal, and eliminate unnecessary avoidance. Objective Learn and implement problem-solving strategies for realistically addressing worries. Target Date: 2022-10-14 Frequency: Weekly  Progress: 0 Modality: individual  Related Interventions Teach the client problem-solving strategies involving specifically defining a problem, generating options for addressing it, evaluating the pros and cons of each option, selecting and implementing an optional action, and reevaluating and refining the action (or assign "Applying Problem-Solving to Interpersonal Conflict" in the Adult Psychotherapy Homework Planner by Bryn Gulling). Assign the client a homework exercise in which he/she problem-solves a current problem (see Mastery of Your Anxiety and Worry: Workbook by Adora Fridge and Eliot Ford or Generalized Anxiety Disorder by Eather Colas, and Eliot Ford); review, reinforce success, and provide  corrective feedback toward improvement. Objective Describe situations, thoughts, feelings, and actions associated with anxieties and worries, their impact on functioning, and attempts to resolve them. Target Date: 2022-10-14 Frequency: Weekly  Progress: 25 Modality: individual  Related Interventions Ask the client to describe his/her past experiences of anxiety and their impact on functioning; assess the focus, excessiveness, and uncontrollability of the worry and the type, frequency, intensity, and duration of his/her anxiety symptoms (consider using a structured interview such as The Anxiety Disorders Interview Schedule-Adult Version). Objective Learn and implement calming skills to reduce overall anxiety and manage anxiety symptoms. Target Date: 2022-10-14 Frequency: Weekly  Progress: 70 Modality: individual  Related Interventions Teach the client calming/relaxation skills (e.g., applied relaxation, progressive muscle relaxation, cue controlled relaxation; mindful breathing; biofeedback) and how to discriminate better between relaxation and tension; teach the client how to apply these skills to his/her daily life (e.g., New Directions in Progressive Muscle Relaxation by Casper Harrison, and Hazlett-Stevens; Treating Generalized Anxiety Disorder by Rygh and Amparo Bristol). Assign the client homework each session in which he/she practices relaxation exercises daily, gradually applying them progressively from non-anxiety-provoking to anxiety-provoking situations; review and reinforce success while providing corrective feedback toward improvement. Assign the client to read about progressive muscle relaxation and other calming strategies in relevant books or treatment manuals (e.g., Progressive Relaxation Training by Gwynneth Aliment and Dani Gobble; Mastery of Your Anxiety and Worry: Workbook by Beckie Busing). Objective Learn and implement a strategy to limit the association between various environmental  settings and worry, delaying the worry until a designated "worry time." Target Date: 2022-10-14 Frequency: Weekly  Progress: 0 Modality: individual  Related Interventions Explain the rationale for using a worry time as well as how it is to be used; agree upon and implement a worry time with the client. Teach the client how to recognize, stop, and postpone worry to the agreed upon worry time using skills such as thought stopping, relaxation, and redirecting attention (or assign "  Making Use of the Thought-Stopping Technique" and/or "Worry Time" in the Adult Psychotherapy Homework Planner by Jongsma to assist skill development); encourage use in daily life; review and reinforce success while providing corrective feedback toward improvement. Objective Verbalize an understanding of the role that cognitive biases play in excessive irrational worry and persistent anxiety symptoms. Target Date: 2022-10-14 Frequency: Weekly  Progress: 80 Modality: individual  Related Interventions Assist the client in analyzing his/her worries by examining potential biases such as the probability of the negative expectation occurring, the real consequences of it occurring, his/her ability to control the outcome, the worst possible outcome, and his/her ability to accept it (see "Analyze the Probability of a Feared Event" in the Adult Psychotherapy Homework Planner by Bryn Gulling; Cognitive Therapy of Anxiety Disorders by Alison Stalling). Objective Identify, challenge, and replace biased, fearful self-talk with positive, realistic, and empowering self-talk. Target Date: 2022-10-14 Frequency: Weekly  Progress: 25 Modality: individual  Related Interventions Explore the client's schema and self-talk that mediate his/her fear response; assist him/her in challenging the biases; replace the distorted messages with reality-based alternatives and positive, realistic self-talk that will increase his/her self-confidence in coping with  irrational fears (see Cognitive Therapy of Anxiety Disorders by Alison Stalling). Assign the client a homework exercise in which he/she identifies fearful self-talk, identifies biases in the self-talk, generates alternatives, and tests through behavioral experiments (or assign "Negative Thoughts Trigger Negative Feelings" in the Adult Psychotherapy Homework Planner by [facility]); review and reinforce success, providing corrective feedback toward improvement. Objective Learn and implement personal and interpersonal skills to reduce anxiety and improve interpersonal relationships. Target Date: 2022-10-14 Frequency: Weekly  Progress: 50 Modality: individual  Related Interventions Use instruction, modeling, and role-playing to build the client's general social, communication, and/or conflict resolution skills. Assign the client a homework exercise in which he/she implements communication skills training into his/her daily life (or assign "Restoring Socialization Comfort" in the Adult Psychotherapy Homework Planner by [facility]); review, reinforce success, and provide corrective feedback toward improvement. 7. Resolve the core conflict that is the source of anxiety. 8. Stabilize anxiety level while increasing ability to function on a daily basis.  Diagnosis:Generalized anxiety disorder  Plan:  -read article on Enabling vs. Helping the Addicted Adult Child -next appointment will be Monday, March 20, 2022 at 11am.

## 2022-02-19 ENCOUNTER — Ambulatory Visit (INDEPENDENT_AMBULATORY_CARE_PROVIDER_SITE_OTHER): Payer: Federal, State, Local not specified - PPO | Admitting: Professional

## 2022-02-19 ENCOUNTER — Encounter: Payer: Self-pay | Admitting: Professional

## 2022-02-19 DIAGNOSIS — F411 Generalized anxiety disorder: Secondary | ICD-10-CM

## 2022-02-19 NOTE — Progress Notes (Signed)
[facility] Counselor/Therapist Progress Note  Patient ID: Kristen Nichols, MRN: [id number],    Date: 02/19/2022  Time Spent: 46 minutes 1101-1147am  Treatment Type: Individual Therapy  Risk Assessment: Danger to Self:  No Self-injurious Behavior: No Danger to Others: No  Subjective: This session was held via video teletherapy. The patient consented to video teletherapy and was located in her office during this session. She is aware it is the responsibility of the patient to secure confidentiality on her end of the session. The provider was in a private home office for the duration of this session.    The patient arrived on time for her webex appointment.  Issues addressed: 1-homework- -read article on Enabling vs. Helping the Addicted Adult Child 2-family daughter Kristen Nichols a-is pregnant and found out on January 12th -her boyfriend is a much longer recovered addict 3-son Kristen Nichols a-has received a promotion that will put him and his family in [place] -he will have his own service center for [place] -pt will be closest grandparent b-son shared that he and his wife are pregnant 4-coping with anxiety -spending time in passions -spent time at horse barn -staying focused on at work -more intentional with self-care when not at work  [place], Family Conflict Goals 1. Achieve a reasonable level of family connectedness and harmony where members support, help, and are concerned for each other. 2. Decrease the level of present conflict with siblings while beginning to let go of or resolving past conflicts with them. Objective Identify own as well as others' role in the family conflicts. Target Date: 2022-10-14 Frequency: Weekly  Progress: 75 Modality: individual  Related Interventions Confront the client when he/she is not taking responsibility for his/her role in the family conflict and reinforce the client for owning  responsibility for his/her contribution to the conflict. Objective Describe the conflicts and the causes of conflicts between self, son, and siblings. Target Date: 2022-10-14 Frequency: Weekly  Progress: 50 Modality: individual  Related Interventions Give verbal permission for the client to have and express own feelings, thoughts, and perspectives in order to foster a sense of autonomy from family. Explore the nature of the client's family conflicts and their perceived causes. Objective Increase the number of positive family interactions by planning activities. Target Date: 2022-10-14 Frequency: Weekly  Progress: 90 Modality: individual  Related Interventions Assist the client in developing a list of positive family activities that promote harmony (e.g., bowling, fishing, playing table games, doing work projects). Schedule such activities into the family calendar. Objective Report an increase in resolving conflicts with siblings by talking calmly and assertively rather than aggressively and defensively. Target Date: 2022-10-14 Frequency: Weekly  Progress: 0 Modality: individual  Related Interventions Use role-playing, role reversal, modeling, and behavioral rehearsal to help the client develop assertive ways to resolve conflict with parents (recommend Your Perfect Right: Assertiveness and Equality in Your Life and Relationships by Neldon Newport). Objective Family members report a desire for and vision of a new sense of connectedness. Target Date: 2022-10-14 Frequency: Weekly  Progress: 0 Modality: individual  Related Interventions In a family session, assign the family the task of planning and going on an outing or activity; in the following session, process the experience with the family, giving positive reinforcement where appropriate. 3. Enhance ability to effectively cope with the full variety of life's worries and anxieties. 4. Learn and implement coping skills that result in a  reduction of anxiety and worry, and improved daily functioning. 5.  Reach a level of reduced tension, increased satisfaction, and improved communication with family. 6. Reduce overall frequency, intensity, and duration of the anxiety so that daily functioning is not impaired. Objective Verbalize an understanding of the cognitive, physiological, and behavioral components of anxiety and its treatment. Target Date: 2022-10-14 Frequency: Weekly  Progress: 75 Modality: individual  Related Interventions Discuss how generalized anxiety typically involves excessive worry about unrealistic threats, various bodily expressions of tension, overarousal, and hypervigilance, and avoidance of what is threatening that interact to maintain the problem (see Mastery of Your Anxiety and Worry: Therapist Guide by Corky Mull, and Barlow; Treating Generalized Anxiety Disorder by Rygh and Amparo Bristol). Discuss how treatment targets worry, anxiety symptoms, and avoidance to help the client manage worry effectively, reduce overarousal, and eliminate unnecessary avoidance. Objective Learn and implement problem-solving strategies for realistically addressing worries. Target Date: 2022-10-14 Frequency: Weekly  Progress: 0 Modality: individual  Related Interventions Teach the client problem-solving strategies involving specifically defining a problem, generating options for addressing it, evaluating the pros and cons of each option, selecting and implementing an optional action, and reevaluating and refining the action (or assign "Applying Problem-Solving to Interpersonal Conflict" in the Adult Psychotherapy Homework Planner by Bryn Gulling). Assign the client a homework exercise in which he/she problem-solves a current problem (see Mastery of Your Anxiety and Worry: Workbook by Adora Fridge and Eliot Ford or Generalized Anxiety Disorder by Eather Colas, and Eliot Ford); review, reinforce success, and provide corrective feedback toward  improvement. Objective Describe situations, thoughts, feelings, and actions associated with anxieties and worries, their impact on functioning, and attempts to resolve them. Target Date: 2022-10-14 Frequency: Weekly  Progress: 25 Modality: individual  Related Interventions Ask the client to describe his/her past experiences of anxiety and their impact on functioning; assess the focus, excessiveness, and uncontrollability of the worry and the type, frequency, intensity, and duration of his/her anxiety symptoms (consider using a structured interview such as The Anxiety Disorders Interview Schedule-Adult Version). Objective Learn and implement calming skills to reduce overall anxiety and manage anxiety symptoms. Target Date: 2022-10-14 Frequency: Weekly  Progress: 70 Modality: individual  Related Interventions Teach the client calming/relaxation skills (e.g., applied relaxation, progressive muscle relaxation, cue controlled relaxation; mindful breathing; biofeedback) and how to discriminate better between relaxation and tension; teach the client how to apply these skills to his/her daily life (e.g., New Directions in Progressive Muscle Relaxation by Casper Harrison, and Hazlett-Stevens; Treating Generalized Anxiety Disorder by Rygh and Amparo Bristol). Assign the client homework each session in which he/she practices relaxation exercises daily, gradually applying them progressively from non-anxiety-provoking to anxiety-provoking situations; review and reinforce success while providing corrective feedback toward improvement. Assign the client to read about progressive muscle relaxation and other calming strategies in relevant books or treatment manuals (e.g., Progressive Relaxation Training by Gwynneth Aliment and Dani Gobble; Mastery of Your Anxiety and Worry: Workbook by Beckie Busing). Objective Learn and implement a strategy to limit the association between various environmental settings and worry, delaying  the worry until a designated "worry time." Target Date: 2022-10-14 Frequency: Weekly  Progress: 0 Modality: individual  Related Interventions Explain the rationale for using a worry time as well as how it is to be used; agree upon and implement a worry time with the client. Teach the client how to recognize, stop, and postpone worry to the agreed upon worry time using skills such as thought stopping, relaxation, and redirecting attention (or assign "Making Use of the Thought-Stopping Technique" and/or "Worry Time" in the Adult Psychotherapy Homework Planner by Jongsma to assist  skill development); encourage use in daily life; review and reinforce success while providing corrective feedback toward improvement. Objective Verbalize an understanding of the role that cognitive biases play in excessive irrational worry and persistent anxiety symptoms. Target Date: 2022-10-14 Frequency: Weekly  Progress: 80 Modality: individual  Related Interventions Assist the client in analyzing his/her worries by examining potential biases such as the probability of the negative expectation occurring, the real consequences of it occurring, his/her ability to control the outcome, the worst possible outcome, and his/her ability to accept it (see "Analyze the Probability of a Feared Event" in the Adult Psychotherapy Homework Planner by Bryn Gulling; Cognitive Therapy of Anxiety Disorders by Alison Stalling). Objective Identify, challenge, and replace biased, fearful self-talk with positive, realistic, and empowering self-talk. Target Date: 2022-10-14 Frequency: Weekly  Progress: 25 Modality: individual  Related Interventions Explore the client's schema and self-talk that mediate his/her fear response; assist him/her in challenging the biases; replace the distorted messages with reality-based alternatives and positive, realistic self-talk that will increase his/her self-confidence in coping with irrational fears (see Cognitive  Therapy of Anxiety Disorders by Alison Stalling). Assign the client a homework exercise in which he/she identifies fearful self-talk, identifies biases in the self-talk, generates alternatives, and tests through behavioral experiments (or assign "Negative Thoughts Trigger Negative Feelings" in the Adult Psychotherapy Homework Planner by [facility]); review and reinforce success, providing corrective feedback toward improvement. Objective Learn and implement personal and interpersonal skills to reduce anxiety and improve interpersonal relationships. Target Date: 2022-10-14 Frequency: Weekly  Progress: 50 Modality: individual  Related Interventions Use instruction, modeling, and role-playing to build the client's general social, communication, and/or conflict resolution skills. Assign the client a homework exercise in which he/she implements communication skills training into his/her daily life (or assign "Restoring Socialization Comfort" in the Adult Psychotherapy Homework Planner by [facility]); review, reinforce success, and provide corrective feedback toward improvement. 7. Resolve the core conflict that is the source of anxiety. 8. Stabilize anxiety level while increasing ability to function on a daily basis.  Diagnosis:Generalized anxiety disorder  Plan:  -next appointment will be Friday, March 09, 2022 at 10am.

## 2022-03-02 ENCOUNTER — Ambulatory Visit: Payer: Federal, State, Local not specified - PPO | Admitting: Professional

## 2022-03-09 ENCOUNTER — Ambulatory Visit: Payer: Federal, State, Local not specified - PPO | Admitting: Professional

## 2022-03-16 ENCOUNTER — Ambulatory Visit (INDEPENDENT_AMBULATORY_CARE_PROVIDER_SITE_OTHER): Payer: Federal, State, Local not specified - PPO | Admitting: Professional

## 2022-03-16 ENCOUNTER — Encounter: Payer: Self-pay | Admitting: Professional

## 2022-03-16 DIAGNOSIS — F411 Generalized anxiety disorder: Secondary | ICD-10-CM | POA: Diagnosis not present

## 2022-03-16 NOTE — Progress Notes (Signed)
[facility] Counselor/Therapist Progress Note  Patient ID: Kristen Nichols, MRN: [id number],    Date: 03/16/2022  Time Spent: 43 minutes 1004-1147am  Treatment Type: Individual Therapy  Risk Assessment: Danger to Self:  No Self-injurious Behavior: No Danger to Others: No  Subjective: This session was held via video teletherapy. The patient consented to video teletherapy and was located in her office during this session. She is aware it is the responsibility of the patient to secure confidentiality on her end of the session. The provider was in a private home office for the duration of this session.    The patient arrived on time for her webex appointment.  Issues addressed: 1-home renovations -living in a suitcase, [place] -staying with her sister Jenny Reichmann some of the time -they are facetiming with sister Cecille Rubinthe general") and brother in law Gerald Stabs   -pt admits the interactions appear awkward   -pt fears saying the wrong thing   -Jenny Reichmann appears to try and keep the conversation going smoothly -she and her husband have been more moody since they have been displaced   -it is to be done by Tuesday but patient is unsure if that is true   -removing popcorn ceilings and painting entire downstairs and rebuilding floors   -they will be coming back in April and just paint the upstairs 2-family a-stepmother diagnosed with pancreatic cancer and it has already spread to her liver -she is seeing the same oncologist as her father; he had lung cancer during Covid and is not clear b-her brother-in-law has cancer and they are trying to support Cecille Rubin c-[name] had her first ultrasound -son's home has sold and he, his pregnant wife and pt's granddaughter is moving to [place] d-pt admits she cannot control the relationship with her children she has to manage herself 4-mood a-not sleeping well -feeling sad about her stepmother's cancer -pt admits  she feels angry -pt having a drink nightly and it is bothering her 5-coping -walk when staying at the [facility] -going to spend barn time this afternoon -do her yard work when staying with her sister -   2023 Treatment Plan Problems Addressed  Anxiety, Family Conflict Goals 1. Achieve a reasonable level of family connectedness and harmony where members support, help, and are concerned for each other. 2. Decrease the level of present conflict with siblings while beginning to let go of or resolving past conflicts with them. Objective Identify own as well as others' role in the family conflicts. Target Date: 2022-10-14 Frequency: Weekly  Progress: 75 Modality: individual  Related Interventions Confront the client when he/she is not taking responsibility for his/her role in the family conflict and reinforce the client for owning responsibility for his/her contribution to the conflict. Objective Describe the conflicts and the causes of conflicts between self, son, and siblings. Target Date: 2022-10-14 Frequency: Weekly  Progress: 50 Modality: individual  Related Interventions Give verbal permission for the client to have and express own feelings, thoughts, and perspectives in order to foster a sense of autonomy from family. Explore the nature of the client's family conflicts and their perceived causes. Objective Increase the number of positive family interactions by planning activities. Target Date: 2022-10-14 Frequency: Weekly  Progress: 90 Modality: individual  Related Interventions Assist the client in developing a list of positive family activities that promote harmony (e.g., bowling, fishing, playing table games, doing work projects). Schedule such activities into the family calendar. Objective Report an increase in resolving  conflicts with siblings by talking calmly and assertively rather than aggressively and defensively. Target Date: 2022-10-14 Frequency: Weekly  Progress: 0  Modality: individual  Related Interventions Use role-playing, role reversal, modeling, and behavioral rehearsal to help the client develop assertive ways to resolve conflict with parents (recommend Your Perfect Right: Assertiveness and Equality in Your Life and Relationships by Neldon Newport). Objective Family members report a desire for and vision of a new sense of connectedness. Target Date: 2022-10-14 Frequency: Weekly  Progress: 0 Modality: individual  Related Interventions In a family session, assign the family the task of planning and going on an outing or activity; in the following session, process the experience with the family, giving positive reinforcement where appropriate. 3. Enhance ability to effectively cope with the full variety of life's worries and anxieties. 4. Learn and implement coping skills that result in a reduction of anxiety and worry, and improved daily functioning. 5. Reach a level of reduced tension, increased satisfaction, and improved communication with family. 6. Reduce overall frequency, intensity, and duration of the anxiety so that daily functioning is not impaired. Objective Verbalize an understanding of the cognitive, physiological, and behavioral components of anxiety and its treatment. Target Date: 2022-10-14 Frequency: Weekly  Progress: 75 Modality: individual  Related Interventions Discuss how generalized anxiety typically involves excessive worry about unrealistic threats, various bodily expressions of tension, overarousal, and hypervigilance, and avoidance of what is threatening that interact to maintain the problem (see Mastery of Your Anxiety and Worry: Therapist Guide by Corky Mull, and Barlow; Treating Generalized Anxiety Disorder by Rygh and Amparo Bristol). Discuss how treatment targets worry, anxiety symptoms, and avoidance to help the client manage worry effectively, reduce overarousal, and eliminate unnecessary avoidance. Objective Learn  and implement problem-solving strategies for realistically addressing worries. Target Date: 2022-10-14 Frequency: Weekly  Progress: 0 Modality: individual  Related Interventions Teach the client problem-solving strategies involving specifically defining a problem, generating options for addressing it, evaluating the pros and cons of each option, selecting and implementing an optional action, and reevaluating and refining the action (or assign "Applying Problem-Solving to Interpersonal Conflict" in the Adult Psychotherapy Homework Planner by Bryn Gulling). Assign the client a homework exercise in which he/she problem-solves a current problem (see Mastery of Your Anxiety and Worry: Workbook by Adora Fridge and Eliot Ford or Generalized Anxiety Disorder by Eather Colas, and Eliot Ford); review, reinforce success, and provide corrective feedback toward improvement. Objective Describe situations, thoughts, feelings, and actions associated with anxieties and worries, their impact on functioning, and attempts to resolve them. Target Date: 2022-10-14 Frequency: Weekly  Progress: 25 Modality: individual  Related Interventions Ask the client to describe his/her past experiences of anxiety and their impact on functioning; assess the focus, excessiveness, and uncontrollability of the worry and the type, frequency, intensity, and duration of his/her anxiety symptoms (consider using a structured interview such as The Anxiety Disorders Interview Schedule-Adult Version). Objective Learn and implement calming skills to reduce overall anxiety and manage anxiety symptoms. Target Date: 2022-10-14 Frequency: Weekly  Progress: 70 Modality: individual  Related Interventions Teach the client calming/relaxation skills (e.g., applied relaxation, progressive muscle relaxation, cue controlled relaxation; mindful breathing; biofeedback) and how to discriminate better between relaxation and tension; teach the client how to apply these skills to  his/her daily life (e.g., New Directions in Progressive Muscle Relaxation by Casper Harrison, and Hazlett-Stevens; Treating Generalized Anxiety Disorder by Rygh and Amparo Bristol). Assign the client homework each session in which he/she practices relaxation exercises daily, gradually applying them progressively from non-anxiety-provoking to anxiety-provoking situations;  review and reinforce success while providing corrective feedback toward improvement. Assign the client to read about progressive muscle relaxation and other calming strategies in relevant books or treatment manuals (e.g., Progressive Relaxation Training by Gwynneth Aliment and Dani Gobble; Mastery of Your Anxiety and Worry: Workbook by Beckie Busing). Objective Learn and implement a strategy to limit the association between various environmental settings and worry, delaying the worry until a designated "worry time." Target Date: 2022-10-14 Frequency: Weekly  Progress: 0 Modality: individual  Related Interventions Explain the rationale for using a worry time as well as how it is to be used; agree upon and implement a worry time with the client. Teach the client how to recognize, stop, and postpone worry to the agreed upon worry time using skills such as thought stopping, relaxation, and redirecting attention (or assign "Making Use of the Thought-Stopping Technique" and/or "Worry Time" in the Adult Psychotherapy Homework Planner by Jongsma to assist skill development); encourage use in daily life; review and reinforce success while providing corrective feedback toward improvement. Objective Verbalize an understanding of the role that cognitive biases play in excessive irrational worry and persistent anxiety symptoms. Target Date: 2022-10-14 Frequency: Weekly  Progress: 80 Modality: individual  Related Interventions Assist the client in analyzing his/her worries by examining potential biases such as the probability of the negative expectation  occurring, the real consequences of it occurring, his/her ability to control the outcome, the worst possible outcome, and his/her ability to accept it (see "Analyze the Probability of a Feared Event" in the Adult Psychotherapy Homework Planner by Bryn Gulling; Cognitive Therapy of Anxiety Disorders by Alison Stalling). Objective Identify, challenge, and replace biased, fearful self-talk with positive, realistic, and empowering self-talk. Target Date: 2022-10-14 Frequency: Weekly  Progress: 25 Modality: individual  Related Interventions Explore the client's schema and self-talk that mediate his/her fear response; assist him/her in challenging the biases; replace the distorted messages with reality-based alternatives and positive, realistic self-talk that will increase his/her self-confidence in coping with irrational fears (see Cognitive Therapy of Anxiety Disorders by Alison Stalling). Assign the client a homework exercise in which he/she identifies fearful self-talk, identifies biases in the self-talk, generates alternatives, and tests through behavioral experiments (or assign "Negative Thoughts Trigger Negative Feelings" in the Adult Psychotherapy Homework Planner by [facility]); review and reinforce success, providing corrective feedback toward improvement. Objective Learn and implement personal and interpersonal skills to reduce anxiety and improve interpersonal relationships. Target Date: 2022-10-14 Frequency: Weekly  Progress: 50 Modality: individual  Related Interventions Use instruction, modeling, and role-playing to build the client's general social, communication, and/or conflict resolution skills. Assign the client a homework exercise in which he/she implements communication skills training into his/her daily life (or assign "Restoring Socialization Comfort" in the Adult Psychotherapy Homework Planner by [facility]); review, reinforce success, and provide corrective feedback toward improvement. 7.  Resolve the core conflict that is the source of anxiety. 8. Stabilize anxiety level while increasing ability to function on a daily basis.  Diagnosis:Generalized anxiety disorder  Plan:  -next appointment will be Tuesday, March 27, 2022 at 1pm.

## 2022-03-27 ENCOUNTER — Ambulatory Visit: Payer: Federal, State, Local not specified - PPO | Admitting: Professional

## 2022-03-27 ENCOUNTER — Encounter: Payer: Self-pay | Admitting: Professional

## 2022-03-27 DIAGNOSIS — F411 Generalized anxiety disorder: Secondary | ICD-10-CM

## 2022-03-27 NOTE — Progress Notes (Signed)
[facility] Counselor/Therapist Progress Note  Patient ID: Kristen Nichols, MRN: [id number],    Date: 03/27/2022  Time Spent: 34 minutes [id number]  Treatment Type: Individual Therapy  Risk Assessment: Danger to Self:  No Self-injurious Behavior: No Danger to Others: No  Subjective: This session was held via video teletherapy. The patient consented to video teletherapy and was located in her office during this session. She is aware it is the responsibility of the patient to secure confidentiality on her end of the session. The provider was in a private home office for the duration of this session.    The patient arrived on time for her webex appointment.  Issues addressed: 1-home renovations -pt has returned to her home though they are slowly working on getting things in order -spouse is working at home while she is at work since he is retired Systems developer  -pt took her for her port last week -she had her first round of chemo yesterday   -to slow down the spread, t is not reversible -pt will be staying overnight from Friday-Sunday -pt's father is not feeling great physically and very appreciative of the help b-children Wilfred Lacy and wife Fanny Skates, and daughter Aida Puffer -daughter in law Fanny Skates is great -Aida Puffer is doing great and stopped by for a quick visit 3-mood a-overwhelmed -making small errors she would not normally make 4-coping -pt admits she is coping well despite the number of things that are on her plate both personally and professionally -she is looking forward to spending Friday-Sunday with her father and stepmother Marliss Coots 5-professional -is not attending a conference out of town due to family needs -her employer was completely supportive of her decision to not attend  2023 Treatment Plan Problems Addressed  Anxiety, Family Conflict Goals 1. Achieve a reasonable level of family connectedness and harmony where members support, help, and are  concerned for each other. 2. Decrease the level of present conflict with siblings while beginning to let go of or resolving past conflicts with them. Objective Identify own as well as others' role in the family conflicts. Target Date: 2022-10-14 Frequency: Weekly  Progress: 75 Modality: individual  Related Interventions Confront the client when he/she is not taking responsibility for his/her role in the family conflict and reinforce the client for owning responsibility for his/her contribution to the conflict. Objective Describe the conflicts and the causes of conflicts between self, son, and siblings. Target Date: 2022-10-14 Frequency: Weekly  Progress: 50 Modality: individual  Related Interventions Give verbal permission for the client to have and express own feelings, thoughts, and perspectives in order to foster a sense of autonomy from family. Explore the nature of the client's family conflicts and their perceived causes. Objective Increase the number of positive family interactions by planning activities. Target Date: 2022-10-14 Frequency: Weekly  Progress: 90 Modality: individual  Related Interventions Assist the client in developing a list of positive family activities that promote harmony (e.g., bowling, fishing, playing table games, doing work projects). Schedule such activities into the family calendar. Objective Report an increase in resolving conflicts with siblings by talking calmly and assertively rather than aggressively and defensively. Target Date: 2022-10-14 Frequency: Weekly  Progress: 0 Modality: individual  Related Interventions Use role-playing, role reversal, modeling, and behavioral rehearsal to help the client develop assertive ways to resolve conflict with parents (recommend Your Perfect Right: Assertiveness and Equality in Your Life and Relationships by Neldon Newport). Objective Family members report a desire for and vision of a new sense of  connectedness. Target Date: 2022-10-14 Frequency: Weekly  Progress: 0 Modality: individual  Related Interventions In a family session, assign the family the task of planning and going on an outing or activity; in the following session, process the experience with the family, giving positive reinforcement where appropriate. 3. Enhance ability to effectively cope with the full variety of life's worries and anxieties. 4. Learn and implement coping skills that result in a reduction of anxiety and worry, and improved daily functioning. 5. Reach a level of reduced tension, increased satisfaction, and improved communication with family. 6. Reduce overall frequency, intensity, and duration of the anxiety so that daily functioning is not impaired. Objective Verbalize an understanding of the cognitive, physiological, and behavioral components of anxiety and its treatment. Target Date: 2022-10-14 Frequency: Weekly  Progress: 75 Modality: individual  Related Interventions Discuss how generalized anxiety typically involves excessive worry about unrealistic threats, various bodily expressions of tension, overarousal, and hypervigilance, and avoidance of what is threatening that interact to maintain the problem (see Mastery of Your Anxiety and Worry: Therapist Guide by Corky Mull, and Barlow; Treating Generalized Anxiety Disorder by Rygh and Amparo Bristol). Discuss how treatment targets worry, anxiety symptoms, and avoidance to help the client manage worry effectively, reduce overarousal, and eliminate unnecessary avoidance. Objective Learn and implement problem-solving strategies for realistically addressing worries. Target Date: 2022-10-14 Frequency: Weekly  Progress: 0 Modality: individual  Related Interventions Teach the client problem-solving strategies involving specifically defining a problem, generating options for addressing it, evaluating the pros and cons of each option, selecting and implementing  an optional action, and reevaluating and refining the action (or assign "Applying Problem-Solving to Interpersonal Conflict" in the Adult Psychotherapy Homework Planner by Bryn Gulling). Assign the client a homework exercise in which he/she problem-solves a current problem (see Mastery of Your Anxiety and Worry: Workbook by Adora Fridge and Eliot Ford or Generalized Anxiety Disorder by Eather Colas, and Eliot Ford); review, reinforce success, and provide corrective feedback toward improvement. Objective Describe situations, thoughts, feelings, and actions associated with anxieties and worries, their impact on functioning, and attempts to resolve them. Target Date: 2022-10-14 Frequency: Weekly  Progress: 25 Modality: individual  Related Interventions Ask the client to describe his/her past experiences of anxiety and their impact on functioning; assess the focus, excessiveness, and uncontrollability of the worry and the type, frequency, intensity, and duration of his/her anxiety symptoms (consider using a structured interview such as The Anxiety Disorders Interview Schedule-Adult Version). Objective Learn and implement calming skills to reduce overall anxiety and manage anxiety symptoms. Target Date: 2022-10-14 Frequency: Weekly  Progress: 70 Modality: individual  Related Interventions Teach the client calming/relaxation skills (e.g., applied relaxation, progressive muscle relaxation, cue controlled relaxation; mindful breathing; biofeedback) and how to discriminate better between relaxation and tension; teach the client how to apply these skills to his/her daily life (e.g., New Directions in Progressive Muscle Relaxation by Casper Harrison, and Hazlett-Stevens; Treating Generalized Anxiety Disorder by Rygh and Amparo Bristol). Assign the client homework each session in which he/she practices relaxation exercises daily, gradually applying them progressively from non-anxiety-provoking to anxiety-provoking situations;  review and reinforce success while providing corrective feedback toward improvement. Assign the client to read about progressive muscle relaxation and other calming strategies in relevant books or treatment manuals (e.g., Progressive Relaxation Training by Gwynneth Aliment and Dani Gobble; Mastery of Your Anxiety and Worry: Workbook by Beckie Busing). Objective Learn and implement a strategy to limit the association between various environmental settings and worry, delaying the worry until a designated "worry time." Target Date: 2022-10-14 Frequency: Weekly  Progress: 0 Modality: individual  Related Interventions Explain the rationale for using a worry time as well as how it is to be used; agree upon and implement a worry time with the client. Teach the client how to recognize, stop, and postpone worry to the agreed upon worry time using skills such as thought stopping, relaxation, and redirecting attention (or assign "Making Use of the Thought-Stopping Technique" and/or "Worry Time" in the Adult Psychotherapy Homework Planner by Jongsma to assist skill development); encourage use in daily life; review and reinforce success while providing corrective feedback toward improvement. Objective Verbalize an understanding of the role that cognitive biases play in excessive irrational worry and persistent anxiety symptoms. Target Date: 2022-10-14 Frequency: Weekly  Progress: 80 Modality: individual  Related Interventions Assist the client in analyzing his/her worries by examining potential biases such as the probability of the negative expectation occurring, the real consequences of it occurring, his/her ability to control the outcome, the worst possible outcome, and his/her ability to accept it (see "Analyze the Probability of a Feared Event" in the Adult Psychotherapy Homework Planner by Bryn Gulling; Cognitive Therapy of Anxiety Disorders by Alison Stalling). Objective Identify, challenge, and replace biased, fearful  self-talk with positive, realistic, and empowering self-talk. Target Date: 2022-10-14 Frequency: Weekly  Progress: 25 Modality: individual  Related Interventions Explore the client's schema and self-talk that mediate his/her fear response; assist him/her in challenging the biases; replace the distorted messages with reality-based alternatives and positive, realistic self-talk that will increase his/her self-confidence in coping with irrational fears (see Cognitive Therapy of Anxiety Disorders by Alison Stalling). Assign the client a homework exercise in which he/she identifies fearful self-talk, identifies biases in the self-talk, generates alternatives, and tests through behavioral experiments (or assign "Negative Thoughts Trigger Negative Feelings" in the Adult Psychotherapy Homework Planner by [facility]); review and reinforce success, providing corrective feedback toward improvement. Objective Learn and implement personal and interpersonal skills to reduce anxiety and improve interpersonal relationships. Target Date: 2022-10-14 Frequency: Weekly  Progress: 50 Modality: individual  Related Interventions Use instruction, modeling, and role-playing to build the client's general social, communication, and/or conflict resolution skills. Assign the client a homework exercise in which he/she implements communication skills training into his/her daily life (or assign "Restoring Socialization Comfort" in the Adult Psychotherapy Homework Planner by [facility]); review, reinforce success, and provide corrective feedback toward improvement. 7. Resolve the core conflict that is the source of anxiety. 8. Stabilize anxiety level while increasing ability to function on a daily basis.  Diagnosis:Generalized anxiety disorder  Plan:  -next appointment will be Friday, April 13, 2022 at 10am.

## 2022-03-30 ENCOUNTER — Ambulatory Visit: Payer: Federal, State, Local not specified - PPO | Admitting: Professional

## 2022-04-13 ENCOUNTER — Ambulatory Visit: Payer: Federal, State, Local not specified - PPO | Admitting: Professional

## 2022-04-27 ENCOUNTER — Encounter: Payer: Self-pay | Admitting: Professional

## 2022-04-27 ENCOUNTER — Ambulatory Visit (INDEPENDENT_AMBULATORY_CARE_PROVIDER_SITE_OTHER): Payer: Federal, State, Local not specified - PPO | Admitting: Professional

## 2022-04-27 DIAGNOSIS — F411 Generalized anxiety disorder: Secondary | ICD-10-CM

## 2022-04-27 NOTE — Progress Notes (Signed)
[facility] Counselor/Therapist Progress Note  Patient ID: Kristen Nichols, MRN: [id number],    Date: 04/27/2022  Time Spent: 46 minutes 1002-1048am  Treatment Type: Individual Therapy  Risk Assessment: Danger to Self:  No Self-injurious Behavior: No Danger to Others: No  Subjective: This session was held via video teletherapy. The patient consented to video teletherapy and was located in her office during this session. She is aware it is the responsibility of the patient to secure confidentiality on her end of the session. The provider was in a private home office for the duration of this session.    The patient arrived on time for her Caregility appointment.  Issues addressed: 1-home renovations -pt has returned to her home though they are slowly working on getting things in order -spouse is working at home while she is at work since he is retired Emergency planning/management officer  -passed away quickly since being diagnosed with pancreatic cancer -pt states she could have suffered for a year and she is thankful she did not have to b-she is staying with her father half the week then her youngest sister  -he is very hyper and cannot calm down -he is grieving and having tearful episodes -he is talking more about the pt's mother in the 16 years since she's been gone -her dad is being flooded emotions and wants everything gone as he did with the pt's mother -his emotions are all over the place c-how to help father by listening to him -not making changes without father's express consent -honoring his schedule as much as possible d-in the past he volunteered at [employer] and may return to do this -has not done since 2020 e-he did go out to breakfast with his best friend today -pt's sister [name] prodded him to do so f-father still doing his "chores" and daughters are picking up Belinda's routine 3-her children Genelle Bal and [name] a-since Streeter doesn't want contact she  texted him and he refused for her to sing both him and [name] texts regarding their grandfather -pt was disappointed and admits having cried after the call -her children have been supportive of her need to be with her father b-she has shared Brett's texts with her sisters -pt plans to reinforce the need to keep between themselves and for them not to discuss with him c-pt has talked with daughter about her need to offer her apologies as she is working the steps 4-grief -pt recognizes that she is so much stronger than she was when her mother died 16 years ago -when she comes home on Sundays she feels overwhelmed by the house projects that are undone -on Sundays she comes home and doesn't want to talk to anyone because she is so exhausted -her father is respectful of her Sunday morning barn time and tells her to go -pt felt some peace when Massie Bougie passed as they were having positive moments and everyone was able to talk to her  2023 Treatment Plan Problems Addressed  Anxiety, Family Conflict Goals 1. Achieve a reasonable level of family connectedness and harmony where members support, help, and are concerned for each other. 2. Decrease the level of present conflict with siblings while beginning to let go of or resolving past conflicts with them. Objective Identify own as well as others' role in the family conflicts. Target Date: 2022-10-14 Frequency: Weekly  Progress: 75 Modality: individual  Related Interventions Confront the client when he/she is not taking responsibility for his/her role in the family conflict and reinforce the  client for owning responsibility for his/her contribution to the conflict. Objective Describe the conflicts and the causes of conflicts between self, son, and siblings. Target Date: 2022-10-14 Frequency: Weekly  Progress: 50 Modality: individual  Related Interventions Give verbal permission for the client to have and express own feelings, thoughts, and  perspectives in order to foster a sense of autonomy from family. Explore the nature of the client's family conflicts and their perceived causes. Objective Increase the number of positive family interactions by planning activities. Target Date: 2022-10-14 Frequency: Weekly  Progress: 90 Modality: individual  Related Interventions Assist the client in developing a list of positive family activities that promote harmony (e.g., bowling, fishing, playing table games, doing work projects). Schedule such activities into the family calendar. Objective Report an increase in resolving conflicts with siblings by talking calmly and assertively rather than aggressively and defensively. Target Date: 2022-10-14 Frequency: Weekly  Progress: 0 Modality: individual  Related Interventions Use role-playing, role reversal, modeling, and behavioral rehearsal to help the client develop assertive ways to resolve conflict with parents (recommend Your Perfect Right: Assertiveness and Equality in Your Life and Relationships by Carnella Guadalajara). Objective Family members report a desire for and vision of a new sense of connectedness. Target Date: 2022-10-14 Frequency: Weekly  Progress: 0 Modality: individual  Related Interventions In a family session, assign the family the task of planning and going on an outing or activity; in the following session, process the experience with the family, giving positive reinforcement where appropriate. 3. Enhance ability to effectively cope with the full variety of life's worries and anxieties. 4. Learn and implement coping skills that result in a reduction of anxiety and worry, and improved daily functioning. 5. Reach a level of reduced tension, increased satisfaction, and improved communication with family. 6. Reduce overall frequency, intensity, and duration of the anxiety so that daily functioning is not impaired. Objective Verbalize an understanding of the cognitive,  physiological, and behavioral components of anxiety and its treatment. Target Date: 2022-10-14 Frequency: Weekly  Progress: 75 Modality: individual  Related Interventions Discuss how generalized anxiety typically involves excessive worry about unrealistic threats, various bodily expressions of tension, overarousal, and hypervigilance, and avoidance of what is threatening that interact to maintain the problem (see Mastery of Your Anxiety and Worry: Therapist Guide by Renard Matter, and Barlow; Treating Generalized Anxiety Disorder by Rygh and Ida Rogue). Discuss how treatment targets worry, anxiety symptoms, and avoidance to help the client manage worry effectively, reduce overarousal, and eliminate unnecessary avoidance. Objective Learn and implement problem-solving strategies for realistically addressing worries. Target Date: 2022-10-14 Frequency: Weekly  Progress: 0 Modality: individual  Related Interventions Teach the client problem-solving strategies involving specifically defining a problem, generating options for addressing it, evaluating the pros and cons of each option, selecting and implementing an optional action, and reevaluating and refining the action (or assign "Applying Problem-Solving to Interpersonal Conflict" in the Adult Psychotherapy Homework Planner by Stephannie Li). Assign the client a homework exercise in which he/she problem-solves a current problem (see Mastery of Your Anxiety and Worry: Workbook by Elenora Fender and Filbert Schilder or Generalized Anxiety Disorder by Elesa Hacker, and Filbert Schilder); review, reinforce success, and provide corrective feedback toward improvement. Objective Describe situations, thoughts, feelings, and actions associated with anxieties and worries, their impact on functioning, and attempts to resolve them. Target Date: 2022-10-14 Frequency: Weekly  Progress: 25 Modality: individual  Related Interventions Ask the client to describe his/her past experiences of anxiety  and their impact on functioning; assess the focus, excessiveness, and uncontrollability  of the worry and the type, frequency, intensity, and duration of his/her anxiety symptoms (consider using a structured interview such as The Anxiety Disorders Interview Schedule-Adult Version). Objective Learn and implement calming skills to reduce overall anxiety and manage anxiety symptoms. Target Date: 2022-10-14 Frequency: Weekly  Progress: 70 Modality: individual  Related Interventions Teach the client calming/relaxation skills (e.g., applied relaxation, progressive muscle relaxation, cue controlled relaxation; mindful breathing; biofeedback) and how to discriminate better between relaxation and tension; teach the client how to apply these skills to his/her daily life (e.g., New Directions in Progressive Muscle Relaxation by Marcelyn Ditty, and Hazlett-Stevens; Treating Generalized Anxiety Disorder by Rygh and Ida Rogue). Assign the client homework each session in which he/she practices relaxation exercises daily, gradually applying them progressively from non-anxiety-provoking to anxiety-provoking situations; review and reinforce success while providing corrective feedback toward improvement. Assign the client to read about progressive muscle relaxation and other calming strategies in relevant books or treatment manuals (e.g., Progressive Relaxation Training by Robb Matar and Alen Blew; Mastery of Your Anxiety and Worry: Workbook by Earlie Counts). Objective Learn and implement a strategy to limit the association between various environmental settings and worry, delaying the worry until a designated "worry time." Target Date: 2022-10-14 Frequency: Weekly  Progress: 0 Modality: individual  Related Interventions Explain the rationale for using a worry time as well as how it is to be used; agree upon and implement a worry time with the client. Teach the client how to recognize, stop, and postpone worry to  the agreed upon worry time using skills such as thought stopping, relaxation, and redirecting attention (or assign "Making Use of the Thought-Stopping Technique" and/or "Worry Time" in the Adult Psychotherapy Homework Planner by Jongsma to assist skill development); encourage use in daily life; review and reinforce success while providing corrective feedback toward improvement. Objective Verbalize an understanding of the role that cognitive biases play in excessive irrational worry and persistent anxiety symptoms. Target Date: 2022-10-14 Frequency: Weekly  Progress: 80 Modality: individual  Related Interventions Assist the client in analyzing his/her worries by examining potential biases such as the probability of the negative expectation occurring, the real consequences of it occurring, his/her ability to control the outcome, the worst possible outcome, and his/her ability to accept it (see "Analyze the Probability of a Feared Event" in the Adult Psychotherapy Homework Planner by Stephannie Li; Cognitive Therapy of Anxiety Disorders by Laurence Slate). Objective Identify, challenge, and replace biased, fearful self-talk with positive, realistic, and empowering self-talk. Target Date: 2022-10-14 Frequency: Weekly  Progress: 25 Modality: individual  Related Interventions Explore the client's schema and self-talk that mediate his/her fear response; assist him/her in challenging the biases; replace the distorted messages with reality-based alternatives and positive, realistic self-talk that will increase his/her self-confidence in coping with irrational fears (see Cognitive Therapy of Anxiety Disorders by Laurence Slate). Assign the client a homework exercise in which he/she identifies fearful self-talk, identifies biases in the self-talk, generates alternatives, and tests through behavioral experiments (or assign "Negative Thoughts Trigger Negative Feelings" in the Adult Psychotherapy Homework Planner by  [facility]); review and reinforce success, providing corrective feedback toward improvement. Objective Learn and implement personal and interpersonal skills to reduce anxiety and improve interpersonal relationships. Target Date: 2022-10-14 Frequency: Weekly  Progress: 50 Modality: individual  Related Interventions Use instruction, modeling, and role-playing to build the client's general social, communication, and/or conflict resolution skills. Assign the client a homework exercise in which he/she implements communication skills training into his/her daily life (or assign "Restoring Socialization Comfort"  in the Adult Psychotherapy Homework Planner by [facility]); review, reinforce success, and provide corrective feedback toward improvement. 7. Resolve the core conflict that is the source of anxiety. 8. Stabilize anxiety level while increasing ability to function on a daily basis.  Diagnosis:Generalized anxiety disorder  Plan:  -next appointment will be Thursday, May 10, 2022 at 9am.

## 2022-05-10 ENCOUNTER — Encounter: Payer: Self-pay | Admitting: Professional

## 2022-05-10 ENCOUNTER — Ambulatory Visit (INDEPENDENT_AMBULATORY_CARE_PROVIDER_SITE_OTHER): Payer: Federal, State, Local not specified - PPO | Admitting: Professional

## 2022-05-10 DIAGNOSIS — F411 Generalized anxiety disorder: Secondary | ICD-10-CM

## 2022-05-10 NOTE — Progress Notes (Signed)
[facility] Counselor/Therapist Progress Note  Patient ID: Kristen Nichols, MRN: [id number],    Date: 05/10/2022  Time Spent: 43 minutes 903-946am  Treatment Type: Individual Therapy  Risk Assessment: Danger to Self:  No Self-injurious Behavior: No Danger to Others: No  Subjective: This session was held via video teletherapy. The patient consented to video teletherapy and was located in her office during this session. She is aware it is the responsibility of the patient to secure confidentiality on her end of the session. The provider was in a private home office for the duration of this session.    The patient arrived on time for her Caregility appointment.  Issues addressed: 1-mood a-angry and overwhelmed 2-stressors a-is struggling to cope with stepmother's loss since it has brought up feelings of mother's loss -father ready to get rid of stepmother's belongings and pt is angry b-pt has had overwhelming stressors in past two months that is contributing to her mood and fatigue -pt was out of home for renovation, pt's daughter announced pregnancy, son is moving back to area and is pregnant with second child, pt's BIL was dx with cancer, she reconciled with her sisters, her horse was injured, her MIL was dx and died from cancer, her father is alone, and work has been very busy 3-coping a-what has worked in past b-what does not work c-new techniques to try d-tap into your senses to relax -bird or nature sounds, smells-horse spray  2023 Treatment Plan Problems Addressed  Anxiety, Family Conflict Goals 1. Achieve a reasonable level of family connectedness and harmony where members support, help, and are concerned for each other. 2. Decrease the level of present conflict with siblings while beginning to let go of or resolving past conflicts with them. Objective Identify own as well as others' role in the family conflicts. Target Date: 2022-10-14 Frequency:  biweekly  Progress: 75 Modality: individual  Related Interventions Confront the client when he/she is not taking responsibility for his/her role in the family conflict and reinforce the client for owning responsibility for his/her contribution to the conflict. Objective Describe the conflicts and the causes of conflicts between self, son, and siblings. Target Date: 2022-10-14 Frequency: biweekly  Progress: 50 Modality: individual  Related Interventions Give verbal permission for the client to have and express own feelings, thoughts, and perspectives in order to foster a sense of autonomy from family. Explore the nature of the client's family conflicts and their perceived causes. Objective Increase the number of positive family interactions by planning activities. Target Date: 2022-10-14 Frequency: biweekly  Progress: 90 Modality: individual  Related Interventions Assist the client in developing a list of positive family activities that promote harmony (e.g., bowling, fishing, playing table games, doing work projects). Schedule such activities into the family calendar. Objective Report an increase in resolving conflicts with siblings by talking calmly and assertively rather than aggressively and defensively. Target Date: 2022-10-14 Frequency: biweekly  Progress: 0 Modality: individual  Related Interventions Use role-playing, role reversal, modeling, and behavioral rehearsal to help the client develop assertive ways to resolve conflict with parents (recommend Your Perfect Right: Assertiveness and Equality in Your Life and Relationships by Carnella Guadalajara). Objective Family members report a desire for and vision of a new sense of connectedness. Target Date: 2022-10-14 Frequency: biweekly  Progress: 0 Modality: individual  Related Interventions In a family session, assign the family the task of planning and going on an outing or activity; in the following session, process the experience  with the family, giving  positive reinforcement where appropriate. 3. Enhance ability to effectively cope with the full variety of life's worries and anxieties. 4. Learn and implement coping skills that result in a reduction of anxiety and worry, and improved daily functioning. 5. Reach a level of reduced tension, increased satisfaction, and improved communication with family. 6. Reduce overall frequency, intensity, and duration of the anxiety so that daily functioning is not impaired. Objective Verbalize an understanding of the cognitive, physiological, and behavioral components of anxiety and its treatment. Target Date: 2022-10-14 Frequency: biweekly  Progress: 75 Modality: individual  Related Interventions Discuss how generalized anxiety typically involves excessive worry about unrealistic threats, various bodily expressions of tension, overarousal, and hypervigilance, and avoidance of what is threatening that interact to maintain the problem (see Mastery of Your Anxiety and Worry: Therapist Guide by Renard Matter, and Barlow; Treating Generalized Anxiety Disorder by Rygh and Ida Rogue). Discuss how treatment targets worry, anxiety symptoms, and avoidance to help the client manage worry effectively, reduce overarousal, and eliminate unnecessary avoidance. Objective Learn and implement problem-solving strategies for realistically addressing worries. Target Date: 2022-10-14 Frequency: biweekly  Progress: 0 Modality: individual  Related Interventions Teach the client problem-solving strategies involving specifically defining a problem, generating options for addressing it, evaluating the pros and cons of each option, selecting and implementing an optional action, and reevaluating and refining the action (or assign "Applying Problem-Solving to Interpersonal Conflict" in the Adult Psychotherapy Homework Planner by Stephannie Li). Assign the client a homework exercise in which he/she problem-solves a  current problem (see Mastery of Your Anxiety and Worry: Workbook by Elenora Fender and Filbert Schilder or Generalized Anxiety Disorder by Elesa Hacker, and Filbert Schilder); review, reinforce success, and provide corrective feedback toward improvement. Objective Describe situations, thoughts, feelings, and actions associated with anxieties and worries, their impact on functioning, and attempts to resolve them. Target Date: 2022-10-14 Frequency: biweekly  Progress: 25 Modality: individual  Related Interventions Ask the client to describe his/her past experiences of anxiety and their impact on functioning; assess the focus, excessiveness, and uncontrollability of the worry and the type, frequency, intensity, and duration of his/her anxiety symptoms (consider using a structured interview such as The Anxiety Disorders Interview Schedule-Adult Version). Objective Learn and implement calming skills to reduce overall anxiety and manage anxiety symptoms. Target Date: 2022-10-14 Frequency: biweekly  Progress: 70 Modality: individual  Related Interventions Teach the client calming/relaxation skills (e.g., applied relaxation, progressive muscle relaxation, cue controlled relaxation; mindful breathing; biofeedback) and how to discriminate better between relaxation and tension; teach the client how to apply these skills to his/her daily life (e.g., New Directions in Progressive Muscle Relaxation by Marcelyn Ditty, and Hazlett-Stevens; Treating Generalized Anxiety Disorder by Rygh and Ida Rogue). Assign the client homework each session in which he/she practices relaxation exercises daily, gradually applying them progressively from non-anxiety-provoking to anxiety-provoking situations; review and reinforce success while providing corrective feedback toward improvement. Assign the client to read about progressive muscle relaxation and other calming strategies in relevant books or treatment manuals (e.g., Progressive Relaxation Training  by Robb Matar and Alen Blew; Mastery of Your Anxiety and Worry: Workbook by Earlie Counts). Objective Learn and implement a strategy to limit the association between various environmental settings and worry, delaying the worry until a designated "worry time." Target Date: 2022-10-14 Frequency: biweekly  Progress: 0 Modality: individual  Related Interventions Explain the rationale for using a worry time as well as how it is to be used; agree upon and implement a worry time with the client. Teach the client how to recognize, stop, and  postpone worry to the agreed upon worry time using skills such as thought stopping, relaxation, and redirecting attention (or assign "Making Use of the Thought-Stopping Technique" and/or "Worry Time" in the Adult Psychotherapy Homework Planner by Jongsma to assist skill development); encourage use in daily life; review and reinforce success while providing corrective feedback toward improvement. Objective Verbalize an understanding of the role that cognitive biases play in excessive irrational worry and persistent anxiety symptoms. Target Date: 2022-10-14 Frequency: biweekly  Progress: 80 Modality: individual  Related Interventions Assist the client in analyzing his/her worries by examining potential biases such as the probability of the negative expectation occurring, the real consequences of it occurring, his/her ability to control the outcome, the worst possible outcome, and his/her ability to accept it (see "Analyze the Probability of a Feared Event" in the Adult Psychotherapy Homework Planner by Stephannie Li; Cognitive Therapy of Anxiety Disorders by Laurence Slate). Objective Identify, challenge, and replace biased, fearful self-talk with positive, realistic, and empowering self-talk. Target Date: 2022-10-14 Frequency: biweekly  Progress: 25 Modality: individual  Related Interventions Explore the client's schema and self-talk that mediate his/her fear response;  assist him/her in challenging the biases; replace the distorted messages with reality-based alternatives and positive, realistic self-talk that will increase his/her self-confidence in coping with irrational fears (see Cognitive Therapy of Anxiety Disorders by Laurence Slate). Assign the client a homework exercise in which he/she identifies fearful self-talk, identifies biases in the self-talk, generates alternatives, and tests through behavioral experiments (or assign "Negative Thoughts Trigger Negative Feelings" in the Adult Psychotherapy Homework Planner by [facility]); review and reinforce success, providing corrective feedback toward improvement. Objective Learn and implement personal and interpersonal skills to reduce anxiety and improve interpersonal relationships. Target Date: 2022-10-14 Frequency: biweekly  Progress: 50 Modality: individual  Related Interventions Use instruction, modeling, and role-playing to build the client's general social, communication, and/or conflict resolution skills. Assign the client a homework exercise in which he/she implements communication skills training into his/her daily life (or assign "Restoring Socialization Comfort" in the Adult Psychotherapy Homework Planner by [facility]); review, reinforce success, and provide corrective feedback toward improvement. 7. Resolve the core conflict that is the source of anxiety. 8. Stabilize anxiety level while increasing ability to function on a daily basis.  Diagnosis:Generalized anxiety disorder  Plan:  -next appointment will be Friday, May 25, 2022 at 10am.

## 2022-05-21 ENCOUNTER — Encounter: Payer: Self-pay | Admitting: Family Medicine

## 2022-05-23 ENCOUNTER — Encounter: Payer: Self-pay | Admitting: Family Medicine

## 2022-05-23 ENCOUNTER — Ambulatory Visit: Payer: Federal, State, Local not specified - PPO | Admitting: Family Medicine

## 2022-05-23 VITALS — BP 122/83 | HR 90 | Temp 97.9°F | Ht 62.0 in | Wt 122.5 lb

## 2022-05-23 DIAGNOSIS — J3489 Other specified disorders of nose and nasal sinuses: Secondary | ICD-10-CM

## 2022-05-23 NOTE — Progress Notes (Signed)
Established patient visit   Patient: Kristen Nichols   DOB: January 29, 1955   67 y.o. Female  MRN: [id number] Visit Date: 05/23/2022  Today's healthcare provider: Charlton Amor, DO   Chief Complaint  Patient presents with   Nose Problem    SUBJECTIVE    Chief Complaint  Patient presents with   Nose Problem   HPI  Patient presents today with concerns about her nose.  She took a fall a few days ago when she was walking out of the store and hit the side of her nose.  She does admit to pain but says it has improved greatly.  She also notes some swelling with improvement.  She has a history of a deviated septum that was surgically fixed.  She says after the fall she feels like her nose is not as clear and feels more stuffy.  Review of Systems  Constitutional:  Negative for activity change, fatigue and fever.  HENT:         Nose pain  Respiratory:  Negative for cough and shortness of breath.   Cardiovascular:  Negative for chest pain.  Gastrointestinal:  Negative for abdominal pain.  Genitourinary:  Negative for difficulty urinating.       Current Meds  Medication Sig   buPROPion (WELLBUTRIN XL) 150 MG 24 hr tablet Take 1 tablet (150 mg total) by mouth daily.   fexofenadine-pseudoephedrine (ALLEGRA-D) 60-120 MG 12 hr tablet Take 1 tablet by mouth daily.   FLUoxetine (PROZAC) 20 MG tablet TAKE 1 TABLET BY MOUTH EVERY DAY   hydrochlorothiazide (HYDRODIURIL) 12.5 MG tablet TAKE 1 TABLET BY MOUTH EVERY DAY   meloxicam (MOBIC) 15 MG tablet Take 1 tablet (15 mg total) by mouth daily.   polyethylene glycol (MIRALAX / GLYCOLAX) 17 g packet Take 17 g by mouth daily.   Propylene Glycol (SYSTANE BALANCE) 0.6 % SOLN Place 1 drop into both eyes 2 (two) times daily as needed (dry/irritated eyes).   simethicone (MYLICON) 80 MG chewable tablet Chew 1 tablet (80 mg total) by mouth every 6 (six) hours as needed for flatulence (gas, bloating, abdominal discomfort).   sucralfate (CARAFATE) 1  GM/10ML suspension Take 10 mLs (1 g total) by mouth 4 (four) times daily.   [DISCONTINUED] fluticasone (FLONASE) 50 MCG/ACT nasal spray SPRAY 2 SPRAYS INTO EACH NOSTRIL EVERY DAY. NO REFILLS. NEEDS TO TRANSFER CARE TO NEW PCP. (Patient taking differently: Place 2 sprays into both nostrils 2 (two) times daily as needed for allergies.)    OBJECTIVE    BP 122/83   Pulse 90   Temp 97.9 F (36.6 C) (Oral)   Ht 5\' 2"  (1.575 m)   Wt 122 lb 8 oz (55.6 kg)   SpO2 100%   BMI 22.41 kg/m   Physical Exam Vitals and nursing note reviewed.  Constitutional:      General: She is not in acute distress.    Appearance: Normal appearance.  HENT:     Head: Normocephalic and atraumatic.     Right Ear: External ear normal.     Left Ear: External ear normal.     Nose:     Comments: Nares looks crooked Eyes:     Conjunctiva/sclera: Conjunctivae normal.  Cardiovascular:     Rate and Rhythm: Normal rate and regular rhythm.  Pulmonary:     Effort: Pulmonary effort is normal.     Breath sounds: Normal breath sounds.  Neurological:     General: No focal deficit present.  Mental Status: She is alert and oriented to person, place, and time.  Psychiatric:        Mood and Affect: Mood normal.        Behavior: Behavior normal.        Thought Content: Thought content normal.        Judgment: Judgment normal.       ASSESSMENT & PLAN    Problem List Items Addressed This Visit       Other   Nasal pain - Primary    - nares looks slightly crooked. Will go ahead and refer to ENT for further eval and investigation if septum is deviated. Swelling does seem improved from what patient described      Relevant Orders   Ambulatory referral to ENT    Return if symptoms worsen or fail to improve.      No orders of the defined types were placed in this encounter.   Orders Placed This Encounter  Procedures   Ambulatory referral to ENT    Referral Priority:   Routine    Referral Type:   Consultation     Referral Reason:   Specialty Services Required    Requested Specialty:   Otolaryngology    Number of Visits Requested:   1     Charlton Amor, DO  [facility] at [facility] [phone] (phone) [phone] (fax)  [facility]

## 2022-05-23 NOTE — Assessment & Plan Note (Signed)
-   nares looks slightly crooked. Will go ahead and refer to ENT for further eval and investigation if septum is deviated. Swelling does seem improved from what patient described

## 2022-05-25 ENCOUNTER — Ambulatory Visit: Payer: Federal, State, Local not specified - PPO | Admitting: Professional

## 2022-05-25 DIAGNOSIS — J310 Chronic rhinitis: Secondary | ICD-10-CM | POA: Diagnosis not present

## 2022-05-25 DIAGNOSIS — J343 Hypertrophy of nasal turbinates: Secondary | ICD-10-CM | POA: Diagnosis not present

## 2022-05-25 DIAGNOSIS — S022XXA Fracture of nasal bones, initial encounter for closed fracture: Secondary | ICD-10-CM | POA: Diagnosis not present

## 2022-06-07 ENCOUNTER — Ambulatory Visit (INDEPENDENT_AMBULATORY_CARE_PROVIDER_SITE_OTHER): Payer: Federal, State, Local not specified - PPO | Admitting: Professional

## 2022-06-07 ENCOUNTER — Encounter: Payer: Self-pay | Admitting: Professional

## 2022-06-07 DIAGNOSIS — F411 Generalized anxiety disorder: Secondary | ICD-10-CM | POA: Diagnosis not present

## 2022-06-07 NOTE — Progress Notes (Signed)
[facility] Counselor/Therapist Progress Note  Patient ID: Kristen Nichols, MRN: [id number],    Date: 06/07/2022  Time Spent: 39 minutes 859-938am  Treatment Type: Individual Therapy  Risk Assessment: Danger to Self:  No Self-injurious Behavior: No Danger to Others: No  Subjective: This session was held via video teletherapy. The patient consented to video teletherapy and was located in her office during this session. She is aware it is the responsibility of the patient to secure confidentiality on her end of the session. The provider was in a private home office for the duration of this session.    The patient arrived on time for her Caregility appointment.  Issues addressed: 1-house renovations -have finished the second phase of the renovations -she had a meltdown on Sunday when she entered and saw what a "dustbowl" it was  -she felt like crying but moved past that feeling and was -[place] it [place] -so much has happened since last year -her little puppy Kristen Nichols has brain cancer and is being treated but is uncurable 2-family a-pt's father -she and her sister Kristen Nichols went through stepmother's clothing and her father allowed them to get rid of -pt is no longer angry with her father and her father is no longer angry with the situation -she and her sister are no longer spending the night and he doesn't want them there -he has women from the church showing up at his house all the time -her stepmother's body is back and she is picking out the urn for burial -service date undetermined b-brother in law Kristen Nichols -has been to a rehab in [facility] -he has lost his ability to move and the next thing will be his ability to communicate -they have a home there and buy a one story c-children -is going to have two grandchildren this year -son has moved back to the area and has been easily accessible 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 Achieve a reasonable level of family connectedness and harmony where members support, help, and are concerned for each other. 2. Decrease the level of present conflict with siblings while beginning to let go of or resolving past conflicts with them. Objective Identify own as well as others' role in the family conflicts. Target Date: 2022-10-14 Frequency: biweekly  Progress: 75 Modality: individual  Related Interventions Confront the client when he/she is not taking responsibility for his/her role in the family conflict and reinforce the client for owning responsibility for his/her contribution to the conflict. Objective Describe the conflicts and the causes of conflicts between self, son, and siblings. Target Date: 2022-10-14 Frequency: biweekly  Progress: 50 Modality: individual  Related Interventions Give verbal permission for the client to have and express own feelings, thoughts, and perspectives in order to foster a sense of autonomy from family. Explore the nature of the client's family conflicts and their perceived causes. Objective Increase the number of positive family interactions by planning activities. Target Date: 2022-10-14 Frequency: biweekly  Progress: 90 Modality: individual  Related Interventions Assist the  client in developing a list of positive family activities that promote harmony (e.g., bowling, fishing, playing table games, doing work projects). Schedule such activities into the family calendar. Objective Report an increase in resolving  conflicts with siblings by talking calmly and assertively rather than aggressively and defensively. Target Date: 2022-10-14 Frequency: biweekly  Progress: 0 Modality: individual  Related Interventions Use role-playing, role reversal, modeling, and behavioral rehearsal to help the client develop assertive ways to resolve conflict with parents (recommend Your Perfect Right: Assertiveness and Equality in Your Life and Relationships by Kristen Nichols). Objective Family members report a desire for and vision of a new sense of connectedness. Target Date: 2022-10-14 Frequency: biweekly  Progress: 0 Modality: individual  Related Interventions In a family session, assign the family the task of planning and going on an outing or activity; in the following session, process the experience with the family, giving positive reinforcement where appropriate. 3. Enhance ability to effectively cope with the full variety of life's worries and anxieties. 4. Learn and implement coping skills that result in a reduction of anxiety and worry, and improved daily functioning. 5. Reach a level of reduced tension, increased satisfaction, and improved communication with family. 6. Reduce overall frequency, intensity, and duration of the anxiety so that daily functioning is not impaired. Objective Verbalize an understanding of the cognitive, physiological, and behavioral components of anxiety and its treatment. Target Date: 2022-10-14 Frequency: biweekly  Progress: 75 Modality: individual  Related Interventions Discuss how generalized anxiety typically involves excessive worry about unrealistic threats, various bodily expressions of tension, overarousal, and hypervigilance, and avoidance of what is threatening that interact to maintain the problem (see Mastery of Your Anxiety and Worry: Therapist Guide by Kristen Nichols, and Kristen Nichols; Treating Generalized Anxiety Disorder by Kristen Nichols and Kristen Nichols). Discuss how treatment  targets worry, anxiety symptoms, and avoidance to help the client manage worry effectively, reduce overarousal, and eliminate unnecessary avoidance. Objective Learn and implement problem-solving strategies for realistically addressing worries. Target Date: 2022-10-14 Frequency: biweekly  Progress: 0 Modality: individual  Related Interventions Teach the client problem-solving strategies involving specifically defining a problem, generating options for addressing it, evaluating the pros and cons of each option, selecting and implementing an optional action, and reevaluating and refining the action (or assign "Applying Problem-Solving to Interpersonal Conflict" in the Adult Psychotherapy Homework Planner by Stephannie Li). Assign the client a homework exercise in which he/she problem-solves a current problem (see Mastery of Your Anxiety and Worry: Workbook by Elenora Fender and Filbert Schilder or Generalized Anxiety Disorder by Elesa Hacker, and Filbert Schilder); review, reinforce success, and provide corrective feedback toward improvement. Objective Describe situations, thoughts, feelings, and actions associated with anxieties and worries, their impact on functioning, and attempts to resolve them. Target Date: 2022-10-14 Frequency: biweekly  Progress: 25 Modality: individual  Related Interventions Ask the client to describe his/her past experiences of anxiety and their impact on functioning; assess the focus, excessiveness, and uncontrollability of the worry and the type, frequency, intensity, and duration of his/her anxiety symptoms (consider using a structured interview such as The Anxiety Disorders Interview Schedule-Adult Version). Objective Learn and implement calming skills to reduce overall anxiety and manage anxiety symptoms. Target Date: 2022-10-14 Frequency: biweekly  Progress: 70 Modality: individual  Related Interventions Teach the client calming/relaxation skills (e.g., applied relaxation, progressive muscle  relaxation, cue controlled relaxation; mindful breathing; biofeedback) and how to discriminate better between relaxation and tension; teach the client how to apply these skills to his/her daily life (e.g., New Directions in Progressive Muscle Relaxation by  Marcelyn Ditty, and Hazlett-Stevens; Treating Generalized Anxiety Disorder by Kristen Nichols and Kristen Nichols). Assign the client homework each session in which he/she practices relaxation exercises daily, gradually applying them progressively from non-anxiety-provoking to anxiety-provoking situations; review and reinforce success while providing corrective feedback toward improvement. Assign the client to read about progressive muscle relaxation and other calming strategies in relevant books or treatment manuals (e.g., Progressive Relaxation Training by Robb Matar and Alen Blew; Mastery of Your Anxiety and Worry: Workbook by Earlie Counts). Objective Learn and implement a strategy to limit the association between various environmental settings and worry, delaying the worry until a designated "worry time." Target Date: 2022-10-14 Frequency: biweekly  Progress: 0 Modality: individual  Related Interventions Explain the rationale for using a worry time as well as how it is to be used; agree upon and implement a worry time with the client. Teach the client how to recognize, stop, and postpone worry to the agreed upon worry time using skills such as thought stopping, relaxation, and redirecting attention (or assign "Making Use of the Thought-Stopping Technique" and/or "Worry Time" in the Adult Psychotherapy Homework Planner by Jongsma to assist skill development); encourage use in daily life; review and reinforce success while providing corrective feedback toward improvement. Objective Verbalize an understanding of the role that cognitive biases play in excessive irrational worry and persistent anxiety symptoms. Target Date: 2022-10-14 Frequency: biweekly   Progress: 80 Modality: individual  Related Interventions Assist the client in analyzing his/her worries by examining potential biases such as the probability of the negative expectation occurring, the real consequences of it occurring, his/her ability to control the outcome, the worst possible outcome, and his/her ability to accept it (see "Analyze the Probability of a Feared Event" in the Adult Psychotherapy Homework Planner by Stephannie Li; Cognitive Therapy of Anxiety Disorders by Laurence Slate). Objective Identify, challenge, and replace biased, fearful self-talk with positive, realistic, and empowering self-talk. Target Date: 2022-10-14 Frequency: biweekly  Progress: 25 Modality: individual  Related Interventions Explore the client's schema and self-talk that mediate his/her fear response; assist him/her in challenging the biases; replace the distorted messages with reality-based alternatives and positive, realistic self-talk that will increase his/her self-confidence in coping with irrational fears (see Cognitive Therapy of Anxiety Disorders by Laurence Slate). Assign the client a homework exercise in which he/she identifies fearful self-talk, identifies biases in the self-talk, generates alternatives, and tests through behavioral experiments (or assign "Negative Thoughts Trigger Negative Feelings" in the Adult Psychotherapy Homework Planner by [facility]); review and reinforce success, providing corrective feedback toward improvement. Objective Learn and implement personal and interpersonal skills to reduce anxiety and improve interpersonal relationships. Target Date: 2022-10-14 Frequency: biweekly  Progress: 50 Modality: individual  Related Interventions Use instruction, modeling, and role-playing to build the client's general social, communication, and/or conflict resolution skills. Assign the client a homework exercise in which he/she implements communication skills training into his/her daily  life (or assign "Restoring Socialization Comfort" in the Adult Psychotherapy Homework Planner by [facility]); review, reinforce success, and provide corrective feedback toward improvement. 7. Resolve the core conflict that is the source of anxiety. 8. Stabilize anxiety level while increasing ability to function on a daily basis.  Diagnosis:Generalized anxiety disorder  Plan:  -plan for retirement-I don't love my job anymore -next appointment will be Friday, June 22, 2022 at 10am.

## 2022-06-12 ENCOUNTER — Encounter: Payer: Self-pay | Admitting: Podiatry

## 2022-06-21 ENCOUNTER — Encounter: Payer: Self-pay | Admitting: Podiatry

## 2022-06-21 ENCOUNTER — Ambulatory Visit: Payer: Federal, State, Local not specified - PPO | Admitting: Podiatry

## 2022-06-21 DIAGNOSIS — G5761 Lesion of plantar nerve, right lower limb: Secondary | ICD-10-CM | POA: Diagnosis not present

## 2022-06-21 MED ORDER — DEXAMETHASONE SODIUM PHOSPHATE 120 MG/30ML IJ SOLN
4.0000 mg | Freq: Once | INTRAMUSCULAR | Status: DC
Start: 2022-06-21 — End: 2023-02-21

## 2022-06-21 MED ORDER — TRIAMCINOLONE ACETONIDE 10 MG/ML IJ SUSP
10.0000 mg | Freq: Once | INTRAMUSCULAR | Status: DC
Start: 2022-06-21 — End: 2023-02-21

## 2022-06-21 NOTE — Progress Notes (Signed)
  Subjective:  Patient ID: Kristen Nichols, female    DOB: [birth year],   MRN: [id number]  Chief Complaint  Patient presents with   Foot Pain    Right foot pain is back and patient states pain has now moved to the ball of her foot patient is requesting a injection     67 y.o. female presents for  follow-up of right foot pain. Relates the injections for neuroma helped in the past and requesting another injection today. Denies any other pedal complaints. Denies n/v/f/c.   Past Medical History:  Diagnosis Date   Allergy    Hayfever   Anxiety    Benign essential hypertension 10/13/2012   Last Assessment & Plan:  Well controlled.     Colitis    Diverticulosis    GERD (gastroesophageal reflux disease)    History of colon polyps    Hypertension    IBS (irritable bowel syndrome)    PONV (postoperative nausea and vomiting)    after ankle surgery in 1994   UC (ulcerative colitis) (HCC)     Objective:  Physical Exam: Vascular: DP/PT pulses 2/4 bilateral. CFT <3 seconds. Normal hair growth on digits. No edema.  Skin. No lacerations or abrasions bilateral feet.  Musculoskeletal: MMT 5/5 bilateral lower extremities in DF, PF, Inversion and Eversion. Deceased ROM in DF of ankle joint. Tender to third interspace on the right and pain along the third and fourth MPJs. Positive mulders click and pain with metatarsal squeeze.  Neurological: Sensation intact to light touch.   Assessment:   1. Morton's neuroma of right foot       Plan:  Patient was evaluated and treated and all questions answered. Discussed neuroma and treatment options with patient.  Radiographs reviewed and discussed with patient.  Injection offered today. Patient in agreement. Procedure below.  Discussed padding and offloading today.  Prescription for meloxicam provided.  Discussed if pain does not improve may consider  MRI for further surgical planning.  Patient to return in 6 weeks or sooner if concerns arise.    Procedure: Injection Tendon/Ligament Discussed alternatives, risks, complications and verbal consent was obtained.  Location: Right third interspace. Skin Prep: Alcohol. Injectate: 1cc 0.5% marcaine plain, 1 cc dexamethasone 0.5 cc kenalog  Disposition: Patient tolerated procedure well. Injection site dressed with a band-aid.  Post-injection care was discussed and return precautions discussed.      Louann Sjogren, DPM

## 2022-06-22 ENCOUNTER — Ambulatory Visit: Payer: Federal, State, Local not specified - PPO | Admitting: Professional

## 2022-06-24 ENCOUNTER — Encounter: Payer: Self-pay | Admitting: Podiatry

## 2022-07-02 ENCOUNTER — Other Ambulatory Visit: Payer: Self-pay | Admitting: Family Medicine

## 2022-07-02 DIAGNOSIS — Z1231 Encounter for screening mammogram for malignant neoplasm of breast: Secondary | ICD-10-CM

## 2022-07-04 ENCOUNTER — Ambulatory Visit
Admission: RE | Admit: 2022-07-04 | Discharge: 2022-07-04 | Disposition: A | Discharge status: Home / Self Care | Payer: Federal, State, Local not specified - PPO | Source: Ambulatory Visit | Attending: Family Medicine | Admitting: Family Medicine

## 2022-07-04 DIAGNOSIS — Z1231 Encounter for screening mammogram for malignant neoplasm of breast: Secondary | ICD-10-CM | POA: Diagnosis not present

## 2022-07-06 ENCOUNTER — Ambulatory Visit: Payer: Federal, State, Local not specified - PPO | Admitting: Professional

## 2022-07-06 ENCOUNTER — Ambulatory Visit: Payer: Federal, State, Local not specified - PPO | Admitting: Podiatry

## 2022-08-07 ENCOUNTER — Other Ambulatory Visit: Payer: Self-pay | Admitting: Family Medicine

## 2022-08-07 DIAGNOSIS — I10 Essential (primary) hypertension: Secondary | ICD-10-CM

## 2022-09-20 ENCOUNTER — Encounter: Payer: Self-pay | Admitting: Family Medicine

## 2022-09-21 ENCOUNTER — Telehealth (INDEPENDENT_AMBULATORY_CARE_PROVIDER_SITE_OTHER): Payer: Federal, State, Local not specified - PPO | Admitting: Family Medicine

## 2022-09-21 ENCOUNTER — Encounter: Payer: Self-pay | Admitting: Family Medicine

## 2022-09-21 VITALS — Temp 99.6°F | Ht 62.0 in

## 2022-09-21 DIAGNOSIS — U071 COVID-19: Secondary | ICD-10-CM | POA: Diagnosis not present

## 2022-09-21 MED ORDER — HYDROCOD POLI-CHLORPHE POLI ER 10-8 MG/5ML PO SUER
5.0000 mL | Freq: Two times a day (BID) | ORAL | 0 refills | Status: AC | PRN
Start: 2022-09-21 — End: ?

## 2022-09-21 MED ORDER — NIRMATRELVIR/RITONAVIR (PAXLOVID)TABLET
3.0000 | ORAL_TABLET | Freq: Two times a day (BID) | ORAL | 0 refills | Status: AC
Start: 2022-09-21 — End: 2022-09-26

## 2022-09-21 NOTE — Progress Notes (Signed)
Acute Office Visit  Subjective:     Patient ID: Kristen Nichols, female    DOB: Sep 04, 1955, 67 y.o.   MRN: [id number]  Chief Complaint  Patient presents with   Covid Positive    Pt tested positive yesterday morning with an at home test. She has had fever body aches, headaches, and sore throat x2days    I connected with  [name] on 09/21/22 by a video and audio enabled telemedicine application and verified that I am speaking with the correct person using two identifiers.  Patient Location: Home  Provider Location: Office/Clinic  I discussed the limitations of evaluation and management by telemedicine. The patient expressed understanding and agreed to proceed.  HPI Patient on telehealth for covid positive. Not having any trouble breathing  Review of Systems  Constitutional:  Negative for chills and fever.  Respiratory:  Negative for cough and shortness of breath.   Cardiovascular:  Negative for chest pain.  Neurological:  Negative for headaches.        Objective:    Temp 99.6 F (37.6 C) (Oral)   Ht 5\' 2"  (1.575 m)   BMI 22.41 kg/m    Physical Exam Vitals reviewed.  Constitutional:      Appearance: She is well-developed.  HENT:     Head: Normocephalic and atraumatic.  Eyes:     Conjunctiva/sclera: Conjunctivae normal.  Cardiovascular:     Rate and Rhythm: Normal rate.  Pulmonary:     Effort: Pulmonary effort is normal.  Skin:    General: Skin is dry.     Coloration: Skin is not pale.  Neurological:     Mental Status: She is alert and oriented to person, place, and time.  Psychiatric:        Behavior: Behavior normal.     No results found for any visits on 09/21/22.      Assessment & Plan:   Problem List Items Addressed This Visit       Other   COVID-19 - Primary    - given pt tussionex and paxlovid for treatment - use tylenol and advil prn for body aches and fevers - gave rtc precautions      Relevant Medications    chlorpheniramine-HYDROcodone (TUSSIONEX) 10-8 MG/5ML   nirmatrelvir/ritonavir (PAXLOVID) 20 x 150 MG & 10 x 100MG  TABS    Meds ordered this encounter  Medications   chlorpheniramine-HYDROcodone (TUSSIONEX) 10-8 MG/5ML    Sig: Take 5 mLs by mouth every 12 (twelve) hours as needed for cough (cough, will cause drowsiness.).    Dispense:  120 mL    Refill:  0   nirmatrelvir/ritonavir (PAXLOVID) 20 x 150 MG & 10 x 100MG  TABS    Sig: Take 3 tablets by mouth 2 (two) times daily for 5 days. (Take nirmatrelvir 150 mg two tablets twice daily for 5 days and ritonavir 100 mg one tablet twice daily for 5 days) Patient GFR is >60    Dispense:  30 tablet    Refill:  0    No follow-ups on file.  Charlton Amor, DO

## 2022-09-21 NOTE — Assessment & Plan Note (Signed)
-   given pt tussionex and paxlovid for treatment - use tylenol and advil prn for body aches and fevers - gave rtc precautions

## 2022-10-02 ENCOUNTER — Telehealth: Payer: Federal, State, Local not specified - PPO | Admitting: Family Medicine

## 2022-10-02 ENCOUNTER — Encounter: Payer: Self-pay | Admitting: Family Medicine

## 2022-10-02 VITALS — Temp 99.9°F | Ht 62.0 in | Wt 122.5 lb

## 2022-10-02 DIAGNOSIS — U071 COVID-19: Secondary | ICD-10-CM

## 2022-10-02 DIAGNOSIS — R350 Frequency of micturition: Secondary | ICD-10-CM | POA: Insufficient documentation

## 2022-10-02 NOTE — Assessment & Plan Note (Signed)
-   pt also saying she is having one day of urinary frequency. I wonder if she has a uti. Have asked her to stop by clinic for urine sample and she will come tomorrow

## 2022-10-02 NOTE — Assessment & Plan Note (Signed)
Telehealth visit-pt test positive for covid last week and then took a test and was negative. Symptoms came back yesterday and she was positive again. Discussed possibility of rebound covid after paxlovid which sounds like what she has - recommended supportive care - daughter is having her baby at the end of the week, recommended contacting obgyn for recs. I did discuss cdc recs with her

## 2022-10-02 NOTE — Progress Notes (Signed)
Established patient visit   Patient: Kristen Nichols   DOB: [birth year]   67 y.o. Female  MRN: [id number] Visit Date: 10/02/2022  Today's healthcare provider: Charlton Amor, DO   Chief Complaint  Patient presents with   Covid Positive    Pt tested negative for covid last tues  09/25/22 after taking her plaxlovid, started having symptoms again and tested positive yesterday evening.    SUBJECTIVE    Chief Complaint  Patient presents with   Covid Positive    Pt tested negative for covid last tues  09/25/22 after taking her plaxlovid, started having symptoms again and tested positive yesterday evening.   HPI HPI     Covid Positive    Additional comments: Pt tested negative for covid last tues  09/25/22 after taking her plaxlovid, started having symptoms again and tested positive yesterday evening.      Last edited by Roselyn Reef, CMA on 10/02/2022  8:13 AM.      I connected with  Kristen Nichols on 10/02/22 by a video and audio enabled telemedicine application and verified that I am speaking with the correct person using two identifiers.  Patient Location: Home  Provider Location: Office/Clinic  I discussed the limitations of evaluation and management by telemedicine. The patient expressed understanding and agreed to proceed.  Tested positive for covid again post paxlovid use. Has congestion and slight cough.  Review of Systems  Constitutional:  Negative for activity change, fatigue and fever.  HENT:  Positive for congestion.   Respiratory:  Positive for cough. Negative for shortness of breath.   Cardiovascular:  Negative for chest pain.  Gastrointestinal:  Negative for abdominal pain.  Genitourinary:  Negative for difficulty urinating.       Current Meds  Medication Sig   buPROPion (WELLBUTRIN XL) 150 MG 24 hr tablet Take 1 tablet (150 mg total) by mouth daily.   chlorpheniramine-HYDROcodone (TUSSIONEX) 10-8 MG/5ML Take 5 mLs by mouth every 12 (twelve)  hours as needed for cough (cough, will cause drowsiness.).   fexofenadine-pseudoephedrine (ALLEGRA-D) 60-120 MG 12 hr tablet Take 1 tablet by mouth daily.   FLUoxetine (PROZAC) 20 MG tablet TAKE 1 TABLET BY MOUTH EVERY DAY   hydrochlorothiazide (HYDRODIURIL) 12.5 MG tablet TAKE 1 TABLET BY MOUTH EVERY DAY   meloxicam (MOBIC) 15 MG tablet Take 1 tablet (15 mg total) by mouth daily.   polyethylene glycol (MIRALAX / GLYCOLAX) 17 g packet Take 17 g by mouth daily.   Propylene Glycol (SYSTANE BALANCE) 0.6 % SOLN Place 1 drop into both eyes 2 (two) times daily as needed (dry/irritated eyes).   simethicone (MYLICON) 80 MG chewable tablet Chew 1 tablet (80 mg total) by mouth every 6 (six) hours as needed for flatulence (gas, bloating, abdominal discomfort).   sucralfate (CARAFATE) 1 GM/10ML suspension Take 10 mLs (1 g total) by mouth 4 (four) times daily.   Current Facility-Administered Medications for the 10/02/22 encounter (Video Visit) with Charlton Amor, DO  Medication   dexamethasone (DECADRON) injection 4 mg   triamcinolone acetonide (KENALOG) 10 MG/ML injection 10 mg    OBJECTIVE    Temp 99.9 F (37.7 C) (Oral)   Ht 5\' 2"  (1.575 m)   Wt 122 lb 8 oz (55.6 kg)   BMI 22.41 kg/m   Physical Exam Vitals reviewed.  Constitutional:      Appearance: She is well-developed.  HENT:     Head: Normocephalic and atraumatic.  Eyes:     Conjunctiva/sclera: Conjunctivae  normal.  Cardiovascular:     Rate and Rhythm: Normal rate.  Pulmonary:     Effort: Pulmonary effort is normal.  Skin:    General: Skin is dry.     Coloration: Skin is not pale.  Neurological:     Mental Status: She is alert and oriented to person, place, and time.  Psychiatric:        Behavior: Behavior normal.        ASSESSMENT & PLAN    Problem List Items Addressed This Visit       Other   COVID-19 - Primary    Telehealth visit-pt test positive for covid last week and then took a test and was negative. Symptoms  came back yesterday and she was positive again. Discussed possibility of rebound covid after paxlovid which sounds like what she has - recommended supportive care - daughter is having her baby at the end of the week, recommended contacting obgyn for recs. I did discuss cdc recs with her       Urinary frequency    - pt also saying she is having one day of urinary frequency. I wonder if she has a uti. Have asked her to stop by clinic for urine sample and she will come tomorrow      Pt is UTD on dtap for baby No follow-ups on file.      No orders of the defined types were placed in this encounter.   No orders of the defined types were placed in this encounter.    Charlton Amor, DO  [facility] at [facility] [phone] (phone) [phone] (fax)  [facility]

## 2022-11-07 ENCOUNTER — Other Ambulatory Visit: Payer: Self-pay | Admitting: Family Medicine

## 2022-11-07 DIAGNOSIS — F419 Anxiety disorder, unspecified: Secondary | ICD-10-CM

## 2022-11-12 ENCOUNTER — Ambulatory Visit: Payer: Federal, State, Local not specified - PPO | Admitting: Professional

## 2022-11-12 ENCOUNTER — Encounter: Payer: Self-pay | Admitting: Professional

## 2022-11-12 DIAGNOSIS — F339 Major depressive disorder, recurrent, unspecified: Secondary | ICD-10-CM | POA: Diagnosis not present

## 2022-11-12 DIAGNOSIS — F411 Generalized anxiety disorder: Secondary | ICD-10-CM

## 2022-11-12 NOTE — Progress Notes (Addendum)
[facility] Counselor/Therapist Progress Note  Patient ID: Kristen Nichols, MRN: [id number],    Date: 11/12/2022  Time Spent: 56 minutes [id number]  Treatment Type: Individual Therapy  Risk Assessment: Danger to Self:  No Self-injurious Behavior: No Danger to Others: No  Subjective: This session was held via video teletherapy. The patient consented to video teletherapy and was located in her office during this session. She is aware it is the responsibility of the patient to secure confidentiality on her end of the session. The provider was in a private home office for the duration of this session.    The patient arrived on time for her Caregility appointment.  Issues addressed: 1-homework -plan for retirement-I don't love my job anymore 2-family a-pt's father -her father is doing well and she is seeing but not as much -he is going to move in with the pt and her spouse within the next year b-sisters -going very well -connected c-Brett and [name] -good and just had new baby d-[name] -was pregnant and due late September -not a lot of contact -she was to be induced and [name] said she was to be induced -she called her mom and told her she was never pregnant -she and her brother had started talking and she was reconnected with whole family   -she did this nine years ago   -pt did not see and she missed every sign   -daughter [name] said she was going to appts w [name] and she told [name] her mom was going   -[name] called her church and they came over and organized for her to go to an inpt facility in [place], [place] -[name] has returned to his job and is going 28 days at a time -[name] is done and is supportive of her but will not be in an intimate relationship -pt and spouse got Covid for first time -both were very sick  b-brother in law [name] -has been to a rehab in [facility] bought a million dollar house who renovated to handicapped ready and comes  with handicapped accessible Zenaida Niece   2024 Treatment Plan Problems: Anxiety, Unipolar Depression, Phase Of Life Problems, Grief / Loss Unresolved  Symptoms: Hypervigilance (e.g., feeling constantly on edge, experiencing concentration difficulties, having trouble falling or staying asleep, exhibiting a general state of irritability). Excessive and/or unrealistic worry that is difficult to control occurring more days than not for at least 6 months about a number of events or activities. Thoughts dominated by loss coupled with poor concentration, tearful spells, and confusion about the future. Serial losses in life (i.e., deaths, divorces, jobs) that led to depression and discouragement. Frustration and anxiety related to providing oversight and caretaking to an aging, ailing, and dependent parent. Restlessness and feelings of lost identity and meaning due to retirement. Depressed or irritable mood. Poor concentration and indecisiveness. Unresolved grief issues. History of chronic or recurrent depression for which the client has taken antidepressant medication, been hospitalized, had outpatient treatment, or had a course of electroconvulsive therapy.  Goals: Resolve the core conflict that is the source of anxiety. Appropriately grieve the loss in order to normalize mood and to return to previously adaptive level of functioning. Develop healthy thinking patterns and beliefs about self, others, and the world that lead to the alleviation and help prevent the relapse of depression. Reorient life view to recognize the advantages of the current situation. Balance life activities between consideration of others and development of own interests. Resolve the loss, reengaging in  old relationships and initiating new contacts with others. Begin a healthy grieving process around the loss. Learn and implement coping skills that result in a reduction of anxiety and worry, and improved daily  functioning.  Objectives target date for all objectives is 11/12/2023: Learn and implement calming skills to reduce overall anxiety and manage anxiety symptoms. Identify and engage in pleasant activities on a daily basis. Learn to accept limitations in life and commit to tolerating, rather than avoiding, unpleasant emotions while accomplishing meaningful goals. Report decreased time spent each day focusing on the loss. Express thoughts and feelings about the deceased that went unexpressed while the deceased was alive. Identify what stages of grief have been experienced in the continuum of the grieving process. Begin verbalizing feelings associated with the loss. Read books on the topic of grief to better understand the loss experience and to increase a sense of hope. Identify values that guide life's decisions and determine fulfillment. Implement increased assertiveness to take control of conflicts. Increase communication with significant others regarding current life stress factors. Increase activities that reinforce a positive self-identity. Read self-help book on the difficult transition life is presenting currently. Increase social contacts to reduce sense of isolation. Implement mindfulness techniques for relapse prevention. Verbalize an understanding and resolution of current interpersonal problems. Learn and implement conflict resolution skills to resolve interpersonal problems. Increasingly verbalize hopeful and positive statements regarding self, others, and the future.  Interventions: Conduct an empty-chair exercise with the client where he/she focuses on expressing to the lost loved one imagined in the chair what he/she never said while that loved one was alive. Ask the client to write a letter to the lost person describing his/her fond memories and/or painful and regretful memories, and how he/she currently feels life (or assign "Dear _____: A Letter to a Lost Loved One" in the  Adult Psychotherapy Homework Planner by [facility]); process the letter in session. Develop a grieving ritual with an identified feeling state (e.g., dress in dark colors, preferably black, to indicate deep sorrow) which the client may focus on near the anniversary of the loss. Process what he/she received from the ritual. Ask the client to read books on grief and loss (e.g., Getting to the Other Side of Grief: Overcoming the Loss of a Spouse by Zonnebelt-Smeenge and Joana Reamer; Good Grief by Alphia Kava; When Bad Things Happen to Good People by Pryor Montes; How Can It Be All Right When Everything Is All Wrong? by [facility]); process the content. Assist the client in identifying the stages of grief that he/she has experienced and which stage he/she is presently working through. Assist the client in identifying and expressing feelings connected with his/her loss. Ask the client to bring pictures or mementos connected with his/her loss to a session and talk about them (or assign "Creating a [place]" in the Adult Psychotherapy Homework Planner by Stephannie Li). Assist the client in clarifying his/her identity and meaning in life by listing his/her strengths, positive traits and talents, potential ways to contribute to society, and areas of interest and ability that have not yet been developed (or assign "What's Good About Me and My Life?" from the Adult Psychotherapy Homework Planner by Stephannie Li). Develop an action plan with the client to increase activities that give meaning and expand his/her sense of identity at a time of transition in life phases (e.g., single to married, employed to homemaker, childless to parent, employed to retired); Public house manager; suggest the client read material on transitioning in life (e.g., Managing Transitions: Making the  Most of Change or Transitions: Making Sense of Life's Changes by Henreitta Leber). Explore opportunities for the client to overcome his/her sense of isolation (e.g., joining  a community recreational or educational group, becoming active in church or synagogue activities, taking formal education classes, enrolling in an exercise group, joining a hobby support group); encourage implementation of these activities. Use role-playing and modeling to teach the client social skills needed to reach out to build new relationships (e.g., starting conversations, introducing self, asking questions of others about themselves, smiling and being friendly, inviting new acquaintances to his/her home, initiating a social engagement or activity with a new acquaintance). Suggest reading material to the client on making the transition that is stressful (e.g., new marriage, new parent, becoming full-time homemaker, providing care to an aging parent, retirement, or adjusting to an "empty nest"); consult the Bibliotherapy Appendix for selected titles. Use role-playing, modeling, and behavior rehearsal to teach the client assertiveness skills that can be applied to reducing conflict or dissatisfaction. Encourage the client to read books on assertiveness and boundary setting (e.g., The Assertiveness Workbook: How to Express Your Ideas and Stand Up for Yourself at Work and in Relationships by Eloise Harman; Asserting Yourself by Lupe Carney; When I Say No, I Feel Guilty by Katrinka Blazing; Your Perfect Right by Carnella Guadalajara); process the content and its application to the client's daily life. Teach the client communication skills (e.g., "I messages," active listening, eye contact) to apply to his/her current life stress factors. Engage the client in behavioral activation, increasing the client's contact with sources of reward, identifying processes that inhibit activation, and teaching skills to solve life problems (or assign "Identify and Schedule Pleasant Activities" in the Adult Psychotherapy Homework Planner by Stephannie Li); use behavioral techniques such as instruction, rehearsal, role-playing, role reversal as  needed to assist adoption into the client's daily life; reinforce success. Use techniques from Acceptance and Commitment Therapy to help client accept uncomfortable realities such as lack of complete control, imperfections, and uncertainty and tolerate unpleasant emotions and thoughts in order to accomplish value-consistent goals. Teach the client more about depression and how to recognize and accept some sadness as a normal variation in feeling. Use mindfulness meditation and cognitive therapy techniques to help the client learn to recognize and regulate the negative thought processes associated with depression and to change his/her relationship with these thoughts (see Mindfulness-Based Cognitive Therapy for Depression by Chevis Pretty, and Shona Simpson). Help the client resolve depression related to interpersonal problems through the use of reassurance and support, clarification of cognitive and affective triggers that ignite conflicts, and active problem-solving (or assign "Applying Problem-Solving to Interpersonal Conflict" in the Adult Psychotherapy Homework Planner by Stephannie Li). For role transitions (e.g., beginning or ending a relationship or career, moving, promotion, retirement, graduation), help the client mourn the loss of the old role while recognizing positive and negative aspects of the new role, and taking steps to gain mastery over the new role. Assign the client to read about progressive muscle relaxation and other calming strategies in relevant books or treatment manuals (e.g., Progressive Relaxation Training by Robb Matar and Alen Blew; Mastery of Your Anxiety and Worry: Workbook by Earlie Counts). Teach the client calming/relaxation skills (e.g., applied relaxation, progressive muscle relaxation, cue-controlled relaxation; mindful breathing; biofeedback) and how to discriminate better between relaxation and tension; teach the client how to apply these skills to his/her daily life (e.g.,  New Directions in Progressive Muscle Relaxation by Marcelyn Ditty, and Hazlett-Stevens; Treating Generalized Anxiety Disorder by Rygh and Ida Rogue).  Diagnosis:  Generalized Anxiety Disorder Major Depressive Disorder, Recurrent Episode  Plan:  -next appointment will be Tuesday, November 27, 2022 at 8am.

## 2022-11-13 ENCOUNTER — Encounter: Payer: Self-pay | Admitting: Family Medicine

## 2022-11-13 ENCOUNTER — Telehealth (INDEPENDENT_AMBULATORY_CARE_PROVIDER_SITE_OTHER): Payer: Federal, State, Local not specified - PPO | Admitting: Family Medicine

## 2022-11-13 DIAGNOSIS — F419 Anxiety disorder, unspecified: Secondary | ICD-10-CM | POA: Diagnosis not present

## 2022-11-13 DIAGNOSIS — F5101 Primary insomnia: Secondary | ICD-10-CM | POA: Diagnosis not present

## 2022-11-13 MED ORDER — TRAZODONE HCL 50 MG PO TABS: 25.0000 mg | ORAL_TABLET | Freq: Every evening | ORAL | 3 refills | Status: DC | PRN

## 2022-11-13 MED ORDER — FLUOXETINE HCL 40 MG PO CAPS
40.0000 mg | ORAL_CAPSULE | Freq: Every day | ORAL | 3 refills | Status: DC
Start: 2022-11-13 — End: 2023-08-30

## 2022-11-13 NOTE — Progress Notes (Signed)
Established patient visit   Patient: Kristen Nichols   DOB: 08-31-1955   67 y.o. Female  MRN: [id number] Visit Date: 11/13/2022  Today's healthcare provider: Charlton Amor, DO   No chief complaint on file.   SUBJECTIVE   No chief complaint on file.  HPI   I connected with  Kristen Nichols on 11/13/22 by a video and audio enabled telemedicine application and verified that I am speaking with the correct person using two identifiers.  Patient Location: Home  Provider Location: Office/Clinic  I discussed the limitations of evaluation and management by telemedicine. The patient expressed understanding and agreed to proceed.  Pt presents for visit to discuss worsening anxiety and depression and insomnia. Has had some trouble with her daughter.   Review of Systems  Constitutional:  Negative for activity change, fatigue and fever.  Respiratory:  Negative for cough and shortness of breath.   Cardiovascular:  Negative for chest pain.  Gastrointestinal:  Negative for abdominal pain.  Genitourinary:  Negative for difficulty urinating.       Current Meds  Medication Sig   buPROPion (WELLBUTRIN XL) 150 MG 24 hr tablet TAKE 1 TABLET BY MOUTH EVERY DAY   chlorpheniramine-HYDROcodone (TUSSIONEX) 10-8 MG/5ML Take 5 mLs by mouth every 12 (twelve) hours as needed for cough (cough, will cause drowsiness.).   fexofenadine-pseudoephedrine (ALLEGRA-D) 60-120 MG 12 hr tablet Take 1 tablet by mouth daily.   FLUoxetine (PROZAC) 20 MG tablet TAKE 1 TABLET BY MOUTH EVERY DAY   FLUoxetine (PROZAC) 40 MG capsule Take 1 capsule (40 mg total) by mouth daily.   hydrochlorothiazide (HYDRODIURIL) 12.5 MG tablet TAKE 1 TABLET BY MOUTH EVERY DAY   meloxicam (MOBIC) 15 MG tablet Take 1 tablet (15 mg total) by mouth daily.   polyethylene glycol (MIRALAX / GLYCOLAX) 17 g packet Take 17 g by mouth daily.   Propylene Glycol (SYSTANE BALANCE) 0.6 % SOLN Place 1 drop into both eyes 2 (two) times daily as  needed (dry/irritated eyes).   simethicone (MYLICON) 80 MG chewable tablet Chew 1 tablet (80 mg total) by mouth every 6 (six) hours as needed for flatulence (gas, bloating, abdominal discomfort).   sucralfate (CARAFATE) 1 GM/10ML suspension Take 10 mLs (1 g total) by mouth 4 (four) times daily.   traZODone (DESYREL) 50 MG tablet Take 0.5-1 tablets (25-50 mg total) by mouth at bedtime as needed for sleep.   Current Facility-Administered Medications for the 11/13/22 encounter (Video Visit) with Charlton Amor, DO  Medication   dexamethasone (DECADRON) injection 4 mg   triamcinolone acetonide (KENALOG) 10 MG/ML injection 10 mg    OBJECTIVE      Physical Exam Vitals reviewed.  Constitutional:      Appearance: She is well-developed.  HENT:     Head: Normocephalic and atraumatic.  Eyes:     Conjunctiva/sclera: Conjunctivae normal.  Cardiovascular:     Rate and Rhythm: Normal rate.  Pulmonary:     Effort: Pulmonary effort is normal.  Skin:    General: Skin is dry.     Coloration: Skin is not pale.  Neurological:     Mental Status: She is alert and oriented to person, place, and time.  Psychiatric:        Behavior: Behavior normal.        ASSESSMENT & PLAN    Problem List Items Addressed This Visit       Other   Insomnia    Will go ahead and do trazodone  25mg  to start  - gave pt follow up instructions - also recommended she reach out via mychart and let me know how she is doing to see if we need to increase to 50mg  or 100mg       Relevant Medications   traZODone (DESYREL) 50 MG tablet   Anxiety - Primary    Will go ahead and increase prozac to 40mg  to see if this helps. Pt notes an increase in irritability and I am hoping the increase in dosage will help with this       Relevant Medications   FLUoxetine (PROZAC) 40 MG capsule   traZODone (DESYREL) 50 MG tablet    No follow-ups on file.      Meds ordered this encounter  Medications   FLUoxetine (PROZAC) 40 MG  capsule    Sig: Take 1 capsule (40 mg total) by mouth daily.    Dispense:  90 capsule    Refill:  3   traZODone (DESYREL) 50 MG tablet    Sig: Take 0.5-1 tablets (25-50 mg total) by mouth at bedtime as needed for sleep.    Dispense:  30 tablet    Refill:  3    No orders of the defined types were placed in this encounter.    Charlton Amor, DO  [facility] at [facility] [phone] (phone) [phone] (fax)  [facility]

## 2022-11-13 NOTE — Assessment & Plan Note (Signed)
Will go ahead and increase prozac to 40mg  to see if this helps. Pt notes an increase in irritability and I am hoping the increase in dosage will help with this

## 2022-11-13 NOTE — Assessment & Plan Note (Signed)
Will go ahead and do trazodone 25mg  to start  - gave pt follow up instructions - also recommended she reach out via mychart and let me know how she is doing to see if we need to increase to 50mg  or 100mg 

## 2022-11-13 NOTE — Telephone Encounter (Signed)
Patient scheduled.

## 2022-11-27 ENCOUNTER — Encounter: Payer: Self-pay | Admitting: Professional

## 2022-11-27 ENCOUNTER — Ambulatory Visit (INDEPENDENT_AMBULATORY_CARE_PROVIDER_SITE_OTHER): Payer: Federal, State, Local not specified - PPO | Admitting: Professional

## 2022-11-27 DIAGNOSIS — F339 Major depressive disorder, recurrent, unspecified: Secondary | ICD-10-CM | POA: Diagnosis not present

## 2022-11-27 DIAGNOSIS — F411 Generalized anxiety disorder: Secondary | ICD-10-CM

## 2022-11-27 DIAGNOSIS — F4321 Adjustment disorder with depressed mood: Secondary | ICD-10-CM

## 2022-11-27 NOTE — Progress Notes (Signed)
[facility] Counselor/Therapist Progress Note  Patient ID: Kristen Nichols, MRN: [id number],    Date: 11/27/2022  Time Spent: 46 minutes [id number]  Treatment Type: Individual Therapy  Risk Assessment: Danger to Self:  No Self-injurious Behavior: No Danger to Others: No  Subjective: This session was held via video teletherapy. The patient consented to video teletherapy and was located in her office during this session. She is aware it is the responsibility of the patient to secure confidentiality on her end of the session. The provider was in a private home office for the duration of this session.    The patient arrived on time for her Caregility appointment.  Issues addressed: 1-medication -has had an increase -is now taking Trazodone for sleep -she is feeling brighter and less weepy -pt has resumed normal activities and feels better 2-Kristen Nichols -went to visit her and had a good visit -pt doesn't feel bad and realizes that she does not feel responsible -pt got a blanket apology but wants apology to address how she has been wronged -pt has noticed that she has taken things at surface level and wants to dig somewhat -pt wants to know why she chose to fake a pregnancy -pt will not allow her to move into her and Kristen Nichols's house -Kristen Nichols has talked wit her a few times but he has made it clear that they are done 3-father and sister Kristen Nichols -pt is looking forward to her father moving in -Kristen Nichols is fully supportive and actually suggested it to her dad and then to her -Kristen Nichols moved in with her father to help care for him -Kristen Nichols dog attacked her father's dog causing a $1500 vet bill -her father is very angry -pt's father and Kristen Nichols are not talking to each other -Kristen Nichols is 53 and recently divorced -pt is supportive of both her father and her sister 4-mood -she is not yet back to her baseline and states she is 65-70% there but will make it the rest of the way -she worries  about the future of Kristen Nichols -they are good and are working together to get the home organized after their renovation -she is going to [name] with her husband this year -Kristen Nichols is still very angry with Kristen Nichols    2024 Treatment Plan Problems: Anxiety, Unipolar Depression, Phase Of Life Problems, Grief / Loss Unresolved  Symptoms: Hypervigilance (e.g., feeling constantly on edge, experiencing concentration difficulties, having trouble falling or staying asleep, exhibiting a general state of irritability). Excessive and/or unrealistic worry that is difficult to control occurring more days than not for at least 6 months about a number of events or activities. Thoughts dominated by loss coupled with poor concentration, tearful spells, and confusion about the future. Serial losses in life (i.e., deaths, divorces, jobs) that led to depression and discouragement. Frustration and anxiety related to providing oversight and caretaking to an aging, ailing, and dependent parent. Restlessness and feelings of lost identity and meaning due to retirement. Depressed or irritable mood. Poor concentration and indecisiveness. Unresolved grief issues. History of chronic or recurrent depression for which the client has taken antidepressant medication, been hospitalized, had outpatient treatment, or had a course of electroconvulsive therapy.  Goals: Resolve the core conflict that is the source of anxiety. Appropriately grieve the loss in order to normalize mood and to return to previously adaptive level of functioning. Develop healthy thinking patterns and beliefs about self, others, and the world that lead to the alleviation and help prevent the relapse of depression. Reorient life view  to recognize the advantages of the current situation. Balance life activities between consideration of others and development of own interests. Resolve the loss, reengaging in old relationships and initiating new  contacts with others. Begin a healthy grieving process around the loss. Learn and implement coping skills that result in a reduction of anxiety and worry, and improved daily functioning.  Objectives target date for all objectives is 11/12/2023: Learn and implement calming skills to reduce overall anxiety and manage anxiety symptoms. Identify and engage in pleasant activities on a daily basis. Learn to accept limitations in life and commit to tolerating, rather than avoiding, unpleasant emotions while accomplishing meaningful goals. Report decreased time spent each day focusing on the loss. Express thoughts and feelings about the deceased that went unexpressed while the deceased was alive. Identify what stages of grief have been experienced in the continuum of the grieving process. Begin verbalizing feelings associated with the loss. Read books on the topic of grief to better understand the loss experience and to increase a sense of hope. Identify values that guide life's decisions and determine fulfillment. Implement increased assertiveness to take control of conflicts. Increase communication with significant others regarding current life stress factors. Increase activities that reinforce a positive self-identity. Read self-help book on the difficult transition life is presenting currently. Increase social contacts to reduce sense of isolation. Implement mindfulness techniques for relapse prevention. Verbalize an understanding and resolution of current interpersonal problems. Learn and implement conflict resolution skills to resolve interpersonal problems. Increasingly verbalize hopeful and positive statements regarding self, others, and the future.  Interventions: Conduct an empty-chair exercise with the client where he/she focuses on expressing to the lost loved one imagined in the chair what he/she never said while that loved one was alive. Ask the client to write a letter to the lost  person describing his/her fond memories and/or painful and regretful memories, and how he/she currently feels life (or assign "Dear _____: A Letter to a Lost Loved One" in the Adult Psychotherapy Homework Planner by [facility]); process the letter in session. Develop a grieving ritual with an identified feeling state (e.g., dress in dark colors, preferably black, to indicate deep sorrow) which the client may focus on near the anniversary of the loss. Process what he/she received from the ritual. Ask the client to read books on grief and loss (e.g., Getting to the Other Side of Grief: Overcoming the Loss of a Spouse by Zonnebelt-Smeenge and Joana Reamer; Good Grief by Alphia Kava; When Bad Things Happen to Good People by Pryor Montes; How Can It Be All Right When Everything Is All Wrong? by [facility]); process the content. Assist the client in identifying the stages of grief that he/she has experienced and which stage he/she is presently working through. Assist the client in identifying and expressing feelings connected with his/her loss. Ask the client to bring pictures or mementos connected with his/her loss to a session and talk about them (or assign "Creating a [place]" in the Adult Psychotherapy Homework Planner by Stephannie Li). Assist the client in clarifying his/her identity and meaning in life by listing his/her strengths, positive traits and talents, potential ways to contribute to society, and areas of interest and ability that have not yet been developed (or assign "What's Good About Me and My Life?" from the Adult Psychotherapy Homework Planner by Stephannie Li). Develop an action plan with the client to increase activities that give meaning and expand his/her sense of identity at a time of transition in life phases (e.g., single to married,  employed to homemaker, childless to parent, employed to retired); Public house manager; suggest the client read material on transitioning in life (e.g., Managing Transitions:  Making the Most of Change or Transitions: Making Sense of Life's Changes by [employer]). Explore opportunities for the client to overcome his/her sense of isolation (e.g., joining a community recreational or educational group, becoming active in church or synagogue activities, taking formal education classes, enrolling in an exercise group, joining a hobby support group); encourage implementation of these activities. Use role-playing and modeling to teach the client social skills needed to reach out to build new relationships (e.g., starting conversations, introducing self, asking questions of others about themselves, smiling and being friendly, inviting new acquaintances to his/her home, initiating a social engagement or activity with a new acquaintance). Suggest reading material to the client on making the transition that is stressful (e.g., new marriage, new parent, becoming full-time homemaker, providing care to an aging parent, retirement, or adjusting to an "empty nest"); consult the Bibliotherapy Appendix for selected titles. Use role-playing, modeling, and behavior rehearsal to teach the client assertiveness skills that can be applied to reducing conflict or dissatisfaction. Encourage the client to read books on assertiveness and boundary setting (e.g., The Assertiveness Workbook: How to Express Your Ideas and Stand Up for Yourself at Work and in Relationships by Eloise Harman; Asserting Yourself by Lupe Carney; When I Say No, I Feel Guilty by Katrinka Blazing; Your Perfect Right by Carnella Guadalajara); process the content and its application to the client's daily life. Teach the client communication skills (e.g., "I messages," active listening, eye contact) to apply to his/her current life stress factors. Engage the client in behavioral activation, increasing the client's contact with sources of reward, identifying processes that inhibit activation, and teaching skills to solve life problems (or assign "Identify  and Schedule Pleasant Activities" in the Adult Psychotherapy Homework Planner by Stephannie Li); use behavioral techniques such as instruction, rehearsal, role-playing, role reversal as needed to assist adoption into the client's daily life; reinforce success. Use techniques from Acceptance and Commitment Therapy to help client accept uncomfortable realities such as lack of complete control, imperfections, and uncertainty and tolerate unpleasant emotions and thoughts in order to accomplish value-consistent goals. Teach the client more about depression and how to recognize and accept some sadness as a normal variation in feeling. Use mindfulness meditation and cognitive therapy techniques to help the client learn to recognize and regulate the negative thought processes associated with depression and to change his/her relationship with these thoughts (see Mindfulness-Based Cognitive Therapy for Depression by Chevis Pretty, and Shona Simpson). Help the client resolve depression related to interpersonal problems through the use of reassurance and support, clarification of cognitive and affective triggers that ignite conflicts, and active problem-solving (or assign "Applying Problem-Solving to Interpersonal Conflict" in the Adult Psychotherapy Homework Planner by Stephannie Li). For role transitions (e.g., beginning or ending a relationship or career, moving, promotion, retirement, graduation), help the client mourn the loss of the old role while recognizing positive and negative aspects of the new role, and taking steps to gain mastery over the new role. Assign the client to read about progressive muscle relaxation and other calming strategies in relevant books or treatment manuals (e.g., Progressive Relaxation Training by Robb Matar and Alen Blew; Mastery of Your Anxiety and Worry: Workbook by Earlie Counts). Teach the client calming/relaxation skills (e.g., applied relaxation, progressive muscle relaxation, cue-controlled  relaxation; mindful breathing; biofeedback) and how to discriminate better between relaxation and tension; teach the client how to apply these skills to  his/her daily life (e.g., New Directions in Progressive Muscle Relaxation by Marcelyn Ditty, and Hazlett-Stevens; Treating Generalized Anxiety Disorder by Rygh and Ida Rogue).  Diagnosis: Generalized Anxiety Disorder Major Depressive Disorder, Recurrent Episode  Plan:  -next appointment will be Tuesday, December 12, 2022 at 8am.

## 2022-12-08 ENCOUNTER — Other Ambulatory Visit: Payer: Self-pay | Admitting: Family Medicine

## 2022-12-08 DIAGNOSIS — F5101 Primary insomnia: Secondary | ICD-10-CM

## 2022-12-12 ENCOUNTER — Encounter: Payer: Self-pay | Admitting: Professional

## 2022-12-12 ENCOUNTER — Ambulatory Visit: Payer: Federal, State, Local not specified - PPO | Admitting: Professional

## 2022-12-12 DIAGNOSIS — F339 Major depressive disorder, recurrent, unspecified: Secondary | ICD-10-CM

## 2022-12-12 DIAGNOSIS — F4321 Adjustment disorder with depressed mood: Secondary | ICD-10-CM

## 2022-12-12 DIAGNOSIS — F411 Generalized anxiety disorder: Secondary | ICD-10-CM

## 2022-12-12 NOTE — Progress Notes (Signed)
[facility] Counselor/Therapist Progress Note  Patient ID: Kristen Nichols, MRN: [id number],    Date: 12/12/2022  Time Spent: 42 minutes 1003-1045am  Treatment Type: Individual Therapy  Risk Assessment: Danger to Self:  No Self-injurious Behavior: No Danger to Others: No  Subjective: This session was held via video teletherapy. The patient consented to video teletherapy and was located in her office during this session. She is aware it is the responsibility of the patient to secure confidentiality on her end of the session. The provider was in a private home office for the duration of this session.    The patient arrived on time for her Caregility appointment.  Issues addressed: 1-Thanksgiving went well -she and her husband celebrated with her father -this is her father's first holiday season without his second wife   -this is the second wife that he has lost -her younger sister went to middle sister's 2-[name] -[name] continue to be very angry with [name] -they are going to visit [name] tomorrow for her birthday -they will visit her for Christmas  3-retirement -pt plans to retire at the end of April -she and her spouse are going to solidify the plan -pt wants to get to her [age] year mark in March 4-anxiety -going away for a week and worries about family   -father going to be at her house   -her dog has cancer and what it he dies     -pt plans to think about whether she wants to know at the time it occurs or after her vacation   -BIL [name] has cancer and concern that he could die while she is gone  Objectives (Target date for all objectives is 11/12/2023): Learn and implement calming skills to reduce overall anxiety and manage anxiety symptoms. Identify and engage in pleasant activities on a daily basis. Learn to accept limitations in life and commit to tolerating, rather than avoiding, unpleasant emotions while accomplishing meaningful goals. Report  decreased time spent each day focusing on the loss. Express thoughts and feelings about the deceased that went unexpressed while the deceased was alive. Identify what stages of grief have been experienced in the continuum of the grieving process. Begin verbalizing feelings associated with the loss. Read books on the topic of grief to better understand the loss experience and to increase a sense of hope. Identify values that guide life's decisions and determine fulfillment. Implement increased assertiveness to take control of conflicts. Increase communication with significant others regarding current life stress factors. Increase activities that reinforce a positive self-identity. Read self-help book on the difficult transition life is presenting currently. Increase social contacts to reduce sense of isolation. Implement mindfulness techniques for relapse prevention. Verbalize an understanding and resolution of current interpersonal problems. Learn and implement conflict resolution skills to resolve interpersonal problems. Increasingly verbalize hopeful and positive statements regarding self, others, and the future. Interventions: Conduct an empty-chair exercise with the client where he/she focuses on expressing to the lost loved one imagined in the chair what he/she never said while that loved one was alive. Ask the client to write a letter to the lost person describing his/her fond memories and/or painful and regretful memories, and how he/she currently feels life (or assign "Dear _____: A Letter to a Lost Loved One" in the Adult Psychotherapy Homework Planner by [facility]); process the letter in session. Develop a grieving ritual with an identified feeling state (e.g., dress in dark colors, preferably black, to indicate deep sorrow) which the client may focus on  near the anniversary of the loss. Process what he/she received from the ritual. Ask the client to read books on grief and loss (e.g.,  Getting to the Other Side of Grief: Overcoming the Loss of a Spouse by Zonnebelt-Smeenge and Joana Reamer; Good Grief by Alphia Kava; When Bad Things Happen to Good People by Pryor Montes; How Can It Be All Right When Everything Is All Wrong? by [facility]); process the content. Assist the client in identifying the stages of grief that he/she has experienced and which stage he/she is presently working through. Assist the client in identifying and expressing feelings connected with his/her loss. Ask the client to bring pictures or mementos connected with his/her loss to a session and talk about them (or assign "Creating a [employer] [place] [employer]" in the Adult Psychotherapy Homework Planner by Stephannie Li). Assist the client in clarifying his/her identity and meaning in life by listing his/her strengths, positive traits and talents, potential ways to contribute to society, and areas of interest and ability that have not yet been developed (or assign "What's Good About Me and My Life?" from the Adult Psychotherapy Homework Planner by Stephannie Li). Develop an action plan with the client to increase activities that give meaning and expand his/her sense of identity at a time of transition in life phases (e.g., single to married, employed to homemaker, childless to parent, employed to retired); Public house manager; suggest the client read material on transitioning in life (e.g., Managing Transitions: Making the Most of Change or Transitions: Making Sense of Life's Changes by [employer]). Explore opportunities for the client to overcome his/her sense of isolation (e.g., joining a community recreational or educational group, becoming active in church or synagogue activities, taking formal education classes, enrolling in an exercise group, joining a hobby support group); encourage implementation of these activities. Use role-playing and modeling to teach the client social skills needed to reach out to build new relationships (e.g., starting  conversations, introducing self, asking questions of others about themselves, smiling and being friendly, inviting new acquaintances to his/her home, initiating a social engagement or activity with a new acquaintance). Suggest reading material to the client on making the transition that is stressful (e.g., new marriage, new parent, becoming full-time homemaker, providing care to an aging parent, retirement, or adjusting to an "empty nest"); consult the Bibliotherapy Appendix for selected titles. Use role-playing, modeling, and behavior rehearsal to teach the client assertiveness skills that can be applied to reducing conflict or dissatisfaction. Encourage the client to read books on assertiveness and boundary setting (e.g., The Assertiveness Workbook: How to Express Your Ideas and Stand Up for Yourself at Work and in Relationships by Eloise Harman; Asserting Yourself by Lupe Carney; When I Say No, I Feel Guilty by Katrinka Blazing; Your Perfect Right by Carnella Guadalajara); process the content and its application to the client's daily life. Teach the client communication skills (e.g., "I messages," active listening, eye contact) to apply to his/her current life stress factors. Engage the client in behavioral activation, increasing the client's contact with sources of reward, identifying processes that inhibit activation, and teaching skills to solve life problems (or assign "Identify and Schedule Pleasant Activities" in the Adult Psychotherapy Homework Planner by Stephannie Li); use behavioral techniques such as instruction, rehearsal, role-playing, role reversal as needed to assist adoption into the client's daily life; reinforce success. Use techniques from Acceptance and Commitment Therapy to help client accept uncomfortable realities such as lack of complete control, imperfections, and uncertainty and tolerate unpleasant emotions and thoughts in order to accomplish value-consistent goals. Teach  the client more about  depression and how to recognize and accept some sadness as a normal variation in feeling. Use mindfulness meditation and cognitive therapy techniques to help the client learn to recognize and regulate the negative thought processes associated with depression and to change his/her relationship with these thoughts (see Mindfulness-Based Cognitive Therapy for Depression by Chevis Pretty, and Shona Simpson). Help the client resolve depression related to interpersonal problems through the use of reassurance and support, clarification of cognitive and affective triggers that ignite conflicts, and active problem-solving (or assign "Applying Problem-Solving to Interpersonal Conflict" in the Adult Psychotherapy Homework Planner by Stephannie Li). For role transitions (e.g., beginning or ending a relationship or career, moving, promotion, retirement, graduation), help the client mourn the loss of the old role while recognizing positive and negative aspects of the new role, and taking steps to gain mastery over the new role. Assign the client to read about progressive muscle relaxation and other calming strategies in relevant books or treatment manuals (e.g., Progressive Relaxation Training by Robb Matar and Alen Blew; Mastery of Your Anxiety and Worry: Workbook by Earlie Counts). Teach the client calming/relaxation skills (e.g., applied relaxation, progressive muscle relaxation, cue-controlled relaxation; mindful breathing; biofeedback) and how to discriminate better between relaxation and tension; teach the client how to apply these skills to his/her daily life (e.g., New Directions in Progressive Muscle Relaxation by Marcelyn Ditty, and Hazlett-Stevens; Treating Generalized Anxiety Disorder by Rygh and Ida Rogue).  Diagnosis: Generalized Anxiety Disorder Major Depressive Disorder, Recurrent Episode  Plan:  -next appointment will be Wednesday, January 15, 2022 at 11am.

## 2022-12-26 ENCOUNTER — Ambulatory Visit: Payer: Federal, State, Local not specified - PPO | Admitting: Professional

## 2023-01-16 ENCOUNTER — Encounter: Payer: Self-pay | Admitting: Professional

## 2023-01-16 ENCOUNTER — Ambulatory Visit: Payer: Medicare Other | Admitting: Professional

## 2023-01-16 DIAGNOSIS — F339 Major depressive disorder, recurrent, unspecified: Secondary | ICD-10-CM | POA: Diagnosis not present

## 2023-01-16 DIAGNOSIS — F411 Generalized anxiety disorder: Secondary | ICD-10-CM

## 2023-01-16 DIAGNOSIS — F4321 Adjustment disorder with depressed mood: Secondary | ICD-10-CM

## 2023-01-16 NOTE — Progress Notes (Signed)
 [facility] Counselor/Therapist Progress Note  Patient ID: Kristen Nichols, MRN: [id number],    Date: 01/16/2023  Time Spent: 50 minutes 1002-1052am  Treatment Type: Individual Therapy  Risk Assessment: Danger to Self:  No Self-injurious Behavior: No Danger to Others: No  Subjective: This session was held via video teletherapy. The patient consented to video teletherapy and was located in her office during this session. She is aware it is the responsibility of the patient to secure confidentiality on her end of the session. The provider was in a private home office for the duration of this session.    The patient arrived on time for her Caregility appointment.  Issues addressed: 1-word for the year is Pause 2-Christmas was at her home a-son Hosey and his family came over and they had a wonderful time b-sister Dorthea brought father and her children -father was sad since it is hi first Christmas without his second wife Jerelene c-[name] and her children had a divorce and pt wanted to ensure that they had a good Christmas -the relationship with he sisters has been healed -the relationship between Lenox and sister Dorthea has been healed d-pt's father complimented her on their Christmas and told her that her mother would be proud e-sweet moment -her mother always gave each of her girls the same fight -the pt found three christmas gifts wrapped from their mother -Dorthea opened hers and said it was her most special Christmas ever 3-vacation -was the best vacation they have ever had -so relaxing 4-[name] -she completed her program prior to pt leaving on vacation and pt reports she lost it -recovery center facilitated a referral to a recovery housing program that moved her and pt had to do nothing 5-retirement -pt definitely plans to retire and wants to get everything in order 6-anxiety 7-BIL [place] -had no mobility was wheelchair bound -Laurie sent a picture of Medford  on Christmas Eve and he was standing -the tumor is not growing and has diminished in size -he got mor time  Objectives (Target date for all objectives is 11/12/2023): Learn and implement calming skills to reduce overall anxiety and manage anxiety symptoms. Identify and engage in pleasant activities on a daily basis. Learn to accept limitations in life and commit to tolerating, rather than avoiding, unpleasant emotions while accomplishing meaningful goals. Report decreased time spent each day focusing on the loss. Express thoughts and feelings about the deceased that went unexpressed while the deceased was alive. Identify what stages of grief have been experienced in the continuum of the grieving process. Begin verbalizing feelings associated with the loss. Read books on the topic of grief to better understand the loss experience and to increase a sense of hope. Identify values that guide life's decisions and determine fulfillment. Implement increased assertiveness to take control of conflicts. Increase communication with significant others regarding current life stress factors. Increase activities that reinforce a positive self-identity. Read self-help book on the difficult transition life is presenting currently. Increase social contacts to reduce sense of isolation. Implement mindfulness techniques for relapse prevention. Verbalize an understanding and resolution of current interpersonal problems. Learn and implement conflict resolution skills to resolve interpersonal problems. Increasingly verbalize hopeful and positive statements regarding self, others, and the future. Interventions: Conduct an empty-chair exercise with the client where he/she focuses on expressing to the lost loved one imagined in the chair what he/she never said while that loved one was alive. Ask the client to write a letter to the lost person describing his/her fond  memories and/or painful and regretful memories, and  how he/she currently feels life (or assign Dear _____: A Letter to a Lost Loved One in the Adult Psychotherapy Homework Planner by [facility]); process the letter in session. Develop a grieving ritual with an identified feeling state (e.g., dress in dark colors, preferably black, to indicate deep sorrow) which the client may focus on near the anniversary of the loss. Process what he/she received from the ritual. Ask the client to read books on grief and loss (e.g., Getting to the Other Side of Grief: Overcoming the Loss of a Spouse by Zonnebelt-Smeenge and Everitt Estimable; Good Grief by Berwyn; When Bad Things Happen to Good People by Ceil; How Can It Be All Right When Everything Is All Wrong? by [facility]); process the content. Assist the client in identifying the stages of grief that he/she has experienced and which stage he/she is presently working through. Assist the client in identifying and expressing feelings connected with his/her loss. Ask the client to bring pictures or mementos connected with his/her loss to a session and talk about them (or assign Creating a Patent Examiner in the Adult Psychotherapy Administrator, Arts by Jenniffer). Assist the client in clarifying his/her identity and meaning in life by listing his/her strengths, positive traits and talents, potential ways to contribute to society, and areas of interest and ability that have not yet been developed (or assign What's Good About Me and My Life? from the Adult Psychotherapy Homework Planner by Jenniffer). Develop an action plan with the client to increase activities that give meaning and expand his/her sense of identity at a time of transition in life phases (e.g., single to married, employed to homemaker, childless to parent, employed to retired); public house manager; suggest the client read material on transitioning in life (e.g., Managing Transitions: Making the Most of Change or Transitions: Making Sense of Life's Changes by  [employer]). Explore opportunities for the client to overcome his/her sense of isolation (e.g., joining a community recreational or educational group, becoming active in church or synagogue activities, taking formal education classes, enrolling in an exercise group, joining a hobby support group); encourage implementation of these activities. Use role-playing and modeling to teach the client social skills needed to reach out to build new relationships (e.g., starting conversations, introducing self, asking questions of others about themselves, smiling and being friendly, inviting new acquaintances to his/her home, initiating a social engagement or activity with a new acquaintance). Suggest reading material to the client on making the transition that is stressful (e.g., new marriage, new parent, becoming full-time homemaker, providing care to an aging parent, retirement, or adjusting to an empty nest); consult the Bibliotherapy Appendix for selected titles. Use role-playing, modeling, and behavior rehearsal to teach the client assertiveness skills that can be applied to reducing conflict or dissatisfaction. Encourage the client to read books on assertiveness and boundary setting (e.g., The Assertiveness Workbook: How to Express Your Ideas and Stand Up for Yourself at Work and in Relationships by Yolande; Asserting Yourself by Seward armin Seward; When I Say No, I Feel Guilty by Claudene; Your Perfect Right by Darrelyn armin Sick); process the content and its application to the client's daily life. Teach the client communication skills (e.g., I messages, active listening, eye contact) to apply to his/her current life stress factors. Engage the client in behavioral activation, increasing the client's contact with sources of reward, identifying processes that inhibit activation, and teaching skills to solve life problems (or assign Identify and Schedule Pleasant Activities in the Adult  Psychotherapy Administrator, Arts  by Jenniffer); use behavioral techniques such as instruction, rehearsal, role-playing, role reversal as needed to assist adoption into the client's daily life; reinforce success. Use techniques from Acceptance and Commitment Therapy to help client accept uncomfortable realities such as lack of complete control, imperfections, and uncertainty and tolerate unpleasant emotions and thoughts in order to accomplish value-consistent goals. Teach the client more about depression and how to recognize and accept some sadness as a normal variation in feeling. Use mindfulness meditation and cognitive therapy techniques to help the client learn to recognize and regulate the negative thought processes associated with depression and to change his/her relationship with these thoughts (see Mindfulness-Based Cognitive Therapy for Depression by Kriste Pouch, and Jil). Help the client resolve depression related to interpersonal problems through the use of reassurance and support, clarification of cognitive and affective triggers that ignite conflicts, and active problem-solving (or assign Applying Problem-Solving to Interpersonal Conflict in the Adult Psychotherapy Homework Planner by Jenniffer). For role transitions (e.g., beginning or ending a relationship or career, moving, promotion, retirement, graduation), help the client mourn the loss of the old role while recognizing positive and negative aspects of the new role, and taking steps to gain mastery over the new role. Assign the client to read about progressive muscle relaxation and other calming strategies in relevant books or treatment manuals (e.g., Progressive Relaxation Training by Thornell and Elmer; Mastery of Your Anxiety and Worry: Workbook by Richarda armin Given). Teach the client calming/relaxation skills (e.g., applied relaxation, progressive muscle relaxation, cue-controlled relaxation; mindful breathing; biofeedback) and how to discriminate better  between relaxation and tension; teach the client how to apply these skills to his/her daily life (e.g., New Directions in Progressive Muscle Relaxation by Thornell Elmer, and Hazlett-Stevens; Treating Generalized Anxiety Disorder by Rygh and Red).  Diagnosis: Generalized Anxiety Disorder Major Depressive Disorder, Recurrent Episode  Plan:  -next appointment will be Tuesday, January 12, 2023 at 9am.

## 2023-01-29 ENCOUNTER — Ambulatory Visit (INDEPENDENT_AMBULATORY_CARE_PROVIDER_SITE_OTHER): Payer: Medicare Other | Admitting: Sports Medicine

## 2023-01-29 DIAGNOSIS — R200 Anesthesia of skin: Secondary | ICD-10-CM

## 2023-01-29 DIAGNOSIS — R202 Paresthesia of skin: Secondary | ICD-10-CM

## 2023-01-29 DIAGNOSIS — M5412 Radiculopathy, cervical region: Secondary | ICD-10-CM | POA: Insufficient documentation

## 2023-01-29 NOTE — Assessment & Plan Note (Addendum)
Very pleasant 68 year old female, she has had several months of increasing numbness and tingling right hand, she localizes the paresthesias in the entire hand including the pinky. Worse at night, worse when gripping the steering well. On exam she has a positive Phalen and Tinel's sign reproducing her hand symptoms but sparing the pinky, she also has a positive cubital tunnel Tinel's sign reproducing her pinky symptoms. I explained her the anatomy and pathophysiology of carpal tunnel syndrome and cubital tunnel syndrome as well as the importance of avoiding deep flexion through the night. We will get her carpal tunnel and cubital tunnel splints, she will do some home conditioning for carpal and cubital tunnel syndrome. Return to see me in 6 weeks, hydrodissections of 1 or both if not better.  Update: EMG nerve conduction study does confirm right C7 radiculopathy, adding C-spine x-rays, home conditioning as well as formal PT, 5 days of prednisone, keep previously agreed follow-up and we can consider MRI for epidural planning if not better.

## 2023-01-29 NOTE — Progress Notes (Addendum)
    Procedures performed today:    None.  Independent interpretation of notes and tests performed by another provider:   None.  Brief History, Exam, Impression, and Recommendations:    Numbness and tingling of right arm Very pleasant 68 year old female, she has had several months of increasing numbness and tingling right hand, she localizes the paresthesias in the entire hand including the pinky. Worse at night, worse when gripping the steering well. On exam she has a positive Phalen and Tinel's sign reproducing her hand symptoms but sparing the pinky, she also has a positive cubital tunnel Tinel's sign reproducing her pinky symptoms. I explained her the anatomy and pathophysiology of carpal tunnel syndrome and cubital tunnel syndrome as well as the importance of avoiding deep flexion through the night. We will get her carpal tunnel and cubital tunnel splints, she will do some home conditioning for carpal and cubital tunnel syndrome. Return to see me in 6 weeks, hydrodissections of 1 or both if not better.  Update: EMG nerve conduction study does confirm right C7 radiculopathy, adding C-spine x-rays, home conditioning as well as formal PT, 5 days of prednisone, keep previously agreed follow-up and we can consider MRI for epidural planning if not better.    ____________________________________________ Ihor Austin. Benjamin Stain, M.D., ABFM., CAQSM., AME. Primary Care and Sports Medicine [facility]  Adjunct Professor of Family Medicine  [employer]  Restaurant manager, fast food

## 2023-01-29 NOTE — Patient Instructions (Signed)
 Marland Kitchen

## 2023-02-12 ENCOUNTER — Encounter: Payer: Self-pay | Admitting: Professional

## 2023-02-12 ENCOUNTER — Ambulatory Visit (INDEPENDENT_AMBULATORY_CARE_PROVIDER_SITE_OTHER): Payer: Medicare Other | Admitting: Professional

## 2023-02-12 DIAGNOSIS — F339 Major depressive disorder, recurrent, unspecified: Secondary | ICD-10-CM | POA: Diagnosis not present

## 2023-02-12 DIAGNOSIS — F411 Generalized anxiety disorder: Secondary | ICD-10-CM | POA: Diagnosis not present

## 2023-02-12 DIAGNOSIS — F4321 Adjustment disorder with depressed mood: Secondary | ICD-10-CM

## 2023-02-12 NOTE — Progress Notes (Signed)
 [facility] Counselor/Therapist Progress Note  Patient ID: Kristen Nichols, MRN: [id number],    Date: 02/12/2023  Time Spent: 50 minutes 905-955am  Treatment Type: Individual Therapy  Risk Assessment: Danger to Self:  No Self-injurious Behavior: No Danger to Others: No  Subjective: This session was held via video teletherapy. The patient consented to video teletherapy and was located in her office during this session. She is aware it is the responsibility of the patient to secure confidentiality on her end of the session. The provider was in a private home office for the duration of this session.    The patient arrived on time for her Caregility appointment.  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   -she was able to work from home beginning in 2016 and when Covid hit it was not  b-her husband is ready and telling her to do so when she is ready c-pt feels done but if she were to work from home PT she would be willing to stay -there is only one other person who has been at her job longer than her; she is the highest regarding tenure -consider only working within her required hours and practice staying home and not being at work at [employer] a-father is ready to move in with patient and her husband since [place] is moving out [employer] moving out of father's home -another incident with dogs and her sister's dog attacked her father's dog that belonged to his late wife -her father is relieved c-[name] has secured a job and moves field seismologist with son Hosey and DIL -her son has shared that he and his wife want a relationship with Lavonia -he appeared  softer toward her 4-physical -pt had consultation regarding cataracts -pt is excited -beginning March will start and have both eyes completed  Objectives (Target date for all objectives is 11/12/2023): Learn and implement calming skills to reduce overall anxiety and manage anxiety symptoms. Identify and engage in pleasant activities on a daily basis. Learn to accept limitations in life and commit to tolerating, rather than avoiding, unpleasant emotions while accomplishing meaningful goals. Report decreased time spent each day focusing on the loss. Express thoughts and feelings about the deceased that went unexpressed while the deceased was alive. Identify what stages of grief have been experienced in the continuum of the grieving process. Begin verbalizing feelings associated with the loss. Read books on the topic of grief to better understand the loss experience and to increase a sense of hope. Identify values that guide life's decisions and determine fulfillment. Implement increased assertiveness to take control of conflicts. Increase communication with significant others regarding current life stress factors. Increase activities that reinforce a positive self-identity. Read self-help book on the difficult transition life is presenting currently. Increase social contacts to reduce sense of isolation. Implement mindfulness techniques for relapse prevention. Verbalize an understanding and resolution of current interpersonal problems. Learn and implement conflict resolution skills to resolve interpersonal problems. Increasingly verbalize hopeful and positive statements regarding self, others, and the future. Interventions: Conduct an empty-chair exercise with the client where he/she focuses on expressing to the lost loved one imagined in the chair what he/she never said while that loved one  was alive. Ask the client to write a letter to the lost person describing his/her fond memories  and/or painful and regretful memories, and how he/she currently feels life (or assign Dear _____: A Letter to a Lost Loved One in the Adult Psychotherapy Homework Planner by [facility]); process the letter in session. Develop a grieving ritual with an identified feeling state (e.g., dress in dark colors, preferably black, to indicate deep sorrow) which the client may focus on near the anniversary of the loss. Process what he/she received from the ritual. Ask the client to read books on grief and loss (e.g., Getting to the Other Side of Grief: Overcoming the Loss of a Spouse by Zonnebelt-Smeenge and Everitt Estimable; Good Grief by Berwyn; When Bad Things Happen to Good People by Ceil; How Can It Be All Right When Everything Is All Wrong? by [facility]); process the content. Assist the client in identifying the stages of grief that he/she has experienced and which stage he/she is presently working through. Assist the client in identifying and expressing feelings connected with his/her loss. Ask the client to bring pictures or mementos connected with his/her loss to a session and talk about them (or assign Creating a Patent Examiner in the Adult Psychotherapy Administrator, Arts by Jenniffer). Assist the client in clarifying his/her identity and meaning in life by listing his/her strengths, positive traits and talents, potential ways to contribute to society, and areas of interest and ability that have not yet been developed (or assign What's Good About Me and My Life? from the Adult Psychotherapy Homework Planner by Jenniffer). Develop an action plan with the client to increase activities that give meaning and expand his/her sense of identity at a time of transition in life phases (e.g., single to married, employed to homemaker, childless to parent, employed to retired); public house manager; suggest the client read material on transitioning in life (e.g., Managing Transitions: Making the Most of Change or Transitions:  Making Sense of Life's Changes by [employer]). Explore opportunities for the client to overcome his/her sense of isolation (e.g., joining a community recreational or educational group, becoming active in church or synagogue activities, taking formal education classes, enrolling in an exercise group, joining a hobby support group); encourage implementation of these activities. Use role-playing and modeling to teach the client social skills needed to reach out to build new relationships (e.g., starting conversations, introducing self, asking questions of others about themselves, smiling and being friendly, inviting new acquaintances to his/her home, initiating a social engagement or activity with a new acquaintance). Suggest reading material to the client on making the transition that is stressful (e.g., new marriage, new parent, becoming full-time homemaker, providing care to an aging parent, retirement, or adjusting to an empty nest); consult the Bibliotherapy Appendix for selected titles. Use role-playing, modeling, and behavior rehearsal to teach the client assertiveness skills that can be applied to reducing conflict or dissatisfaction. Encourage the client to read books on assertiveness and boundary setting (e.g., The Assertiveness Workbook: How to Express Your Ideas and Stand Up for Yourself at Work and in Relationships by Yolande; Asserting Yourself by Seward armin Seward; When I Say No, I Feel Guilty by Claudene; Your Perfect Right by Darrelyn armin Sick); process the content and its application to the client's daily life. Teach the client communication skills (e.g., I messages, active listening, eye contact) to apply to his/her current life stress factors. Engage the client in behavioral activation, increasing the client's contact with sources of reward, identifying processes that inhibit activation, and  teaching skills to solve life problems (or assign Identify and Schedule Pleasant Activities in the  Adult Psychotherapy Homework Planner by Jenniffer); use behavioral techniques such as instruction, rehearsal, role-playing, role reversal as needed to assist adoption into the client's daily life; reinforce success. Use techniques from Acceptance and Commitment Therapy to help client accept uncomfortable realities such as lack of complete control, imperfections, and uncertainty and tolerate unpleasant emotions and thoughts in order to accomplish value-consistent goals. Teach the client more about depression and how to recognize and accept some sadness as a normal variation in feeling. Use mindfulness meditation and cognitive therapy techniques to help the client learn to recognize and regulate the negative thought processes associated with depression and to change his/her relationship with these thoughts (see Mindfulness-Based Cognitive Therapy for Depression by Kriste Pouch, and Jil). Help the client resolve depression related to interpersonal problems through the use of reassurance and support, clarification of cognitive and affective triggers that ignite conflicts, and active problem-solving (or assign Applying Problem-Solving to Interpersonal Conflict in the Adult Psychotherapy Homework Planner by Jenniffer). For role transitions (e.g., beginning or ending a relationship or career, moving, promotion, retirement, graduation), help the client mourn the loss of the old role while recognizing positive and negative aspects of the new role, and taking steps to gain mastery over the new role. Assign the client to read about progressive muscle relaxation and other calming strategies in relevant books or treatment manuals (e.g., Progressive Relaxation Training by Thornell and Elmer; Mastery of Your Anxiety and Worry: Workbook by Richarda armin Given). Teach the client calming/relaxation skills (e.g., applied relaxation, progressive muscle relaxation, cue-controlled relaxation; mindful breathing;  biofeedback) and how to discriminate better between relaxation and tension; teach the client how to apply these skills to his/her daily life (e.g., New Directions in Progressive Muscle Relaxation by Thornell Elmer, and Hazlett-Stevens; Treating Generalized Anxiety Disorder by Rygh and Red).  Diagnosis: Generalized Anxiety Disorder Major Depressive Disorder, Recurrent Episode  Plan:  -practice going to work at 8am instead of 7 in preparation for retirement -talk with Adeline regarding forgiving Lavonia in preparation for her reaching out -determine plan for retirement -next appointment will be Tuesday, March 12, 2023 at 9am.

## 2023-02-19 ENCOUNTER — Encounter (INDEPENDENT_AMBULATORY_CARE_PROVIDER_SITE_OTHER): Payer: Medicare Other | Admitting: Sports Medicine

## 2023-02-19 DIAGNOSIS — R202 Paresthesia of skin: Secondary | ICD-10-CM

## 2023-02-19 DIAGNOSIS — R200 Anesthesia of skin: Secondary | ICD-10-CM | POA: Diagnosis not present

## 2023-02-19 NOTE — Telephone Encounter (Signed)

## 2023-02-20 ENCOUNTER — Other Ambulatory Visit: Payer: Self-pay

## 2023-02-20 DIAGNOSIS — R202 Paresthesia of skin: Secondary | ICD-10-CM

## 2023-02-21 ENCOUNTER — Ambulatory Visit (INDEPENDENT_AMBULATORY_CARE_PROVIDER_SITE_OTHER): Payer: Medicare Other | Admitting: Neurology

## 2023-02-21 ENCOUNTER — Encounter: Payer: Self-pay | Admitting: Sports Medicine

## 2023-02-21 DIAGNOSIS — R202 Paresthesia of skin: Secondary | ICD-10-CM

## 2023-02-21 DIAGNOSIS — M5412 Radiculopathy, cervical region: Secondary | ICD-10-CM

## 2023-02-21 MED ORDER — PREDNISONE 50 MG PO TABS: ORAL_TABLET | ORAL | 0 refills | Status: DC

## 2023-02-21 NOTE — Addendum Note (Signed)
Addended by: Monica Becton on: 02/21/2023 02:01 PM   Modules accepted: Orders

## 2023-02-21 NOTE — Addendum Note (Signed)
Addended by: Monica Becton on: 02/21/2023 01:03 PM   Modules accepted: Orders

## 2023-02-21 NOTE — Procedures (Signed)
  [facility]  [address] [place], [address]  [place], [place] [address] Tel: [phone] Fax: [phone] Test Date:  02/21/2023  Patient: Kristen Nichols DOB: [birth year] Physician: Nita Sickle, DO  Sex: Female Height: 5\' 2"  Ref Phys: Rodney Langton, MD  ID#: [id number]   Technician:    History: This is a 68 year old female referred for evaluation of right hand numbness and tingling.  NCV & EMG Findings: Extensive electrodiagnostic testing of the right upper extremity shows:  Right median, ulnar, and mixed palmar sensory responses are within normal limits. Right median and ulnar motor responses are within normal limits. Mild chronic motor axonal loss changes are seen affecting the pronator teres and triceps muscles, without accompanying active denervation.  Impression: Chronic C7 radiculopathy affecting the right upper extremity, mild. There is no evidence of carpal tunnel syndrome or an ulnar neuropathy affecting the right upper extremity.   ___________________________ Nita Sickle, DO    Nerve Conduction Studies   Stim Site NR Peak (ms) Norm Peak (ms) O-P Amp (V) Norm O-P Amp  Right Median Anti Sensory (2nd Digit)  32 C  Wrist    2.7 <3.8 40.4 >10  Right Ulnar Anti Sensory (5th Digit)  32 C  Wrist    2.4 <3.2 32.3 >5     Stim Site NR Onset (ms) Norm Onset (ms) O-P Amp (mV) Norm O-P Amp Site1 Site2 Delta-0 (ms) Dist (cm) Vel (m/s) Norm Vel (m/s)  Right Median Motor (Abd Poll Brev)  32 C  Wrist    2.7 <4.0 11.1 >5 Elbow Wrist 4.9 29.0 59 >50  Elbow    7.6  11.0         Right Ulnar Motor (Abd Dig Minimi)  32 C  Wrist    2.1 <3.1 10.2 >7 B Elbow Wrist 3.3 22.0 67 >50  B Elbow    5.4  9.5  A Elbow B Elbow 1.4 10.0 71 >50  A Elbow    6.8  9.0            Stim Site NR Peak (ms) Norm Peak (ms) P-T Amp (V) Site1 Site2 Delta-P (ms) Norm Delta (ms)  Right Median/Ulnar Palm Comparison (Wrist - 8cm)  32 C  Median Palm    1.6 <2.2 92.4 Median Palm Ulnar  Palm 0.1   Ulnar Palm    1.5 <2.2 15.0       Electromyography   Side Muscle Ins.Act Fibs Fasc Recrt Amp Dur Poly Activation Comment  Right 1stDorInt Nml Nml Nml Nml Nml Nml Nml Nml N/A  Right PronatorTeres Nml Nml Nml *1- *1+ *1+ *1+ Nml N/A  Right Biceps Nml Nml Nml Nml Nml Nml Nml Nml N/A  Right Triceps Nml Nml Nml *1- *1+ *1+ *1+ Nml N/A  Right Deltoid Nml Nml Nml Nml Nml Nml Nml Nml N/A      Waveforms:

## 2023-02-22 ENCOUNTER — Ambulatory Visit: Payer: Medicare Other

## 2023-02-22 DIAGNOSIS — R200 Anesthesia of skin: Secondary | ICD-10-CM

## 2023-02-22 DIAGNOSIS — M542 Cervicalgia: Secondary | ICD-10-CM | POA: Diagnosis not present

## 2023-03-12 ENCOUNTER — Ambulatory Visit: Payer: Medicare Other | Admitting: Professional

## 2023-03-13 ENCOUNTER — Ambulatory Visit: Payer: Medicare Other | Admitting: Rehabilitative and Restorative Service Providers"

## 2023-03-13 ENCOUNTER — Ambulatory Visit: Payer: Medicare Other | Admitting: Sports Medicine

## 2023-03-18 ENCOUNTER — Ambulatory Visit: Payer: Medicare Other | Admitting: Professional

## 2023-03-18 ENCOUNTER — Ambulatory Visit (INDEPENDENT_AMBULATORY_CARE_PROVIDER_SITE_OTHER): Admitting: Sports Medicine

## 2023-03-18 DIAGNOSIS — M5412 Radiculopathy, cervical region: Secondary | ICD-10-CM

## 2023-03-18 NOTE — Progress Notes (Signed)
    Procedures performed today:    None.  Independent interpretation of notes and tests performed by another provider:   None.  Brief History, Exam, Impression, and Recommendations:    Radiculitis of right cervical region Pam returns, she is a pleasant 68 year old female, she had several months of increasing numbness and tingling right pain, paresthesias entire hand including the pinky, worse at night, worse when gripping, she had some signs consistent with carpal tunnel syndrome including a positive Phalen and Tinel's sign. She also had a positive cubital tunnel Tinel's sign. Ultimately we gave her some splinting and she did not improve, we proceeded with a EMG nerve conduction that did confirm a right C7 radiculopathy, we added C-spine x-rays, home PT, prednisone and she returns today symptom-free, continue home conditioning and return to see me as needed.    ____________________________________________ Ihor Austin. Benjamin Stain, M.D., ABFM., CAQSM., AME. Primary Care and Sports Medicine [facility]  Adjunct Professor of Family Medicine  [employer]  Restaurant manager, fast food

## 2023-03-18 NOTE — Assessment & Plan Note (Signed)
 Pam returns, she is a pleasant 68 year old female, she had several months of increasing numbness and tingling right pain, paresthesias entire hand including the pinky, worse at night, worse when gripping, she had some signs consistent with carpal tunnel syndrome including a positive Phalen and Tinel's sign. She also had a positive cubital tunnel Tinel's sign. Ultimately we gave her some splinting and she did not improve, we proceeded with a EMG nerve conduction that did confirm a right C7 radiculopathy, we added C-spine x-rays, home PT, prednisone and she returns today symptom-free, continue home conditioning and return to see me as needed.

## 2023-03-19 ENCOUNTER — Ambulatory Visit: Admitting: Physical Therapy

## 2023-04-03 ENCOUNTER — Other Ambulatory Visit: Payer: Self-pay | Admitting: Family Medicine

## 2023-04-03 DIAGNOSIS — I10 Essential (primary) hypertension: Secondary | ICD-10-CM

## 2023-04-09 ENCOUNTER — Ambulatory Visit: Payer: Medicare Other | Admitting: Professional

## 2023-05-07 ENCOUNTER — Ambulatory Visit (INDEPENDENT_AMBULATORY_CARE_PROVIDER_SITE_OTHER): Payer: Medicare Other | Admitting: Professional

## 2023-05-07 ENCOUNTER — Encounter: Payer: Self-pay | Admitting: Professional

## 2023-05-07 DIAGNOSIS — F411 Generalized anxiety disorder: Secondary | ICD-10-CM

## 2023-05-07 DIAGNOSIS — F339 Major depressive disorder, recurrent, unspecified: Secondary | ICD-10-CM

## 2023-05-07 DIAGNOSIS — F4321 Adjustment disorder with depressed mood: Secondary | ICD-10-CM

## 2023-05-07 NOTE — Progress Notes (Signed)
 [facility] Counselor/Therapist Progress Note  Patient ID: Kristen Nichols, MRN: [id number],    Date: 05/07/2023  Time Spent: 48 minutes 902-950am  Treatment Type: Individual Therapy  Risk Assessment: Danger to Self:  No Self-injurious Behavior: No Danger to Others: No  Subjective: This session was held via video teletherapy. The patient consented to video teletherapy and was located in her office during this session. She is aware it is the responsibility of the patient to secure confidentiality on her end of the session. The provider was in a private home office for the duration of this session.    The patient arrived on time for her Caregility appointment.  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" -she feels comfortable with her shift in routine -determine plan for retirement- moved date to late August but has not announced 2-personal -talk with Evelynn Hirschfeld regarding forgiving Carol Chroman in preparation for her reaching out- did not happen because of her most current behaviors -she and Evelynn Hirschfeld do not talk about her -father moved in April 2nd -they are adapting to her father living in their home -she feels like she has a toddler in the house 3-[name] -pt had been seeing -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 -Carol Chroman lied again and did not work or attend school -pt got a call from the house mother who asked for her rent -she does have a job but at [employer] -pt pd for rent for Jan and Feb and said no more -she has not seen in a mont and a half -she will be seeing her next weekend -she is again seeing Misty Amour and he is a problem but he does love her 4-self-care -spending less time at work -spending more time at the barn -[employer] with [place] over weekend -cataract surgery completed  and opened a new world for her 5-professional -she attended conference and it was a great meeting -she is slowly backing out and knew that this was last conference -the staff are much younger and she doesn't really have much in common   Objectives (Target date for all objectives is 11/12/2023): Learn and implement calming skills to reduce overall anxiety and manage anxiety symptoms. Identify and engage in pleasant activities on a daily basis. Learn to accept limitations in life and commit to tolerating, rather than avoiding, unpleasant emotions while accomplishing meaningful goals. Report decreased time spent each day focusing on the loss. Express thoughts and feelings about the deceased that went unexpressed while the deceased was alive. Identify what stages of grief have been experienced in the continuum of the grieving process. Begin verbalizing feelings associated with the loss. Read books on the topic of grief to better understand the loss experience and to increase a sense of hope. Identify values that guide life's decisions and determine fulfillment. Implement increased assertiveness to take control of conflicts. Increase communication with significant others regarding current life stress factors. Increase activities that reinforce a positive self-identity. Read self-help book on the difficult transition life is presenting currently. Increase social contacts to reduce sense of isolation. Implement mindfulness techniques for relapse prevention. Verbalize an understanding and resolution of current interpersonal problems. Learn and implement conflict resolution skills to resolve interpersonal problems. Increasingly verbalize hopeful and positive statements regarding self, others, and the future. Interventions: Conduct an empty-chair exercise with the  client where he/she focuses on expressing to the lost loved one imagined in the chair what he/she never said while that loved one was  alive. Ask the client to write a letter to the lost person describing his/her fond memories and/or painful and regretful memories, and how he/she currently feels life (or assign "Dear _____: A Letter to a Lost Loved One" in the Adult Psychotherapy Homework Planner by [facility]); process the letter in session. Develop a grieving ritual with an identified feeling state (e.g., dress in dark colors, preferably black, to indicate deep sorrow) which the client may focus on near the anniversary of the loss. Process what he/she received from the ritual. Ask the client to read books on grief and loss (e.g., Getting to the Other Side of Grief: Overcoming the Loss of a Spouse by Zonnebelt-Smeenge and Dotty Gee; Good Grief by Rollin Clock; When Bad Things Happen to Good People by Birt Bulla; How Can It Be All Right When Everything Is All Wrong? by [facility]); process the content. Assist the client in identifying the stages of grief that he/she has experienced and which stage he/she is presently working through. Assist the client in identifying and expressing feelings connected with his/her loss. Ask the client to bring pictures or mementos connected with his/her loss to a session and talk about them (or assign "Creating a [employer]" in the Adult Psychotherapy Homework Planner by Beacher Bottoms). Assist the client in clarifying his/her identity and meaning in life by listing his/her strengths, positive traits and talents, potential ways to contribute to society, and areas of interest and ability that have not yet been developed (or assign "What's Good About Me and My Life?" from the Adult Psychotherapy Homework Planner by Beacher Bottoms). Develop an action plan with the client to increase activities that give meaning and expand his/her sense of identity at a time of transition in life phases (e.g., single to married, employed to homemaker, childless to parent, employed to retired); Public house manager; suggest the client read material on  transitioning in life (e.g., Managing Transitions: Making the Most of Change or Transitions: Making Sense of Life's Changes by [employer]). Explore opportunities for the client to overcome his/her sense of isolation (e.g., joining a community recreational or educational group, becoming active in church or synagogue activities, taking formal education classes, enrolling in an exercise group, joining a hobby support group); encourage implementation of these activities. Use role-playing and modeling to teach the client social skills needed to reach out to build new relationships (e.g., starting conversations, introducing self, asking questions of others about themselves, smiling and being friendly, inviting new acquaintances to his/her home, initiating a social engagement or activity with a new acquaintance). Suggest reading material to the client on making the transition that is stressful (e.g., new marriage, new parent, becoming full-time homemaker, providing care to an aging parent, retirement, or adjusting to an "empty nest"); consult the Bibliotherapy Appendix for selected titles. Use role-playing, modeling, and behavior rehearsal to teach the client assertiveness skills that can be applied to reducing conflict or dissatisfaction. Encourage the client to read books on assertiveness and boundary setting (e.g., The Assertiveness Workbook: How to Express Your Ideas and Stand Up for Yourself at Work and in Relationships by Efraim Grange; Asserting Yourself by Alferd Igo; When I Say No, I Feel Guilty by Felipe Horton; Your Perfect Right by Angelo Barge); process the content and its application to the client's daily life. Teach the client communication skills (e.g., "I messages," active listening, eye contact) to apply to his/her current  life stress factors. Engage the client in behavioral activation, increasing the client's contact with sources of reward, identifying processes that inhibit activation, and teaching  skills to solve life problems (or assign "Identify and Schedule Pleasant Activities" in the Adult Psychotherapy Homework Planner by Beacher Bottoms); use behavioral techniques such as instruction, rehearsal, role-playing, role reversal as needed to assist adoption into the client's daily life; reinforce success. Use techniques from Acceptance and Commitment Therapy to help client accept uncomfortable realities such as lack of complete control, imperfections, and uncertainty and tolerate unpleasant emotions and thoughts in order to accomplish value-consistent goals. Teach the client more about depression and how to recognize and accept some sadness as a normal variation in feeling. Use mindfulness meditation and cognitive therapy techniques to help the client learn to recognize and regulate the negative thought processes associated with depression and to change his/her relationship with these thoughts (see Mindfulness-Based Cognitive Therapy for Depression by Dorrene Gaucher, and Gilles Lacks). Help the client resolve depression related to interpersonal problems through the use of reassurance and support, clarification of cognitive and affective triggers that ignite conflicts, and active problem-solving (or assign "Applying Problem-Solving to Interpersonal Conflict" in the Adult Psychotherapy Homework Planner by Beacher Bottoms). For role transitions (e.g., beginning or ending a relationship or career, moving, promotion, retirement, graduation), help the client mourn the loss of the old role while recognizing positive and negative aspects of the new role, and taking steps to gain mastery over the new role. Assign the client to read about progressive muscle relaxation and other calming strategies in relevant books or treatment manuals (e.g., Progressive Relaxation Training by Rodolfo Clan and Arvil Birks; Mastery of Your Anxiety and Worry: Workbook by Rodney Clamp). Teach the client calming/relaxation skills (e.g., applied  relaxation, progressive muscle relaxation, cue-controlled relaxation; mindful breathing; biofeedback) and how to discriminate better between relaxation and tension; teach the client how to apply these skills to his/her daily life (e.g., New Directions in Progressive Muscle Relaxation by Fara Hone, and Hazlett-Stevens; Treating Generalized Anxiety Disorder by Rygh and Joya Nissen).  Diagnosis: Generalized Anxiety Disorder Major Depressive Disorder, Recurrent Episode  Plan:  -continue self-care -next appointment will be Tuesday, Jun 04, 2023 at 9am.

## 2023-05-16 ENCOUNTER — Encounter: Payer: Self-pay | Admitting: Family Medicine

## 2023-05-27 ENCOUNTER — Encounter: Payer: Self-pay | Admitting: Podiatry

## 2023-05-27 ENCOUNTER — Encounter: Payer: Self-pay | Admitting: Family Medicine

## 2023-05-27 MED ORDER — FLUTICASONE PROPIONATE 50 MCG/ACT NA SUSP: 2.0000 | Freq: Every day | NASAL | 0 refills | Status: DC

## 2023-05-27 NOTE — Telephone Encounter (Signed)
 Forwarding patient message to [facility] covering Dr. Dianah Fort.

## 2023-06-04 ENCOUNTER — Ambulatory Visit: Admitting: Professional

## 2023-06-13 ENCOUNTER — Encounter: Payer: Self-pay | Admitting: Podiatry

## 2023-06-13 ENCOUNTER — Ambulatory Visit: Admitting: Podiatry

## 2023-06-13 DIAGNOSIS — G5762 Lesion of plantar nerve, left lower limb: Secondary | ICD-10-CM | POA: Diagnosis not present

## 2023-06-13 DIAGNOSIS — G5761 Lesion of plantar nerve, right lower limb: Secondary | ICD-10-CM | POA: Diagnosis not present

## 2023-06-13 MED ORDER — TRIAMCINOLONE ACETONIDE 10 MG/ML IJ SUSP
2.5000 mg | Freq: Once | INTRAMUSCULAR | Status: AC
Start: 2023-06-13 — End: 2023-06-13
  Administered 2023-06-13: 2.5 mg via INTRA_ARTICULAR

## 2023-06-13 MED ORDER — DEXAMETHASONE SODIUM PHOSPHATE 120 MG/30ML IJ SOLN
4.0000 mg | Freq: Once | INTRAMUSCULAR | Status: AC
  Administered 2023-06-13: 4 mg via INTRA_ARTICULAR

## 2023-06-13 NOTE — Progress Notes (Signed)
  Subjective:  Patient ID: Kristen Nichols, female    DOB: [birth year],   MRN: [id number]  Chief Complaint  Patient presents with   Neuroma    "The right one hurts.  Now at night, I have the throbbing and it's painful to walk on.  I want her to check my left one now.  I have the same symptoms, just not as intense." N - pain L - interdigital 3-4 met left D - 1 week O - suddenly C - burning A - difficult to walk on at night T - none    68 y.o. female presents for  follow-up of right foot pain. Relates the injections for neuroma helped in the past and requesting another injection today. States the left foot now is acting up. This started about a week ago and causing burning pain and difficulty walking. Denies any current treatments on the left. Denies any other pedal complaints. Denies n/v/f/c.   Past Medical History:  Diagnosis Date   Allergy    Hayfever   Anxiety    Benign essential hypertension 10/13/2012   Last Assessment & Plan:  Well controlled.     Colitis    Diverticulosis    GERD (gastroesophageal reflux disease)    History of colon polyps    Hypertension    IBS (irritable bowel syndrome)    PONV (postoperative nausea and vomiting)    after ankle surgery in 1994   UC (ulcerative colitis) (HCC)     Objective:  Physical Exam: Vascular: DP/PT pulses 2/4 bilateral. CFT <3 seconds. Normal hair growth on digits. No edema.  Skin. No lacerations or abrasions bilateral feet.  Musculoskeletal: MMT 5/5 bilateral lower extremities in DF, PF, Inversion and Eversion. Deceased ROM in DF of ankle joint. Tender to third interspace on the right and pain along the third and fourth MPJs. Positive mulders click and pain with metatarsal squeeze. Tender to the left third interspace on exam. Mildly positive mulder's click noted and pain with metatarsal squeeze.  Neurological: Sensation intact to light touch.   Assessment:   1. Morton's neuroma of right foot   2. Morton's neuroma, left         Plan:  Patient was evaluated and treated and all questions answered. Discussed neuroma and treatment options with patient.  Radiographs reviewed and discussed with patient.  Injection offered today. Patient in agreement. Procedure below.  Discussed padding and offloading today. Discussed trying this for the left as well.   Discussed if pain does not improve may consider  MRI for further surgical planning.  Patient to return as needed  Procedure: Injection Tendon/Ligament Discussed alternatives, risks, complications and verbal consent was obtained.  Location: Right third interspace. Left third interspace.  Skin Prep: Alcohol . Injectate: 1cc 0.5% marcaine  plain, 1 cc dexamethasone  0.5 cc kenalog  each Disposition: Patient tolerated procedure well. Injection site dressed with a band-aid.  Post-injection care was discussed and return precautions discussed.      Jennefer Moats, DPM

## 2023-06-20 ENCOUNTER — Encounter: Admitting: Family Medicine

## 2023-06-20 ENCOUNTER — Other Ambulatory Visit: Payer: Self-pay | Admitting: Physician Assistant

## 2023-06-20 ENCOUNTER — Ambulatory Visit: Admitting: Podiatry

## 2023-06-25 ENCOUNTER — Other Ambulatory Visit (HOSPITAL_COMMUNITY)
Admission: RE | Admit: 2023-06-25 | Discharge: 2023-06-25 | Disposition: A | Discharge status: Home / Self Care | Source: Ambulatory Visit | Attending: Urgent Care | Admitting: Urgent Care

## 2023-06-25 ENCOUNTER — Encounter: Payer: Self-pay | Admitting: Urgent Care

## 2023-06-25 ENCOUNTER — Ambulatory Visit: Admitting: Urgent Care

## 2023-06-25 VITALS — BP 107/72 | HR 82 | Ht 62.5 in | Wt 122.0 lb

## 2023-06-25 DIAGNOSIS — Z124 Encounter for screening for malignant neoplasm of cervix: Secondary | ICD-10-CM | POA: Diagnosis not present

## 2023-06-25 DIAGNOSIS — Z1151 Encounter for screening for human papillomavirus (HPV): Secondary | ICD-10-CM | POA: Insufficient documentation

## 2023-06-25 DIAGNOSIS — E785 Hyperlipidemia, unspecified: Secondary | ICD-10-CM | POA: Diagnosis not present

## 2023-06-25 DIAGNOSIS — R7309 Other abnormal glucose: Secondary | ICD-10-CM

## 2023-06-25 DIAGNOSIS — Z23 Encounter for immunization: Secondary | ICD-10-CM

## 2023-06-25 DIAGNOSIS — Z79899 Other long term (current) drug therapy: Secondary | ICD-10-CM

## 2023-06-25 DIAGNOSIS — Z01419 Encounter for gynecological examination (general) (routine) without abnormal findings: Secondary | ICD-10-CM | POA: Diagnosis present

## 2023-06-25 DIAGNOSIS — Z0000 Encounter for general adult medical examination without abnormal findings: Secondary | ICD-10-CM | POA: Diagnosis not present

## 2023-06-25 NOTE — Progress Notes (Signed)
 Annual Wellness Visit     Patient: Kristen Nichols, Female    DOB: [birth year], 68 y.o.   MRN: [id number]  Subjective  Chief Complaint  Patient presents with   Annual Exam    W/pap    Kristen Nichols is a 68 y.o. female who presents today for her Annual Wellness Visit. She reports consuming a general diet. Exercises routinely primarily through horse riding. She generally feels well. She reports sleeping difficulties requiring Rx medication. She does not have additional problems to discuss today.   HPI  Vision:Within last year and Dental: No current dental problems and Receives regular dental care  Discussed the use of AI scribe software for clinical note transcription with the patient, who gave verbal consent to proceed.  History of Present Illness   Kristen Nichols is a 68 year old female who presents for annual PE.  She has concerns about 'crunchy' skin lesions on her face.  She has noticed 'crunchy' areas on her skin, which she describes as similar to scabs. Her concern about these changes increased after her husband had skin lesions removed. She does not have a dermatologist and has not undergone routine skin checks.  She is currently taking bupropion  150 mg once daily, Allegra-D, fluoxetine  40 mg, Flonase  as needed, hydrochlorothiazide  12.5 mg for blood pressure, and trazodone  25 mg nightly. Her blood pressure is well-controlled, typically around 115-116/70s, and she monitors it at home. She drinks a lot of water.  She underwent cataract surgery in March and is pleased with the results. She uses sustained eye drops post-surgery.  She reports waking up every night at 3 AM despite taking trazodone . She sometimes takes a whole pill if needed. She feels rested sometimes and falls asleep within an hour of taking trazodone  if she goes to bed immediately.  She engages in physical activity with horses, spending about two to three hours a week, usually in one-hour  sessions. 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  She has no dietary restrictions and is not vegetarian. She has a family history of diabetes on her maternal grandmother's side. No personal history of diabetes or high glucose levels.       Patient Active Problem List   Diagnosis Date Noted   Radiculitis of right cervical region 01/29/2023   Urinary frequency 10/02/2022   COVID-19 09/21/2022   Nasal pain 05/23/2022   Generalized anxiety disorder 10/05/2021   Anxiety 09/06/2021   Toe pain 09/06/2021   Hiatal hernia with GERD and esophagitis 05/01/2021   History of fundoplication    Gastroesophageal reflux disease 12/11/2018   Gastroesophageal reflux disease with esophagitis    Hiatal hernia    Gastroesophageal reflux disease with esophagitis without hemorrhage    Colitis 04/09/2016   Insomnia 02/23/2016   Dyslipidemia 12/29/2014   Carpal tunnel syndrome 12/29/2014   Allergic rhinitis 10/13/2012   HTN (hypertension) 10/13/2012   Depression, major, single episode, complete remission (HCC) 05/07/2010   Past Medical History:  Diagnosis Date   Allergy    Hayfever   Anxiety    Benign essential hypertension 10/13/2012   Last Assessment & Plan:  Well controlled.     Colitis    Diverticulosis    GERD (gastroesophageal reflux disease)    History of colon polyps    Hypertension    IBS (irritable bowel syndrome)    PONV (postoperative nausea and vomiting)    after ankle surgery in 1994   UC (ulcerative colitis) (  HCC)    Past Surgical History:  Procedure Laterality Date   ANKLE SURGERY Left    pin in place    BREAST BIOPSY Right 2017   marker in place    BREAST CYST ASPIRATION Right 2016   CESAREAN SECTION     x2   COLONOSCOPY     has had 2 or 3 of them per patient. Last one was around 2016 with [facility] or maybe [facility] , last february 2020 now toal of 4    ESOPHAGEAL MANOMETRY N/A 03/01/2021   Procedure: ESOPHAGEAL MANOMETRY (EM);  Surgeon:  Annis Kinder, DO;  Location: [facility];  Service: Gastroenterology;  Laterality: N/A;   ESOPHAGOGASTRODUODENOSCOPY N/A 05/01/2021   Procedure: ESOPHAGOGASTRODUODENOSCOPY (EGD);  Surgeon: Annis Kinder, DO;  Location: [facility];  Service: Gastroenterology;  Laterality: N/A;   ESOPHAGOGASTRODUODENOSCOPY (EGD) WITH PROPOFOL  N/A 12/11/2018   Procedure: ESOPHAGOGASTRODUODENOSCOPY (EGD) WITH PROPOFOL ;  Surgeon: Annis Kinder, DO;  Location: [facility];  Service: Gastroenterology;  Laterality: N/A;   HIATAL HERNIA REPAIR N/A 05/01/2021   Procedure: LAPAROSCOPIC REPAIR OF HIATAL HERNIA;  Surgeon: Aldean Hummingbird, MD;  Location: [facility];  Service: General;  Laterality: N/A;   MALONEY DILATION  12/11/2018   Procedure: Dorothyann Gather;  Surgeon: Annis Kinder, DO;  Location: [facility];  Service: Gastroenterology;;   NASAL SINUS SURGERY     SAVORY DILATION  05/01/2021   Procedure: SAVORY DILATION;  Surgeon: Annis Kinder, DO;  Location: [facility];  Service: Gastroenterology;;   TRANSORAL INCISIONLESS FUNDOPLICATION N/A 12/11/2018   Procedure: TRANSORAL INCISIONLESS FUNDOPLICATION;  Surgeon: Annis Kinder, DO;  Location: [facility];  Service: Gastroenterology;  Laterality: N/A;   TRANSORAL INCISIONLESS FUNDOPLICATION N/A 05/01/2021   Procedure: TRANSORAL INCISIONLESS FUNDOPLICATION;  Surgeon: Annis Kinder, DO;  Location: [facility];  Service: Gastroenterology;  Laterality: N/A;   Social History   Tobacco Use   Smoking status: Former    Current packs/day: 0.00    Average packs/day: 0.5 packs/day for 20.0 years (10.0 ttl pk-yrs)    Types: Cigarettes    Start date: 01/09/1996    Quit date: 01/09/2016    Years since quitting: 7.4   Smokeless tobacco: Never  Vaping Use   Vaping status: Former  Substance Use Topics   Alcohol  use: Yes    Alcohol /week: 2.0 standard drinks of alcohol     Types: 2 Cans of beer per week    Comment: occas   Drug use: No       Medications: Outpatient Medications Prior to Visit  Medication Sig   buPROPion  (WELLBUTRIN  XL) 150 MG 24 hr tablet TAKE 1 TABLET BY MOUTH EVERY DAY   fexofenadine-pseudoephedrine (ALLEGRA-D) 60-120 MG 12 hr tablet Take 1 tablet by mouth daily.   FLUoxetine  (PROZAC ) 40 MG capsule Take 1 capsule (40 mg total) by mouth daily.   fluticasone  (FLONASE ) 50 MCG/ACT nasal spray SPRAY 2 SPRAYS INTO EACH NOSTRIL EVERY DAY   hydrochlorothiazide  (HYDRODIURIL ) 12.5 MG tablet TAKE 1 TABLET BY MOUTH EVERY DAY   Propylene Glycol (SYSTANE BALANCE) 0.6 % SOLN Place 1 drop into both eyes 2 (two) times daily as needed (dry/irritated eyes).   traZODone  (DESYREL ) 50 MG tablet TAKE 0.5-1 TABLETS BY MOUTH AT BEDTIME AS NEEDED FOR SLEEP.   [DISCONTINUED] FLUoxetine  (PROZAC ) 20 MG tablet TAKE 1 TABLET BY MOUTH EVERY DAY   [DISCONTINUED] meloxicam  (MOBIC ) 15 MG tablet Take 1 tablet (15 mg total) by mouth daily. (Patient taking differently: Take 15 mg by mouth daily as needed.)   [  DISCONTINUED] polyethylene glycol (MIRALAX  / GLYCOLAX ) 17 g packet Take 17 g by mouth daily. (Patient taking differently: Take 17 g by mouth daily as needed.)   [DISCONTINUED] sucralfate  (CARAFATE ) 1 GM/10ML suspension Take 10 mLs (1 g total) by mouth 4 (four) times daily. (Patient taking differently: Take 1 g by mouth 4 (four) times daily as needed.)   [DISCONTINUED] chlorpheniramine-HYDROcodone  (TUSSIONEX) 10-8 MG/5ML Take 5 mLs by mouth every 12 (twelve) hours as needed for cough (cough, will cause drowsiness.).   [DISCONTINUED] predniSONE  (DELTASONE ) 50 MG tablet One tab PO daily for 5 days.   [DISCONTINUED] simethicone  (MYLICON) 80 MG chewable tablet Chew 1 tablet (80 mg total) by mouth every 6 (six) hours as needed for flatulence (gas, bloating, abdominal discomfort).   No facility-administered medications prior to visit.    No Known Allergies  Patient Care Team: Dianah Fort, Erika S, DO (Inactive) as PCP - General (Family  Medicine)  ROS Complete 12 point ROS performed with all pertinent positives listed in HPI      Objective  BP 107/72   Pulse 82   Ht 5' 2.5 (1.588 m)   Wt 122 lb (55.3 kg)   SpO2 98%   BMI 21.96 kg/m  BP Readings from Last 3 Encounters:  06/25/23 107/72  05/23/22 122/83  10/13/21 112/73   Wt Readings from Last 3 Encounters:  06/25/23 122 lb (55.3 kg)  10/02/22 122 lb 8 oz (55.6 kg)  05/23/22 122 lb 8 oz (55.6 kg)      Physical Exam Vitals and nursing note reviewed. Exam conducted with a chaperone present.  Constitutional:      General: She is not in acute distress.    Appearance: Normal appearance. She is not ill-appearing, toxic-appearing or diaphoretic.  HENT:     Head: Normocephalic and atraumatic.     Right Ear: Tympanic membrane, ear canal and external ear normal. There is no impacted cerumen.     Left Ear: Tympanic membrane, ear canal and external ear normal. There is no impacted cerumen.     Nose: Nose normal.     Mouth/Throat:     Mouth: Mucous membranes are moist.     Pharynx: Oropharynx is clear. No oropharyngeal exudate or posterior oropharyngeal erythema.   Eyes:     General: No scleral icterus.       Right eye: No discharge.        Left eye: No discharge.     Extraocular Movements: Extraocular movements intact.     Pupils: Pupils are equal, round, and reactive to light.   Neck:     Thyroid: No thyroid mass, thyromegaly or thyroid tenderness.   Cardiovascular:     Rate and Rhythm: Normal rate and regular rhythm.     Pulses: Normal pulses.     Heart sounds: No murmur heard. Pulmonary:     Effort: Pulmonary effort is normal. No respiratory distress.     Breath sounds: Normal breath sounds. No stridor. No wheezing or rhonchi.  Abdominal:     General: Abdomen is flat. Bowel sounds are normal. There is no distension.     Palpations: Abdomen is soft. There is no mass.     Tenderness: There is no abdominal tenderness. There is no guarding.   Genitourinary:    Pubic Area: No rash or pubic lice.      Labia:        Right: No rash, tenderness, lesion or injury.        Left: No rash, tenderness, lesion  or injury.      Urethra: No prolapse, urethral pain, urethral swelling or urethral lesion.     Vagina: Normal. No vaginal discharge or lesions.     Cervix: Normal. No cervical motion tenderness, discharge, friability or lesion.     Uterus: Normal.      Adnexa: Right adnexa normal and left adnexa normal.       Right: No mass, tenderness or fullness.         Left: No mass, tenderness or fullness.       Rectum: Normal.   Musculoskeletal:     Cervical back: Normal range of motion and neck supple. No rigidity or tenderness.     Right lower leg: No edema.     Left lower leg: No edema.  Lymphadenopathy:     Cervical: No cervical adenopathy.     Lower Body: No right inguinal adenopathy. No left inguinal adenopathy.   Skin:    General: Skin is warm and dry.     Coloration: Skin is not jaundiced.     Findings: No bruising, erythema or rash.   Neurological:     General: No focal deficit present.     Mental Status: She is alert and oriented to person, place, and time.     Sensory: No sensory deficit.     Motor: No weakness.   Psychiatric:        Mood and Affect: Mood normal.        Behavior: Behavior normal.       Most recent functional status assessment:     No data to display         Most recent fall risk assessment:    06/25/2023    8:07 AM  Fall Risk   Falls in the past year? 0  Number falls in past yr: 0  Injury with Fall? 0  Risk for fall due to : No Fall Risks  Follow up Falls evaluation completed    Most recent depression screenings:    06/25/2023    8:07 AM 11/13/2022    1:52 PM  PHQ 2/9 Scores  PHQ - 2 Score 0 6  PHQ- 9 Score  18   Most recent cognitive screening:     No data to display         Most recent Audit-C alcohol  use screening     No data to display         A score of 3 or  more in women, and 4 or more in men indicates increased risk for alcohol  abuse, EXCEPT if all of the points are from question 1   Vision/Hearing Screen: No results found.  Last CBC Lab Results  Component Value Date   WBC 7.2 04/12/2021   HGB 14.8 04/12/2021   HCT 42.9 04/12/2021   MCV 90.5 04/12/2021   MCH 31.2 04/12/2021   RDW 11.9 04/12/2021   PLT 320 04/12/2021   Last metabolic panel Lab Results  Component Value Date   GLUCOSE 84 04/12/2021   NA 139 04/12/2021   K 4.5 04/12/2021   CL 101 04/12/2021   CO2 31 04/12/2021   BUN 7 (L) 04/12/2021   CREATININE 0.78 04/12/2021   GFRNONAA >60 04/12/2021   CALCIUM 9.8 04/12/2021   PROT 7.3 04/12/2021   ALBUMIN 4.5 04/12/2021   BILITOT 0.4 04/12/2021   ALKPHOS 71 04/12/2021   AST 22 04/12/2021   ALT 20 04/12/2021   ANIONGAP 7 04/12/2021      No results  found for any visits on 06/25/23.    Assessment & Plan   Annual wellness visit done today including the all of the following: Reviewed patient's Family Medical History Reviewed and updated list of patient's medical providers Assessment of cognitive impairment was done Assessed patient's functional ability Established a written schedule for health screening services Health Risk Assessent Completed and Reviewed  Exercise Activities and Dietary recommendations  Goals   None     Immunization History  Administered Date(s) Administered   Influenza,inj,Quad PF,6+ Mos 02/23/2016, 02/05/2018, 09/11/2018, 03/01/2020   Moderna Sars-Covid-2 Vaccination 03/27/2019, 04/28/2019, 12/07/2019   PNEUMOCOCCAL CONJUGATE-20 06/25/2023   Td 01/09/2000   Tdap 02/05/2018   Zoster Recombinant(Shingrix) 03/01/2020, 05/17/2020    Health Maintenance  Topic Date Due   Medicare Annual Wellness (AWV)  Never done   COVID-19 Vaccine (4 - 2024-25 season) 09/09/2022   Cervical Cancer Screening (HPV/Pap Cotest)  02/06/2023   INFLUENZA VACCINE  08/09/2023   MAMMOGRAM  07/03/2024    DTaP/Tdap/Td (3 - Td or Tdap) 02/06/2028   Colonoscopy  02/27/2028   Pneumococcal Vaccine: 50+ Years  Completed   DEXA SCAN  Completed   Hepatitis C Screening  Completed   HIV Screening  Completed   Zoster Vaccines- Shingrix  Completed   HPV VACCINES  Aged Out   Meningococcal B Vaccine  Aged Out     Discussed health benefits of physical activity, and encouraged her to engage in regular exercise appropriate for her age and condition.    Problem List Items Addressed This Visit     Dyslipidemia   Relevant Orders   Lipid panel   Other Visit Diagnoses       Need for pneumococcal vaccine    -  Primary   Relevant Orders   Pneumococcal conjugate vaccine 20-valent (Completed)     Wellness examination       Relevant Orders   Comprehensive metabolic panel with GFR   CMP14+EGFR   Lipid panel   HgB A1c   CBC with Differential/Platelet     Cervical cancer screening       Relevant Orders   Cytology - PAP     Long-term use of high-risk medication       Relevant Orders   CMP14+EGFR   TSH   CBC with Differential/Platelet     Other abnormal glucose       Relevant Orders   Lipid panel   HgB A1c      Assessment and Plan    Hypertension Blood pressure low on current hydrochlorothiazide  dose. Discussed stress and pseudoephedrine effects on blood pressure. Advised temporary discontinuation of hydrochlorothiazide . - Discontinue hydrochlorothiazide  for one week and monitor blood pressure. - Restart hydrochlorothiazide  if blood pressure exceeds 140/90. - Monitor blood pressure regularly at home.  Skin concerns No concerning lesions identified at present. Advised monitoring for recurrence, especially on sun-exposed areas. - Monitor skin lesions for recurrence. - Refer to dermatology if lesions recur, especially on the face.  Insomnia Wakes at 3 AM despite trazodone . Discussed adding melatonin to improve sleep duration. - Consider adding melatonin to trazodone  regimen to improve  sleep duration.  General Health Maintenance Due for routine health maintenance. Agreed to update Pap smear and pneumococcal vaccine today. - Perform Pap smear. - Administer pneumococcal vaccine. - Schedule blood work.       No follow-ups on file.     Mandy Second, PA

## 2023-06-25 NOTE — Patient Instructions (Signed)
 We completed your annual physical today. Try to stop your hydrochlorothiazide  for one week and monitor BP at home. If it elevates >130/90, then restart hydrochlorothiazide .  We updated your pneumonia vaccine. Pap smear completed.   Please return annually, sooner as needed.

## 2023-06-26 ENCOUNTER — Ambulatory Visit: Payer: Self-pay | Admitting: Urgent Care

## 2023-06-26 DIAGNOSIS — B9689 Other specified bacterial agents as the cause of diseases classified elsewhere: Secondary | ICD-10-CM

## 2023-06-26 DIAGNOSIS — R7989 Other specified abnormal findings of blood chemistry: Secondary | ICD-10-CM

## 2023-06-26 DIAGNOSIS — R946 Abnormal results of thyroid function studies: Secondary | ICD-10-CM

## 2023-06-26 LAB — LIPID PANEL
Chol/HDL Ratio: 2.5 ratio (ref 0.0–4.4)
Cholesterol, Total: 179 mg/dL (ref 100–199)
HDL: 72 mg/dL
LDL Chol Calc (NIH): 89 mg/dL (ref 0–99)
Triglycerides: 100 mg/dL (ref 0–149)
VLDL Cholesterol Cal: 18 mg/dL (ref 5–40)

## 2023-06-26 LAB — CMP14+EGFR
ALT: 19 [IU]/L (ref 0–32)
AST: 21 [IU]/L (ref 0–40)
Albumin: 4.4 g/dL (ref 3.9–4.9)
Alkaline Phosphatase: 69 [IU]/L (ref 44–121)
BUN/Creatinine Ratio: 14 (ref 12–28)
BUN: 10 mg/dL (ref 8–27)
Bilirubin Total: 0.5 mg/dL (ref 0.0–1.2)
CO2: 24 mmol/L (ref 20–29)
Calcium: 9.3 mg/dL (ref 8.7–10.3)
Chloride: 100 mmol/L (ref 96–106)
Creatinine, Ser: 0.74 mg/dL (ref 0.57–1.00)
Globulin, Total: 1.8 g/dL (ref 1.5–4.5)
Glucose: 90 mg/dL (ref 70–99)
Potassium: 3.9 mmol/L (ref 3.5–5.2)
Sodium: 141 mmol/L (ref 134–144)
Total Protein: 6.2 g/dL (ref 6.0–8.5)
eGFR: 90 mL/min/{1.73_m2}

## 2023-06-26 LAB — CBC WITH DIFFERENTIAL/PLATELET
Basophils Absolute: 0 10*3/uL (ref 0.0–0.2)
Basos: 1 %
EOS (ABSOLUTE): 0.1 10*3/uL (ref 0.0–0.4)
Eos: 2 %
Hematocrit: 42 % (ref 34.0–46.6)
Hemoglobin: 13.8 g/dL (ref 11.1–15.9)
Immature Grans (Abs): 0 10*3/uL (ref 0.0–0.1)
Immature Granulocytes: 0 %
Lymphocytes Absolute: 1.4 10*3/uL (ref 0.7–3.1)
Lymphs: 24 %
MCH: 31.1 pg (ref 26.6–33.0)
MCHC: 32.9 g/dL (ref 31.5–35.7)
MCV: 95 fL (ref 79–97)
Monocytes Absolute: 0.6 10*3/uL (ref 0.1–0.9)
Monocytes: 10 %
Neutrophils Absolute: 3.7 10*3/uL (ref 1.4–7.0)
Neutrophils: 63 %
Platelets: 298 10*3/uL (ref 150–450)
RBC: 4.44 x10E6/uL (ref 3.77–5.28)
RDW: 11.9 % (ref 11.7–15.4)
WBC: 5.8 10*3/uL (ref 3.4–10.8)

## 2023-06-26 LAB — HEMOGLOBIN A1C
Est. average glucose Bld gHb Est-mCnc: 97 mg/dL
Hgb A1c MFr Bld: 5 % (ref 4.8–5.6)

## 2023-06-26 LAB — TSH: TSH: 0.411 u[IU]/mL — ABNORMAL LOW (ref 0.450–4.500)

## 2023-06-27 LAB — CYTOLOGY - PAP
Adequacy: ABSENT
Comment: NEGATIVE
Diagnosis: NEGATIVE
High risk HPV: NEGATIVE

## 2023-06-27 MED ORDER — METRONIDAZOLE 500 MG PO TABS: 500.0000 mg | ORAL_TABLET | Freq: Two times a day (BID) | ORAL | 0 refills | Status: AC

## 2023-07-02 ENCOUNTER — Encounter: Admitting: Urgent Care

## 2023-07-02 ENCOUNTER — Ambulatory Visit: Admitting: Professional

## 2023-07-05 ENCOUNTER — Encounter: Admitting: Urgent Care

## 2023-07-09 ENCOUNTER — Other Ambulatory Visit: Payer: Self-pay | Admitting: Urgent Care

## 2023-07-09 DIAGNOSIS — Z1231 Encounter for screening mammogram for malignant neoplasm of breast: Secondary | ICD-10-CM

## 2023-07-30 ENCOUNTER — Encounter: Payer: Self-pay | Admitting: Professional

## 2023-07-30 ENCOUNTER — Ambulatory Visit (INDEPENDENT_AMBULATORY_CARE_PROVIDER_SITE_OTHER): Admitting: Professional

## 2023-07-30 DIAGNOSIS — F411 Generalized anxiety disorder: Secondary | ICD-10-CM

## 2023-07-30 DIAGNOSIS — F339 Major depressive disorder, recurrent, unspecified: Secondary | ICD-10-CM

## 2023-07-30 DIAGNOSIS — F4321 Adjustment disorder with depressed mood: Secondary | ICD-10-CM

## 2023-07-30 NOTE — Progress Notes (Signed)
 [facility] Counselor/Therapist Progress Note  Patient ID: Kristen Nichols, MRN: [id number],    Date: 07/30/2023  Time Spent: 31 minutes 9-931am  Treatment Type: Individual Therapy  Risk Assessment: Danger to Self:  No Self-injurious Behavior: No Danger to Others: No  Subjective: This session was held via video teletherapy. The patient consented to video teletherapy and was located in her office during this session. She is aware it is the responsibility of the patient to secure confidentiality on her end of the session. The provider was in a private home office for the duration of this session.    The patient arrived on time for her Caregility appointment.  Issues addressed: 1-mood -feels encouraged 2-[name] -got a job and started yesterday -the center she lives took her to her interview and to her job the first day -she had an appointment on Friday with psychiatry and had a medication adjustment -the program is staying on top of helping her stay on task -she can stay where she is as long as she is paying her rent and participating in her programming the way she is expected -the program is teaching her step by step -she is riding the bus to work -pt going to visit this weekend after not seeing in four weeks -she has had a daily phone contact 3-personal -I am the nice person at home -she is keeping the peace between her husband and her 9 year old father   -the honeymoon is over -pt has been having a lot of realizations about how to move forward in life -pt is looking to be present in her father and husband's life -she is ready to increase time with her horse -she will be glad to free up her time to go with Adeline when her desires -she is beginning yoga on Saturday with her sister -[place] their [place] because they got an offer they could not refuse -the lake house would have required renovation and they were not up for it 4- son -his teenaged  daughter  Marylynn has moved in permanently and will be getting settled into school and sports -she was living with her mother and missed so much school that compromised her -relationship with son is great and they ae on vacation at this time 5-professional -she is starting to be a little shorter 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 Excessive and/or unrealistic worry that is difficult to control occurring more days than not for at least 6 months about a number of events or activities. Thoughts dominated by loss coupled with poor concentration, tearful spells, and confusion about the future. Serial losses in life (i.e., deaths, divorces, jobs) that led to depression and discouragement. Frustration and anxiety related to providing oversight and caretaking to an aging, ailing, and dependent parent. Restlessness and feelings of lost identity and meaning due to retirement. Depressed or irritable mood. Poor concentration and indecisiveness. Unresolved grief issues. History of chronic or recurrent depression for which the client has taken antidepressant medication, been hospitalized, had  outpatient treatment, or had a course of electroconvulsive therapy. Goals: Resolve the core conflict that is the source of anxiety. Appropriately grieve the loss in order to normalize mood and to return to previously adaptive level of functioning. Develop healthy thinking patterns and  beliefs about self, others, and the world that lead to the alleviation and help prevent the relapse of depression. Reorient life view to recognize the advantages of the current situation. Balance life activities between consideration of others and development of own interests. Resolve the loss, reengaging in old relationships and initiating new contacts with others. Begin a healthy grieving process around the loss. Learn and implement coping skills that result in a reduction of anxiety and worry, and improved daily functioning. Objectives (Target date for all objectives is 11/12/2023): Learn and implement calming skills to reduce overall anxiety and manage anxiety symptoms. Identify and engage in pleasant activities on a daily basis. Learn to accept limitations in life and commit to tolerating, rather than avoiding, unpleasant emotions while accomplishing meaningful goals. Report decreased time spent each day focusing on the loss. Express thoughts and feelings about the deceased that went unexpressed while the deceased was alive. Identify what stages of grief have been experienced in the continuum of the grieving process. Begin verbalizing feelings associated with the loss. Read books on the topic of grief to better understand the loss experience and to increase a sense of hope. Identify values that guide life's decisions and determine fulfillment. Implement increased assertiveness to take control of conflicts. Increase communication with significant others regarding current life stress factors. Increase activities that reinforce a positive self-identity. Read self-help book on the difficult transition life is presenting currently. Increase social contacts to reduce sense of isolation. Implement mindfulness techniques for relapse prevention. Verbalize an understanding and resolution of current interpersonal problems. Learn and implement conflict resolution skills to resolve interpersonal  problems. Increasingly verbalize hopeful and positive statements regarding self, others, and the future. Interventions: Conduct an empty-chair exercise with the client where he/she focuses on expressing to the lost loved one imagined in the chair what he/she never said while that loved one was alive. Ask the client to write a letter to the lost person describing his/her fond memories and/or painful and regretful memories, and how he/she currently feels life (or assign Dear _____: A Letter to a Lost Loved One in the Adult Psychotherapy Homework Planner by [facility]); process the letter in session. Develop a grieving ritual with an identified feeling state (e.g., dress in dark colors, preferably black, to indicate deep sorrow) which the client may focus on near the anniversary of the loss. Process what he/she received from the ritual. Ask the client to read books on grief and loss (e.g., Getting to the Other Side of Grief: Overcoming the Loss of a Spouse by Zonnebelt-Smeenge and Everitt Estimable; Good Grief by Berwyn; When Bad Things Happen to Good People by Ceil; How Can It Be All Right When Everything Is All Wrong? by [facility]); process the content. Assist the client in identifying the stages of grief that he/she has experienced and which stage he/she is presently working through. Assist the client in identifying and expressing feelings connected with his/her loss. Ask the client to bring pictures or mementos connected with his/her loss to a session and talk about them (or assign Creating a Patent examiner in the Adult Psychotherapy Administrator, arts by Jenniffer). Assist the client in clarifying his/her identity and meaning in life by listing his/her strengths, positive traits and talents, potential ways to  contribute to society, and areas of interest and ability that have not yet been developed (or assign What's Good About Me and My Life? from the Adult Psychotherapy Homework Planner by Jenniffer). Develop  an action plan with the client to increase activities that give meaning and expand his/her sense of identity at a time of transition in life phases (e.g., single to married, employed to homemaker, childless to parent, employed to retired); Public house manager; suggest the client read material on transitioning in life (e.g., Managing Transitions: Making the Most of Change or Transitions: Making Sense of Life's Changes by [employer]). Explore opportunities for the client to overcome his/her sense of isolation (e.g., joining a community recreational or educational group, becoming active in church or synagogue activities, taking formal education classes, enrolling in an exercise group, joining a hobby support group); encourage implementation of these activities. Use role-playing and modeling to teach the client social skills needed to reach out to build new relationships (e.g., starting conversations, introducing self, asking questions of others about themselves, smiling and being friendly, inviting new acquaintances to his/her home, initiating a social engagement or activity with a new acquaintance). Suggest reading material to the client on making the transition that is stressful (e.g., new marriage, new parent, becoming full-time homemaker, providing care to an aging parent, retirement, or adjusting to an empty nest); consult the Bibliotherapy Appendix for selected titles. Use role-playing, modeling, and behavior rehearsal to teach the client assertiveness skills that can be applied to reducing conflict or dissatisfaction. Encourage the client to read books on assertiveness and boundary setting (e.g., The Assertiveness Workbook: How to Express Your Ideas and Stand Up for Yourself at Work and in Relationships by Yolande; Asserting Yourself by Seward armin Seward; When I Say No, I Feel Guilty by Claudene; Your Perfect Right by Darrelyn armin Sick); process the content and its application to the client's daily  life. Teach the client communication skills (e.g., I messages, active listening, eye contact) to apply to his/her current life stress factors. Engage the client in behavioral activation, increasing the client's contact with sources of reward, identifying processes that inhibit activation, and teaching skills to solve life problems (or assign Identify and Schedule Pleasant Activities in the Adult Psychotherapy Homework Planner by Jenniffer); use behavioral techniques such as instruction, rehearsal, role-playing, role reversal as needed to assist adoption into the client's daily life; reinforce success. Use techniques from Acceptance and Commitment Therapy to help client accept uncomfortable realities such as lack of complete control, imperfections, and uncertainty and tolerate unpleasant emotions and thoughts in order to accomplish value-consistent goals. Teach the client more about depression and how to recognize and accept some sadness as a normal variation in feeling. Use mindfulness meditation and cognitive therapy techniques to help the client learn to recognize and regulate the negative thought processes associated with depression and to change his/her relationship with these thoughts (see Mindfulness-Based Cognitive Therapy for Depression by Kriste Pouch, and Jil). Help the client resolve depression related to interpersonal problems through the use of reassurance and support, clarification of cognitive and affective triggers that ignite conflicts, and active problem-solving (or assign Applying Problem-Solving to Interpersonal Conflict in the Adult Psychotherapy Homework Planner by Jenniffer). For role transitions (e.g., beginning or ending a relationship or career, moving, promotion, retirement, graduation), help the client mourn the loss of the old role while recognizing positive and negative aspects of the new role, and taking steps to gain mastery over the new role. Assign the client to  read about progressive muscle relaxation  and other calming strategies in relevant books or treatment manuals (e.g., Progressive Relaxation Training by Thornell armin Collier; Mastery of Your Anxiety and Worry: Workbook by Richarda armin Given). Teach the client calming/relaxation skills (e.g., applied relaxation, progressive muscle relaxation, cue-controlled relaxation; mindful breathing; biofeedback) and how to discriminate better between relaxation and tension; teach the client how to apply these skills to his/her daily life (e.g., New Directions in Progressive Muscle Relaxation by Thornell Collier, and Hazlett-Stevens; Treating Generalized Anxiety Disorder by Rygh and Red).  Diagnosis: Generalized Anxiety Disorder Major Depressive Disorder, Recurrent Episode  Plan:  -next appointment will be Tuesday, August 27, 2023 at 8am.

## 2023-07-31 ENCOUNTER — Ambulatory Visit

## 2023-08-01 ENCOUNTER — Ambulatory Visit

## 2023-08-17 LAB — TSH: TSH: 0.568 u[IU]/mL (ref 0.450–4.500)

## 2023-08-17 LAB — T4, FREE: Free T4: 1.14 ng/dL (ref 0.82–1.77)

## 2023-08-20 ENCOUNTER — Ambulatory Visit

## 2023-08-27 ENCOUNTER — Encounter: Payer: Self-pay | Admitting: Urgent Care

## 2023-08-27 ENCOUNTER — Encounter: Payer: Self-pay | Admitting: Professional

## 2023-08-27 ENCOUNTER — Ambulatory Visit (INDEPENDENT_AMBULATORY_CARE_PROVIDER_SITE_OTHER): Admitting: Professional

## 2023-08-27 DIAGNOSIS — F325 Major depressive disorder, single episode, in full remission: Secondary | ICD-10-CM

## 2023-08-27 DIAGNOSIS — F411 Generalized anxiety disorder: Secondary | ICD-10-CM | POA: Diagnosis not present

## 2023-08-27 DIAGNOSIS — F339 Major depressive disorder, recurrent, unspecified: Secondary | ICD-10-CM | POA: Diagnosis not present

## 2023-08-27 NOTE — Progress Notes (Signed)
 [facility] Counselor/Therapist Progress Note  Patient ID: Kristen Nichols, MRN: [id number],    Date: 08/27/2023  Time Spent: 46 minutes 8-846am  Treatment Type: Individual Therapy  Risk Assessment: Danger to Self:  No Self-injurious Behavior: No Danger to Others: No  Subjective: This session was held via video teletherapy. The patient consented to video teletherapy and was located in her office during this session. She is aware it is the responsibility of the patient to secure confidentiality on her end of the session. The provider was in a private home office for the duration of this session.    The patient arrived on time for her Caregility appointment.  Issues addressed: 1-mood -terrible, really not except related to [facility] -overwhelmed, tired, disappointed, feel hopeless -more short-tempered with everyone  08/27/2023    PHQ2-9 Depression Screening   Little interest or pleasure in doing things Nearly every day  Feeling down, depressed, or hopeless Nearly every day  PHQ-2 - Total Score 6  Trouble falling or staying asleep, or sleeping too much More than half the days  Feeling tired or having little energy Nearly every day  Poor appetite or overeating  Not at all  Feeling bad about yourself - or that you are a failure or have let yourself or your family down Nearly every day  Trouble concentrating on things, such as reading the newspaper or watching television Not at all  Moving or speaking so slowly that other people could have noticed.  Or the opposite - being so fidgety or restless that you have been moving around a lot more than usual More than half the days  Thoughts that you would be better off dead, or hurting yourself in some way Not at all  PHQ2-9 Total Score 16  If you checked off any problems, how difficult have these problems made it for you to do your work, take care of things at home, or get along with other people Somewhat difficult  Depression  Interventions/Treatment Counseling   2-Kristen Nichols -ran away from her home on Sunday and got a call from [place] -they know where she is, she is staying at friend Kristen Nichols's house -pt so angry with her but so sad -plans to take a break from contact 3-son -positive is it is her son's birthday and she has already talked 5-professional -she is starting to be a little shorter at work and that persona is now at home -has been doing for a month and does not see it as sustainable -she is short-nerved at work -pt foresees retiring at the end of the year 6-coping skills -call PCP and request medication adjustment -don't stuff your feelings -get moving, get up and move -break every hour for ten minutes -increase awareness of thoughts, feelings and behaviors -take a power nap -talk to Kristen Nichols daily -write down your feelings to get out -making the most of her relationships -give to others and do not remain too internal -read about BPD and self-sabotage -parent support groups  -thought stopping  Treatment Plan Problems: Anxiety, Depression, Grief / Loss Unresolved, Phase Of Life Problems Symptoms: Hypervigilance (e.g., feeling constantly on edge, experiencing concentration difficulties having trouble falling or staying asleep, exhibiting a general state of irritability). Excessive and/or unrealistic worry that is difficult to control occurring more days than not for at least 6 months about a number of events or activities. Thoughts dominated by loss coupled with poor concentration, tearful spells, and confusion about the future. Serial losses in life (i.e., deaths, divorces, jobs)  that led to depression and discouragement. Frustration and anxiety related to providing oversight and caretaking to an aging, ailing, and dependent parent. Restlessness and feelings of lost identity and meaning due to retirement. Depressed or irritable mood. Poor concentration and indecisiveness. Unresolved grief  issues. History of chronic or recurrent depression for which the client has taken antidepressant medication, been hospitalized, had outpatient treatment, or had a course of electroconvulsive therapy. Goals: Resolve the core conflict that is the source of anxiety. Appropriately grieve the loss in order to normalize mood and to return to previously adaptive level of functioning. Develop healthy thinking patterns and beliefs about self, others, and the world that lead to the alleviation and help prevent the relapse of depression. Reorient life view to recognize the advantages of the current situation. Balance life activities between consideration of others and development of own interests. Resolve the loss, reengaging in old relationships and initiating new contacts with others. Begin a healthy grieving process around the loss. Learn and implement coping skills that result in a reduction of anxiety and worry, and improved daily functioning. Objectives (Target date for all objectives is 11/12/2023): Learn and implement calming skills to reduce overall anxiety and manage anxiety symptoms. Identify and engage in pleasant activities on a daily basis. Learn to accept limitations in life and commit to tolerating, rather than avoiding, unpleasant emotions while accomplishing meaningful goals. Report decreased time spent each day focusing on the loss. Express thoughts and feelings about the deceased that went unexpressed while the deceased was alive. Identify what stages of grief have been experienced in the continuum of the grieving process. Begin verbalizing feelings associated with the loss. Read books on the topic of grief to better understand the loss experience and to increase a sense of hope. Identify values that guide life's decisions and determine fulfillment. Implement increased assertiveness to take control of conflicts. Increase communication with significant others regarding current life  stress factors. Increase activities that reinforce a positive self-identity. Read self-help book on the difficult transition life is presenting currently. Increase social contacts to reduce sense of isolation. Implement mindfulness techniques for relapse prevention. Verbalize an understanding and resolution of current interpersonal problems. Learn and implement conflict resolution skills to resolve interpersonal problems. Increasingly verbalize hopeful and positive statements regarding self, others, and the future. Interventions: Conduct an empty-chair exercise with the client where he/she focuses on expressing to the lost loved one imagined in the chair what he/she never said while that loved one was alive. Ask the client to write a letter to the lost person describing his/her fond memories and/or painful and regretful memories, and how he/she currently feels life (or assign Dear _____: A Letter to a Lost Loved One in the Adult Psychotherapy Homework Planner by [facility]); process the letter in session. Develop a grieving ritual with an identified feeling state (e.g., dress in dark colors, preferably black, to indicate deep sorrow) which the client may focus on near the anniversary of the loss. Process what he/she received from the ritual. Ask the client to read books on grief and loss (e.g., Getting to the Other Side of Grief: Overcoming the Loss of a Spouse by Zonnebelt-Smeenge and Everitt Estimable; Good Grief by Berwyn; When Bad Things Happen to Good People by Ceil; How Can It Be All Right When Everything Is All Wrong? by [facility]); process the content. Assist the client in identifying the stages of grief that he/she has experienced and which stage he/she is presently working through. Assist the client in identifying and  expressing feelings connected with his/her loss. Ask the client to bring pictures or mementos connected with his/her loss to a session and talk about them (or assign Creating a  Patent examiner in the Adult Psychotherapy Administrator, arts by Jenniffer). Assist the client in clarifying his/her identity and meaning in life by listing his/her strengths, positive traits and talents, potential ways to contribute to society, and areas of interest and ability that have not yet been developed (or assign What's Good About Me and My Life? from the Adult Psychotherapy Homework Planner by Jenniffer). Develop an action plan with the client to increase activities that give meaning and expand his/her sense of identity at a time of transition in life phases (e.g., single to married, employed to homemaker, childless to parent, employed to retired); Public house manager; suggest the client read material on transitioning in life (e.g., Managing Transitions: Making the Most of Change or Transitions: Making Sense of Life's Changes by [employer]). Explore opportunities for the client to overcome his/her sense of isolation (e.g., joining a community recreational or educational group, becoming active in church or synagogue activities, taking formal education classes, enrolling in an exercise group, joining a hobby support group); encourage implementation of these activities. Use role-playing and modeling to teach the client social skills needed to reach out to build new relationships (e.g., starting conversations, introducing self, asking questions of others about themselves, smiling and being friendly, inviting new acquaintances to his/her home, initiating a social engagement or activity with a new acquaintance). Suggest reading material to the client on making the transition that is stressful (e.g., new marriage, new parent, becoming full-time homemaker, providing care to an aging parent, retirement, or adjusting to an empty nest); consult the Bibliotherapy Appendix for selected titles. Use role-playing, modeling, and behavior rehearsal to teach the client assertiveness skills that can be applied to  reducing conflict or dissatisfaction. Encourage the client to read books on assertiveness and boundary setting (e.g., The Assertiveness Workbook: How to Express Your Ideas and Stand Up for Yourself at Work and in Relationships by Yolande; Asserting Yourself by Seward armin Seward; When I Say No, I Feel Guilty by Claudene; Your Perfect Right by Darrelyn armin Sick); process the content and its application to the client's daily life. Teach the client communication skills (e.g., I messages, active listening, eye contact) to apply to his/her current life stress factors. Engage the client in behavioral activation, increasing the client's contact with sources of reward, identifying processes that inhibit activation, and teaching skills to solve life problems (or assign Identify and Schedule Pleasant Activities in the Adult Psychotherapy Homework Planner by Jenniffer); use behavioral techniques such as instruction, rehearsal, role-playing, role reversal as needed to assist adoption into the client's daily life; reinforce success. Use techniques from Acceptance and Commitment Therapy to help client accept uncomfortable realities such as lack of complete control, imperfections, and uncertainty and tolerate unpleasant emotions and thoughts in order to accomplish value-consistent goals. Teach the client more about depression and how to recognize and accept some sadness as a normal variation in feeling. Use mindfulness meditation and cognitive therapy techniques to help the client learn to recognize and regulate the negative thought processes associated with depression and to change his/her relationship with these thoughts (see Mindfulness-Based Cognitive Therapy for Depression by Kriste Pouch, and Jil). Help the client resolve depression related to interpersonal problems through the use of reassurance and support, clarification of cognitive and affective triggers that ignite conflicts, and active problem-solving (or  assign Applying Problem-Solving to Interpersonal Conflict in  the Adult Psychotherapy Homework Planner by Jenniffer). For role transitions (e.g., beginning or ending a relationship or career, moving, promotion, retirement, graduation), help the client mourn the loss of the old role while recognizing positive and negative aspects of the new role, and taking steps to gain mastery over the new role. Assign the client to read about progressive muscle relaxation and other calming strategies in relevant books or treatment manuals (e.g., Progressive Relaxation Training by Thornell and Elmer; Mastery of Your Anxiety and Worry: Workbook by Richarda armin Given). Teach the client calming/relaxation skills (e.g., applied relaxation, progressive muscle relaxation, cue-controlled relaxation; mindful breathing; biofeedback) and how to discriminate better between relaxation and tension; teach the client how to apply these skills to his/her daily life (e.g., New Directions in Progressive Muscle Relaxation by Thornell Elmer, and Hazlett-Stevens; Treating Generalized Anxiety Disorder by Rygh and Red).  Diagnosis: Generalized Anxiety Disorder Major Depressive Disorder, Recurrent Episode  Plan:  -research family support groups -resume bi-weekly appointments -next appointment will be Wednesday, September 11, 2023 at 3pm.

## 2023-08-30 ENCOUNTER — Encounter: Payer: Self-pay | Admitting: Urgent Care

## 2023-08-30 ENCOUNTER — Telehealth (INDEPENDENT_AMBULATORY_CARE_PROVIDER_SITE_OTHER): Admitting: Urgent Care

## 2023-08-30 VITALS — Ht 62.5 in | Wt 119.0 lb

## 2023-08-30 DIAGNOSIS — F419 Anxiety disorder, unspecified: Secondary | ICD-10-CM

## 2023-08-30 DIAGNOSIS — F339 Major depressive disorder, recurrent, unspecified: Secondary | ICD-10-CM | POA: Diagnosis not present

## 2023-08-30 MED ORDER — FLUOXETINE HCL 60 MG PO TABS: 1.0000 | ORAL_TABLET | Freq: Every day | ORAL | 1 refills | Status: AC

## 2023-08-30 NOTE — Assessment & Plan Note (Signed)
 SABRA

## 2023-08-30 NOTE — Assessment & Plan Note (Addendum)
    Assessment and Plan    Depression Chronic depression with melancholy and anhedonia. Anxiety and stress controlled. No suicidal ideation. - Increase fluoxetine  to 60 mg daily. - Monitor symptoms and mood over three weeks. - Contact provider via MyChart or virtual visit in three weeks to report symptom improvement. - Consider increasing Wellbutrin  if no significant improvement after three weeks.

## 2023-08-30 NOTE — Progress Notes (Signed)
 Virtual Visit via Video Note  I connected with Kristen Nichols on 08/30/23 at  9:00 AM EDT by a video enabled telemedicine application and verified that I am speaking with the correct person using two identifiers.  Patient Location: Home Provider Location: Office/Clinic  I discussed the limitations, risks, security, and privacy concerns of performing an evaluation and management service by video and the availability of in person appointments. I also discussed with the patient that there may be a patient responsible charge related to this service. The patient expressed understanding and agreed to proceed.   Subjective  PCP: Lowella Benton LITTIE, PA  Chief Complaint  Patient presents with   Medical Management of Chronic Issues   Transitions Of Care    Pt would like to discuss maybe goes her Prozac  and Wellbutrin      HPI:   Discussed the use of AI scribe software for clinical note transcription with the patient, who gave verbal consent to proceed.  History of Present Illness   Kristen Nichols is a 68 year old female who presents with depressive symptoms.  She experiences feelings of melancholy and a lack of interest in her usual activities, preferring to stay home rather than engage in her favorite hobbies. She feels generally unhappy but has no issues with falling asleep and denies any thoughts of self-harm.  She is currently taking Wellbutrin  150 mg XR, fluoxetine  40 mg once daily, and trazodone  50 mg at night. These medications were initially prescribed for symptoms related to depression, although the exact symptoms at the time of prescription are unspecified. She continues to follow with a therapist, previously monthly, but now every other week. She is dealing with significant life stressors primarily regarding her daughter which have exacerbated her depression.   Regarding anxiety, her overall stress is well controlled, but she continues to experience unhappiness.  No  excessive sleep or difficulty falling asleep. No thoughts of self-harm. Pt is hoping to adjust her medications to address her depression.        ROS: Per HPI. All other pertinent systems are negative.   Current Outpatient Medications:    FLUoxetine  HCl 60 MG TABS, Take 1 tablet by mouth daily., Disp: 90 tablet, Rfl: 1   buPROPion  (WELLBUTRIN  XL) 150 MG 24 hr tablet, TAKE 1 TABLET BY MOUTH EVERY DAY, Disp: 90 tablet, Rfl: 3   fexofenadine-pseudoephedrine (ALLEGRA-D) 60-120 MG 12 hr tablet, Take 1 tablet by mouth daily., Disp: , Rfl:    fluticasone  (FLONASE ) 50 MCG/ACT nasal spray, SPRAY 2 SPRAYS INTO EACH NOSTRIL EVERY DAY, Disp: 48 mL, Rfl: 1   hydrochlorothiazide  (HYDRODIURIL ) 12.5 MG tablet, TAKE 1 TABLET BY MOUTH EVERY DAY, Disp: 90 tablet, Rfl: 1   Propylene Glycol (SYSTANE BALANCE) 0.6 % SOLN, Place 1 drop into both eyes 2 (two) times daily as needed (dry/irritated eyes)., Disp: , Rfl:    traZODone  (DESYREL ) 50 MG tablet, TAKE 0.5-1 TABLETS BY MOUTH AT BEDTIME AS NEEDED FOR SLEEP., Disp: 90 tablet, Rfl: 2    Objective  Vital signs not able to be obtained due to this being a virtual visit.  Physical Exam Vitals and nursing note reviewed.  Constitutional:      General: She is not in acute distress.    Appearance: Normal appearance. She is not ill-appearing, toxic-appearing or diaphoretic.  HENT:     Head: Normocephalic and atraumatic.  Cardiovascular:     Rate and Rhythm: Normal rate.  Pulmonary:     Effort: Pulmonary effort is normal.  No respiratory distress.  Skin:    General: Skin is warm and dry.     Coloration: Skin is not jaundiced.  Neurological:     Mental Status: She is alert and oriented to person, place, and time.  Psychiatric:        Mood and Affect: Mood normal.        Behavior: Behavior normal.      Assessment & Plan Anxiety    Assessment and Plan    Depression Chronic depression with melancholy and anhedonia. Anxiety and stress controlled. No  suicidal ideation. - Increase fluoxetine  to 60 mg daily. - Monitor symptoms and mood over three weeks. - Contact provider via MyChart or virtual visit in three weeks to report symptom improvement. - Consider increasing Wellbutrin  if no significant improvement after three weeks.       Depression, recurrent (HCC)      No follow-ups on file.   I discussed the assessment and treatment plan with the patient. The patient was provided an opportunity to ask questions, and all were answered. The patient agreed with the plan and demonstrated an understanding of the instructions.   The patient was advised to call back or seek an in-person evaluation if the symptoms worsen or if the condition fails to improve as anticipated.  The above assessment and management plan was discussed with the patient. The patient verbalized understanding of and has agreed to the management plan.   Benton LITTIE Gave, PA

## 2023-08-30 NOTE — Patient Instructions (Signed)
 Increase fluoxetine  to 60mg  daily. Continue wellbutrin  as currently prescribed. Monitor for symptom improvement in the next few weeks... We will consider wellbutrin  dosage increase if needed.  Other things to help with depression: - over the counter L-tryptophan. Taking 500mg  three times daily, or 1000mg  prior to bed. This medication can make you feel sleepy - additional over the counter stress relief medications --> Boiron - Stress calm are natural herbal tablets - essential oils, in particular lavender, can be helpful - practicing deep breathing. This helps more with stress and anxiety - caffeine and sugar are linked to depression. Drinking more water, increasing physical activity and eating a more anti-inflammatory diet (such as natural fruits and veggies) can help.   Follow up in 3-4 weeks

## 2023-09-02 ENCOUNTER — Encounter: Payer: Self-pay | Admitting: Urgent Care

## 2023-09-03 ENCOUNTER — Other Ambulatory Visit (HOSPITAL_COMMUNITY): Payer: Self-pay

## 2023-09-03 ENCOUNTER — Telehealth: Payer: Self-pay

## 2023-09-03 NOTE — Telephone Encounter (Signed)
 Attempted call to pharmacy - held for 7 minutes without response will call again at later time.

## 2023-09-03 NOTE — Telephone Encounter (Signed)
 I called pharmacy. Held for 12 minutes. Will try again later.

## 2023-09-03 NOTE — Telephone Encounter (Signed)
 Pharmacy Patient Advocate Encounter   Received notification from Onbase that prior authorization for FLUoxetine  HCl 60MG  tablets  is required/requested.   Insurance verification completed.   The patient is insured through [employer] .   Per test claim: PA required; PA submitted to above mentioned insurance via Latent Key/confirmation #/EOC [facility] Status is pending

## 2023-09-04 ENCOUNTER — Other Ambulatory Visit (HOSPITAL_COMMUNITY): Payer: Self-pay

## 2023-09-10 ENCOUNTER — Encounter: Payer: Self-pay | Admitting: Sports Medicine

## 2023-09-11 ENCOUNTER — Ambulatory Visit
Admission: RE | Admit: 2023-09-11 | Discharge: 2023-09-11 | Disposition: A | Discharge status: Home / Self Care | Source: Ambulatory Visit | Attending: Urgent Care | Admitting: Urgent Care

## 2023-09-11 ENCOUNTER — Ambulatory Visit (INDEPENDENT_AMBULATORY_CARE_PROVIDER_SITE_OTHER): Admitting: Professional

## 2023-09-11 ENCOUNTER — Encounter: Payer: Self-pay | Admitting: Professional

## 2023-09-11 DIAGNOSIS — Z1231 Encounter for screening mammogram for malignant neoplasm of breast: Secondary | ICD-10-CM

## 2023-09-11 DIAGNOSIS — F325 Major depressive disorder, single episode, in full remission: Secondary | ICD-10-CM

## 2023-09-11 DIAGNOSIS — F339 Major depressive disorder, recurrent, unspecified: Secondary | ICD-10-CM

## 2023-09-11 DIAGNOSIS — F411 Generalized anxiety disorder: Secondary | ICD-10-CM

## 2023-09-11 NOTE — Progress Notes (Signed)
 [facility] Counselor/Therapist Progress Note  Patient ID: Kristen Nichols, MRN: [id number],    Date: 09/11/2023  Time Spent: 49 minutes 3-349pm  Treatment Type: Individual Therapy  Risk Assessment: Danger to Self:  No Self-injurious Behavior: No Danger to Others: No  Subjective: This session was held via video teletherapy. The patient consented to video teletherapy and was located in her office during this session. She is aware it is the responsibility of the patient to secure confidentiality on her end of the session. The provider was in a private home office for the duration of this session.    The patient arrived on time for her Caregility appointment.  Issues addressed: 1-homework -research family support groups 2-mood -much better -she had to deal with a lot of stressors but is in a much better place in the past week -medication changes have been helpful and she is no longer snapping at husband or coworkers 3-[name] -she has been moved to the other [employer] -she has done everything she was to do since the issues arose -she has been applying for jobs and sending an email receipt to her mom -she is not paying her rent -she has a SS appt pt will transport her to -she spent about 4.5 hours at [facility] and got her [place] ID -she is back on her meds and she notices when she is on vs off 4-family of origin relationships -sister and father got into a disagreements regarding politics including [employer] -father was escalating and yelled at pt's husband Elnor and he yelled back at her father -the next day Elnor did not understand her father's mood -next day her father escalated and threatened to move back into his own place -he verbalized that he was angry about the political discussion and pt and husband agreed they were not politically focused -brother in law Medford is not doing well -first weekend free she and spouse will go to [place] to visit Medford and  Mitzie 5-home -moved [place] 1 to [place] 2 and she has changed everything feels and she is much happier -went to the barn and rode with friends for two hours -worked in yard and cleaned up [employer] -she has still not announced -since working from home she is not sure when she will retire  Treatment Plan Problems: Anxiety, Depression, Grief / Loss Unresolved, Phase Of Life Problems Symptoms: Hypervigilance (e.g., feeling constantly on edge, experiencing concentration difficulties having trouble falling or staying asleep, exhibiting a general state of irritability). Excessive and/or unrealistic worry that is difficult to control occurring more days than not for at least 6 months about a number of events or activities. Thoughts dominated by loss coupled with poor concentration, tearful spells, and confusion about the future. Serial losses in life (i.e., deaths, divorces, jobs) that led to depression and discouragement. Frustration and anxiety related to providing oversight and caretaking to an aging, ailing, and dependent parent. Restlessness and feelings of lost identity and meaning due to retirement. Depressed or irritable mood. Poor concentration and indecisiveness. Unresolved grief issues. History of chronic or recurrent depression for which the client has taken antidepressant medication, been hospitalized, had outpatient treatment, or had a course of electroconvulsive therapy. Goals: Resolve the core conflict that is the source of anxiety. Appropriately grieve the loss in order to normalize mood and to return to previously adaptive level of functioning. Develop healthy thinking patterns and beliefs about self, others, and the world that lead to the alleviation and help prevent the relapse of depression. Reorient  life view to recognize the advantages of the current situation. Balance life activities between consideration of others and development of own interests. Resolve the loss,  reengaging in old relationships and initiating new contacts with others. Begin a healthy grieving process around the loss. Learn and implement coping skills that result in a reduction of anxiety and worry, and improved daily functioning. Objectives (Target date for all objectives is 11/12/2023): Learn and implement calming skills to reduce overall anxiety and manage anxiety symptoms. Identify and engage in pleasant activities on a daily basis. Learn to accept limitations in life and commit to tolerating, rather than avoiding, unpleasant emotions while accomplishing meaningful goals. Report decreased time spent each day focusing on the loss. Express thoughts and feelings about the deceased that went unexpressed while the deceased was alive. Identify what stages of grief have been experienced in the continuum of the grieving process. Begin verbalizing feelings associated with the loss. Read books on the topic of grief to better understand the loss experience and to increase a sense of hope. Identify values that guide life's decisions and determine fulfillment. Implement increased assertiveness to take control of conflicts. Increase communication with significant others regarding current life stress factors. Increase activities that reinforce a positive self-identity. Read self-help book on the difficult transition life is presenting currently. Increase social contacts to reduce sense of isolation. Implement mindfulness techniques for relapse prevention. Verbalize an understanding and resolution of current interpersonal problems. Learn and implement conflict resolution skills to resolve interpersonal problems. Increasingly verbalize hopeful and positive statements regarding self, others, and the future. Interventions: Conduct an empty-chair exercise with the client where he/she focuses on expressing to the lost loved one imagined in the chair what he/she never said while that loved one was  alive. Ask the client to write a letter to the lost person describing his/her fond memories and/or painful and regretful memories, and how he/she currently feels life (or assign Dear _____: A Letter to a Lost Loved One in the Adult Psychotherapy Homework Planner by [facility]); process the letter in session. Develop a grieving ritual with an identified feeling state (e.g., dress in dark colors, preferably black, to indicate deep sorrow) which the client may focus on near the anniversary of the loss. Process what he/she received from the ritual. Ask the client to read books on grief and loss (e.g., Getting to the Other Side of Grief: Overcoming the Loss of a Spouse by Zonnebelt-Smeenge and Everitt Estimable; Good Grief by Berwyn; When Bad Things Happen to Good People by Ceil; How Can It Be All Right When Everything Is All Wrong? by [facility]); process the content. Assist the client in identifying the stages of grief that he/she has experienced and which stage he/she is presently working through. Assist the client in identifying and expressing feelings connected with his/her loss. Ask the client to bring pictures or mementos connected with his/her loss to a session and talk about them (or assign Creating a Patent examiner in the Adult Psychotherapy Administrator, arts by Jenniffer). Assist the client in clarifying his/her identity and meaning in life by listing his/her strengths, positive traits and talents, potential ways to contribute to society, and areas of interest and ability that have not yet been developed (or assign What's Good About Me and My Life? from the Adult Psychotherapy Homework Planner by Jenniffer). Develop an action plan with the client to increase activities that give meaning and expand his/her sense of identity at a time of transition in life phases (e.g., single to married,  employed to homemaker, childless to parent, employed to retired); Public house manager; suggest the client read material on  transitioning in life (e.g., Managing Transitions: Making the Most of Change or Transitions: Making Sense of Life's Changes by [employer]). Explore opportunities for the client to overcome his/her sense of isolation (e.g., joining a community recreational or educational group, becoming active in church or synagogue activities, taking formal education classes, enrolling in an exercise group, joining a hobby support group); encourage implementation of these activities. Use role-playing and modeling to teach the client social skills needed to reach out to build new relationships (e.g., starting conversations, introducing self, asking questions of others about themselves, smiling and being friendly, inviting new acquaintances to his/her home, initiating a social engagement or activity with a new acquaintance). Suggest reading material to the client on making the transition that is stressful (e.g., new marriage, new parent, becoming full-time homemaker, providing care to an aging parent, retirement, or adjusting to an empty nest); consult the Bibliotherapy Appendix for selected titles. Use role-playing, modeling, and behavior rehearsal to teach the client assertiveness skills that can be applied to reducing conflict or dissatisfaction. Encourage the client to read books on assertiveness and boundary setting (e.g., The Assertiveness Workbook: How to Express Your Ideas and Stand Up for Yourself at Work and in Relationships by Yolande; Asserting Yourself by Seward armin Seward; When I Say No, I Feel Guilty by Claudene; Your Perfect Right by Darrelyn armin Sick); process the content and its application to the client's daily life. Teach the client communication skills (e.g., I messages, active listening, eye contact) to apply to his/her current life stress factors. Engage the client in behavioral activation, increasing the client's contact with sources of reward, identifying processes that inhibit activation, and teaching  skills to solve life problems (or assign Identify and Schedule Pleasant Activities in the Adult Psychotherapy Homework Planner by Jenniffer); use behavioral techniques such as instruction, rehearsal, role-playing, role reversal as needed to assist adoption into the client's daily life; reinforce success. Use techniques from Acceptance and Commitment Therapy to help client accept uncomfortable realities such as lack of complete control, imperfections, and uncertainty and tolerate unpleasant emotions and thoughts in order to accomplish value-consistent goals. Teach the client more about depression and how to recognize and accept some sadness as a normal variation in feeling. Use mindfulness meditation and cognitive therapy techniques to help the client learn to recognize and regulate the negative thought processes associated with depression and to change his/her relationship with these thoughts (see Mindfulness-Based Cognitive Therapy for Depression by Kriste Pouch, and Jil). Help the client resolve depression related to interpersonal problems through the use of reassurance and support, clarification of cognitive and affective triggers that ignite conflicts, and active problem-solving (or assign Applying Problem-Solving to Interpersonal Conflict in the Adult Psychotherapy Homework Planner by Jenniffer). For role transitions (e.g., beginning or ending a relationship or career, moving, promotion, retirement, graduation), help the client mourn the loss of the old role while recognizing positive and negative aspects of the new role, and taking steps to gain mastery over the new role. Assign the client to read about progressive muscle relaxation and other calming strategies in relevant books or treatment manuals (e.g., Progressive Relaxation Training by Thornell and Elmer; Mastery of Your Anxiety and Worry: Workbook by Richarda armin Given). Teach the client calming/relaxation skills (e.g., applied  relaxation, progressive muscle relaxation, cue-controlled relaxation; mindful breathing; biofeedback) and how to discriminate better between relaxation and tension; teach the client how to apply these skills to  his/her daily life (e.g., New Directions in Progressive Muscle Relaxation by Thornell Collier, and Hazlett-Stevens; Treating Generalized Anxiety Disorder by Rygh and Red).  Diagnosis: Generalized Anxiety Disorder Major Depressive Disorder, Recurrent Episode  Plan:  -next appointment will be Tuesday, September 24, 2023 at 8am.

## 2023-09-13 ENCOUNTER — Other Ambulatory Visit (HOSPITAL_COMMUNITY): Payer: Self-pay

## 2023-09-13 NOTE — Telephone Encounter (Signed)
 Called the insurance to get an update and they had to redo it as a coverage determination prior auth. Decision can take up to 7 business days. Reference #[id number]

## 2023-09-13 NOTE — Telephone Encounter (Signed)
 Pharmacy Patient Advocate Encounter  Received notification from [facility] that Prior Authorization for FLUoxetine  HCl 60MG  tablets  has been APPROVED from 06/15/2023 to 09/12/2024. Ran test claim, Copay is $30.00/90 day supply. This test claim was processed through [facility]- copay amounts may vary at other pharmacies due to pharmacy/plan contracts, or as the patient moves through the different stages of their insurance plan.   PA #/Case ID/Reference #: [id number]

## 2023-09-18 ENCOUNTER — Other Ambulatory Visit (HOSPITAL_COMMUNITY): Payer: Self-pay

## 2023-09-19 ENCOUNTER — Other Ambulatory Visit: Payer: Self-pay

## 2023-09-19 ENCOUNTER — Ambulatory Visit: Payer: Self-pay

## 2023-09-19 ENCOUNTER — Ambulatory Visit
Admission: EM | Admit: 2023-09-19 | Discharge: 2023-09-19 | Disposition: A | Discharge status: Home / Self Care | Attending: Family Medicine | Admitting: Family Medicine

## 2023-09-19 DIAGNOSIS — N3001 Acute cystitis with hematuria: Secondary | ICD-10-CM | POA: Diagnosis present

## 2023-09-19 LAB — POCT URINALYSIS DIP (MANUAL ENTRY)
Bilirubin, UA: NEGATIVE
Glucose, UA: NEGATIVE mg/dL
Ketones, POC UA: NEGATIVE mg/dL
Nitrite, UA: NEGATIVE
Spec Grav, UA: 1.02 (ref 1.010–1.025)
Urobilinogen, UA: 1 U/dL
pH, UA: 7.5 (ref 5.0–8.0)

## 2023-09-19 MED ORDER — CEFDINIR 300 MG PO CAPS: 300.0000 mg | ORAL_CAPSULE | Freq: Two times a day (BID) | ORAL | 0 refills | Status: AC

## 2023-09-19 NOTE — ED Provider Notes (Signed)
 Kristen Nichols CARE    CSN: [id number] Arrival date & time: 09/19/23  1505      History   Chief Complaint No chief complaint on file.   HPI Kristen Nichols is a 68 y.o. female.   HPI  Past Medical History:  Diagnosis Date   Allergy    Hayfever   Anxiety    Benign essential hypertension 10/13/2012   Last Assessment & Plan:  Well controlled.     Colitis    Diverticulosis    GERD (gastroesophageal reflux disease)    History of colon polyps    Hypertension    IBS (irritable bowel syndrome)    PONV (postoperative nausea and vomiting)    after ankle surgery in 1994   [facility] (ulcerative [facility]) [facility])     Patient Active Problem List   Diagnosis Date Noted   Depression, recurrent (HCC) 08/30/2023   Radiculitis of right cervical region 01/29/2023   Urinary frequency 10/02/2022   COVID-19 09/21/2022   Nasal pain 05/23/2022   Generalized anxiety disorder 10/05/2021   Anxiety 09/06/2021   Toe pain 09/06/2021   Hiatal hernia with GERD and esophagitis 05/01/2021   History of fundoplication    Gastroesophageal reflux disease 12/11/2018   Gastroesophageal reflux disease with esophagitis    Hiatal hernia    Gastroesophageal reflux disease with esophagitis without hemorrhage    Colitis 04/09/2016   Insomnia 02/23/2016   Dyslipidemia 12/29/2014   Carpal tunnel syndrome 12/29/2014   Allergic rhinitis 10/13/2012   HTN (hypertension) 10/13/2012   Depression, major, single episode, complete remission (HCC) 05/07/2010    Past Surgical History:  Procedure Laterality Date   ANKLE SURGERY Left    pin in place    BREAST BIOPSY Right 2017   marker in place    BREAST CYST ASPIRATION Right 2016   CESAREAN SECTION     x2   COLONOSCOPY     has had 2 or 3 of them per patient. Last one was around 2016 with [facility] or maybe [facility] , last february 2020 now toal of 4    ESOPHAGEAL MANOMETRY N/A 03/01/2021   Procedure: ESOPHAGEAL MANOMETRY (EM);  Surgeon: San Sandor GAILS,  DO;  Location: [facility];  Service: Gastroenterology;  Laterality: N/A;   ESOPHAGOGASTRODUODENOSCOPY N/A 05/01/2021   Procedure: ESOPHAGOGASTRODUODENOSCOPY (EGD);  Surgeon: San Sandor GAILS, DO;  Location: [facility];  Service: Gastroenterology;  Laterality: N/A;   ESOPHAGOGASTRODUODENOSCOPY (EGD) WITH PROPOFOL  N/A 12/11/2018   Procedure: ESOPHAGOGASTRODUODENOSCOPY (EGD) WITH PROPOFOL ;  Surgeon: San Sandor GAILS, DO;  Location: [facility];  Service: Gastroenterology;  Laterality: N/A;   HIATAL HERNIA REPAIR N/A 05/01/2021   Procedure: LAPAROSCOPIC REPAIR OF HIATAL HERNIA;  Surgeon: Tanda Locus, MD;  Location: [facility];  Service: General;  Laterality: N/A;   MALONEY DILATION  12/11/2018   Procedure: AGAPITO HODGKIN;  Surgeon: San Sandor GAILS, DO;  Location: [facility];  Service: Gastroenterology;;   NASAL SINUS SURGERY     SAVORY DILATION  05/01/2021   Procedure: SAVORY DILATION;  Surgeon: San Sandor GAILS, DO;  Location: [facility];  Service: Gastroenterology;;   TRANSORAL INCISIONLESS FUNDOPLICATION N/A 12/11/2018   Procedure: TRANSORAL INCISIONLESS FUNDOPLICATION;  Surgeon: San Sandor GAILS, DO;  Location: [facility];  Service: Gastroenterology;  Laterality: N/A;   TRANSORAL INCISIONLESS FUNDOPLICATION N/A 05/01/2021   Procedure: TRANSORAL INCISIONLESS FUNDOPLICATION;  Surgeon: San Sandor GAILS, DO;  Location: [facility];  Service: Gastroenterology;  Laterality: N/A;    OB History   No obstetric history on file.  Home Medications    Prior to Admission medications   Medication Sig Start Date End Date Taking? Authorizing Provider  cefdinir  (OMNICEF ) 300 MG capsule Take 1 capsule (300 mg total) by mouth 2 (two) times daily for 7 days. 09/19/23 09/26/23 Yes Teddy Sharper, FNP  buPROPion  (WELLBUTRIN  XL) 150 MG 24 hr tablet TAKE 1 TABLET BY MOUTH EVERY DAY 11/08/22   Bevin Asal S, DO  fexofenadine-pseudoephedrine (ALLEGRA-D) 60-120 MG 12 hr tablet Take 1 tablet by mouth daily.     [provider]  FLUoxetine  HCl 60 MG TABS Take 1 tablet by mouth daily. 08/30/23   Crain, Whitney L, PA  fluticasone  (FLONASE ) 50 MCG/ACT nasal spray SPRAY 2 SPRAYS INTO EACH NOSTRIL EVERY DAY 06/21/23   Breeback, Jade L, PA-C  hydrochlorothiazide  (HYDRODIURIL ) 12.5 MG tablet TAKE 1 TABLET BY MOUTH EVERY DAY 04/03/23   Bevin Asal RAMAN, DO  Propylene Glycol (SYSTANE BALANCE) 0.6 % SOLN Place 1 drop into both eyes 2 (two) times daily as needed (dry/irritated eyes).    [provider]  traZODone  (DESYREL ) 50 MG tablet TAKE 0.5-1 TABLETS BY MOUTH AT BEDTIME AS NEEDED FOR SLEEP. 12/10/22   Bevin Asal RAMAN, DO    Family History Family History  Problem Relation Age of Onset   Other Mother    Other Father    Lung cancer Father        small cell lung cancer-limited   Diabetes Maternal Grandmother    Colon cancer Neg Hx    Esophageal cancer Neg Hx    Breast cancer Neg Hx    BRCA 1/2 Neg Hx     Social History Social History   Tobacco Use   Smoking status: Former    Current packs/day: 0.00    Average packs/day: 0.5 packs/day for 20.0 years (10.0 ttl pk-yrs)    Types: Cigarettes    Start date: 01/09/1996    Quit date: 01/09/2016    Years since quitting: 7.6   Smokeless tobacco: Never  Vaping Use   Vaping status: Former  Substance Use Topics   Alcohol  use: Yes    Alcohol /week: 2.0 standard drinks of alcohol     Types: 2 Cans of beer per week    Comment: occas   Drug use: No     Allergies   Patient has no known allergies.   Review of Systems Review of Systems  Genitourinary:  Positive for dysuria and frequency.     Physical Exam Triage Vital Signs ED Triage Vitals  Encounter Vitals Group     BP      Girls Systolic BP Percentile      Girls Diastolic BP Percentile      Boys Systolic BP Percentile      Boys Diastolic BP Percentile      Pulse      Resp      Temp      Temp src      SpO2      Weight      Height      Head Circumference      Peak Flow       Pain Score      Pain Loc      Pain Education      Exclude from Growth Chart    No data found.  Updated Vital Signs BP 134/84   Pulse 84   Temp 98.2 F (36.8 C)   Resp 16   Wt 121 lb (54.9 kg)   SpO2 97%  BMI 21.78 kg/m   Visual Acuity Right Eye Distance:   Left Eye Distance:   Bilateral Distance:    Right Eye Near:   Left Eye Near:    Bilateral Near:     Physical Exam Vitals and nursing note reviewed.  Constitutional:      Appearance: Normal appearance. She is normal weight. She is not ill-appearing.  HENT:     Head: Normocephalic and atraumatic.     Mouth/Throat:     Mouth: Mucous membranes are moist.     Pharynx: Oropharynx is clear.  Eyes:     Extraocular Movements: Extraocular movements intact.     Pupils: Pupils are equal, round, and reactive to light.  Cardiovascular:     Rate and Rhythm: Normal rate and regular rhythm.     Pulses: Normal pulses.     Heart sounds: Normal heart sounds.  Pulmonary:     Effort: Pulmonary effort is normal.     Breath sounds: Normal breath sounds. No wheezing, rhonchi or rales.  Musculoskeletal:        General: Normal range of motion.  Skin:    General: Skin is warm and dry.  Neurological:     General: No focal deficit present.     Mental Status: She is alert and oriented to person, place, and time. Mental status is at baseline.  Psychiatric:        Mood and Affect: Mood normal.        Behavior: Behavior normal.        Thought Content: Thought content normal.      UC Treatments / Results  Labs (all labs ordered are listed, but only abnormal results are displayed) Labs Reviewed  POCT URINALYSIS DIP (MANUAL ENTRY) - Abnormal; Notable for the following components:      Result Value   Clarity, UA cloudy (*)    Blood, UA moderate (*)    Protein Ur, POC trace (*)    Leukocytes, UA Moderate (2+) (*)    All other components within normal limits  URINE CULTURE    EKG   Radiology No results  found.  Procedures Procedures (including critical care time)  Medications Ordered in UC Medications - No data to display  Initial Impression / Assessment and Plan / UC Course  I have reviewed the triage vital signs and the nursing notes.  Pertinent labs & imaging results that were available during my care of the patient were reviewed by me and considered in my medical decision making (see chart for details).     MDM: 1.  Acute cystitis with hematuria-UA revealed above, urine culture ordered, Rx'd cefdinir  300 mg capsule: Take 1 capsule twice daily x 7 days. Advised patient take medication as directed with food to completion.  Encouraged to increase daily water intake to 64 ounces per day while taking this medication.  Advised we will follow-up with urine culture results once received.  Advised if symptoms worsen and/or unresolved please follow-up with your PCP or here for further evaluation.  Patient discharged home, hemodynamically stable. Final Clinical Impressions(s) / UC Diagnoses   Final diagnoses:  Acute cystitis with hematuria   Discharge Instructions   None    ED Prescriptions     Medication Sig Dispense Auth. Provider   cefdinir  (OMNICEF ) 300 MG capsule Take 1 capsule (300 mg total) by mouth 2 (two) times daily for 7 days. 14 capsule Erhard Senske, FNP      PDMP not reviewed this encounter.   Joseeduardo Brix,  FNP 09/19/23 1532

## 2023-09-19 NOTE — Discharge Instructions (Addendum)
 Advised patient take medication as directed with food to completion.  Encouraged to increase daily water intake to 64 ounces per day while taking this medication.  Advised we will follow-up with urine culture results once received.  Advised if symptoms worsen and/or unresolved please follow-up with your PCP or here for further evaluation.

## 2023-09-19 NOTE — Telephone Encounter (Signed)
  FYI Only or Action Required?: FYI only for provider.  Patient was last seen in primary care on 08/30/2023 by Lowella Benton CROME, PA.  Called Nurse Triage reporting urinary symptoms.  Symptoms began yesterday.  Interventions attempted: Rest, hydration, or home remedies.  Symptoms are: rapidly worsening.  Triage Disposition: See Physician Within 24 Hours patient to [facility]  Patient/caregiver understands and will follow disposition?: Yes Copied from CRM #[id number]. Topic: Clinical - Red Word Triage >> Sep 19, 2023  2:40 PM Brittney F wrote: [facility] that prompted transfer to Nurse Triage:   Concern: Possible UTI   Symptoms: burning sensation in vaginal area (non stop); feeling weak   When did the symptoms start?: last night   What have you done to aid in the concern ? Have you taken anything to assist with the matter?: Yes    If so, what did you take?:Tylenol  ; 80 ounces of water Reason for Disposition  Urinating more frequently than usual (i.e., frequency) OR new-onset of the feeling of an urgent need to urinate (i.e., urgency)  Answer Assessment - Initial Assessment Questions 1. SYMPTOM: What's the main symptom you're concerned about? (e.g., frequency, incontinence)     Burning, frequency 2. ONSET: When did the  symptoms  start?     One day ago 3. PAIN: Is there any pain? If Yes, ask: How bad is it? (Scale: 1-10; mild, moderate, severe)     9/10 4. CAUSE: What do you think is causing the symptoms?     Possible  5. OTHER SYMPTOMS: Do you have any other symptoms? (e.g., blood in urine, fever, flank pain, pain with urination)     Lower abdominal pain 6. PREGNANCY: Is there any chance you are pregnant? When was your last menstrual period?     N/a  Protocols used: Urinary Symptoms-A-AH

## 2023-09-19 NOTE — Telephone Encounter (Signed)
 Patient seen today at urgent care.

## 2023-09-19 NOTE — ED Triage Notes (Addendum)
 Since last night has had painful urination. Has burning sensation at rest as well. No fever. Took tylenol , drank a lot of water. Has nausea as well.

## 2023-09-20 ENCOUNTER — Telehealth: Payer: Self-pay

## 2023-09-20 NOTE — Telephone Encounter (Signed)
 Pt states she feels much improved since UC visit. Advised to call if any questions or concerns.

## 2023-09-21 LAB — URINE CULTURE: Culture: >=100000 — AB

## 2023-09-23 ENCOUNTER — Ambulatory Visit: Payer: Self-pay

## 2023-09-24 ENCOUNTER — Ambulatory Visit: Admitting: Professional

## 2023-10-02 ENCOUNTER — Encounter: Payer: Self-pay | Admitting: Physician Assistant

## 2023-10-02 ENCOUNTER — Ambulatory Visit: Admitting: Physician Assistant

## 2023-10-02 VITALS — BP 136/82 | HR 87 | Temp 98.3°F | Ht 66.0 in | Wt 123.0 lb

## 2023-10-02 DIAGNOSIS — B379 Candidiasis, unspecified: Secondary | ICD-10-CM

## 2023-10-02 DIAGNOSIS — Z87440 Personal history of urinary (tract) infections: Secondary | ICD-10-CM | POA: Diagnosis not present

## 2023-10-02 DIAGNOSIS — R399 Unspecified symptoms and signs involving the genitourinary system: Secondary | ICD-10-CM | POA: Insufficient documentation

## 2023-10-02 DIAGNOSIS — T3695XA Adverse effect of unspecified systemic antibiotic, initial encounter: Secondary | ICD-10-CM

## 2023-10-02 LAB — POCT URINALYSIS DIP (CLINITEK)
Bilirubin, UA: NEGATIVE
Blood, UA: NEGATIVE
Glucose, UA: NEGATIVE mg/dL
Ketones, POC UA: NEGATIVE mg/dL
Leukocytes, UA: NEGATIVE
Nitrite, UA: NEGATIVE
POC PROTEIN,UA: NEGATIVE
Spec Grav, UA: 1.015 (ref 1.010–1.025)
Urobilinogen, UA: 0.2 U/dL
pH, UA: 7.5 (ref 5.0–8.0)

## 2023-10-02 MED ORDER — NITROFURANTOIN MONOHYD MACRO 100 MG PO CAPS: 100.0000 mg | ORAL_CAPSULE | Freq: Two times a day (BID) | ORAL | 0 refills | Status: AC

## 2023-10-02 MED ORDER — FLUCONAZOLE 150 MG PO TABS: 150.0000 mg | ORAL_TABLET | Freq: Every day | ORAL | 1 refills | Status: AC

## 2023-10-02 NOTE — Patient Instructions (Signed)

## 2023-10-02 NOTE — Progress Notes (Signed)
 Acute Office Visit  Subjective:     Patient ID: Kristen Nichols, female    DOB: Aug 04, 1955, 68 y.o.   MRN: [id number]  Chief Complaint  Patient presents with   Dysuria    HPI Patient is in today for recurrent UTI symptoms including dysuria, urgency, frequency, abdominal pain, and flank pain. She had the same symptoms 2 weeks ago when was diagnosed with acute cystitis. She took the full course of Cefdinir  and started to feel her symptoms subside. However, the symptoms returned 4 days ago on Sunday. Positive for nausea, loose stools; negative for vomiting, gross hematuria or history of kidney stones. She has not taken anything over the last few days to feel better. No fever, chills, body aches.  ROS  See HPI.     Objective:    BP 136/82 (BP Location: Left Arm, Patient Position: Sitting)   Pulse 87   Temp 98.3 F (36.8 C) (Temporal)   Ht 5' 6 (1.676 m)   Wt 123 lb (55.8 kg)   SpO2 99%   BMI 19.85 kg/m  BP Readings from Last 3 Encounters:  10/02/23 136/82  09/19/23 134/84  06/25/23 107/72   Wt Readings from Last 3 Encounters:  10/02/23 123 lb (55.8 kg)  09/19/23 121 lb (54.9 kg)  08/30/23 119 lb (54 kg)      Physical Exam Constitutional:      Appearance: Normal appearance.  HENT:     Head: Normocephalic.  Cardiovascular:     Rate and Rhythm: Normal rate and regular rhythm.  Pulmonary:     Effort: Pulmonary effort is normal.     Breath sounds: Normal breath sounds.  Abdominal:     General: There is no distension.     Palpations: Abdomen is soft.     Tenderness: There is abdominal tenderness. There is right CVA tenderness. There is no left CVA tenderness, guarding or rebound.     Comments: Suprapubic tenderness to palpation.   Neurological:     Mental Status: She is alert and oriented to person, place, and time.  Psychiatric:        Mood and Affect: Mood normal.     Results for orders placed or performed in visit on 10/02/23  POCT URINALYSIS DIP  (CLINITEK)  Result Value Ref Range   Color, UA colorless (A) yellow   Clarity, UA clear clear   Glucose, UA negative negative mg/dL   Bilirubin, UA negative negative   Ketones, POC UA negative negative mg/dL   Spec Grav, UA 8.984 8.989 - 1.025   Blood, UA negative negative   pH, UA 7.5 5.0 - 8.0   POC PROTEIN,UA negative negative, trace   Urobilinogen, UA 0.2 0.2 or 1.0 E.U./dL   Nitrite, UA Negative Negative   Leukocytes, UA Negative Negative        Assessment & Plan:  SABRASABRABeatrice Nichols was seen today for dysuria.  Diagnoses and all orders for this visit:  UTI symptoms -     POCT URINALYSIS DIP (CLINITEK) -     Urine Culture -     nitrofurantoin , macrocrystal-monohydrate, (MACROBID ) 100 MG capsule; Take 1 capsule (100 mg total) by mouth 2 (two) times daily.  History of UTI -     nitrofurantoin , macrocrystal-monohydrate, (MACROBID ) 100 MG capsule; Take 1 capsule (100 mg total) by mouth 2 (two) times daily.  Antibiotic-induced yeast infection -     fluconazole  (DIFLUCAN ) 150 MG tablet; Take 1 tablet (150 mg total) by mouth daily.   UA  negative for blood, leuks, nitrites, protein Will culture Will treat empirically today due to recent hx of UTI and unknown if cefdinir  would actually be as effective at treating Review past culture and choose macrobid  to start Hx of abx induced yeast infection and diflucan  sent to pharmacy to treat Increase hydration Follow up if symptoms worsening, persistent, changing.   Ayiden Milliman, PA-C

## 2023-10-05 LAB — URINE CULTURE: Organism ID, Bacteria: NO GROWTH

## 2023-10-06 ENCOUNTER — Ambulatory Visit: Payer: Self-pay | Admitting: Physician Assistant

## 2023-10-06 NOTE — Progress Notes (Signed)
 No growth on urine culture. Stop antibiotic. Your symptoms are not due to infection. It certainly could be more inflammation. How are you feeling today?

## 2023-10-08 ENCOUNTER — Ambulatory Visit: Admitting: Professional

## 2023-10-22 ENCOUNTER — Ambulatory Visit: Admitting: Professional

## 2023-11-05 ENCOUNTER — Ambulatory Visit: Admitting: Professional

## 2023-11-05 ENCOUNTER — Encounter: Payer: Self-pay | Admitting: Professional

## 2023-11-05 DIAGNOSIS — F339 Major depressive disorder, recurrent, unspecified: Secondary | ICD-10-CM

## 2023-11-05 DIAGNOSIS — F411 Generalized anxiety disorder: Secondary | ICD-10-CM | POA: Diagnosis not present

## 2023-11-05 DIAGNOSIS — F325 Major depressive disorder, single episode, in full remission: Secondary | ICD-10-CM

## 2023-11-05 NOTE — Progress Notes (Signed)
 [facility] Counselor/Therapist Progress Note  Patient ID: SELENIA MIHOK, MRN: [id number],    Date: 11/05/2023  Time Spent: 46 minutes 803-849am  Treatment Type: Individual Therapy  Risk Assessment: Danger to Self:  No Self-injurious Behavior: No Danger to Others: No  Subjective: This session was held via video teletherapy. The patient consented to video teletherapy and was located in her office during this session. She is aware it is the responsibility of the patient to secure confidentiality on her end of the session. The provider was in a private home office for the duration of this session.    The patient arrived on time for her Caregility appointment.  Issues addressed: 1-mood -doing okay -I feel like I'm doing what I'm supposed to be doing, and what I want to be doing 2-dog [name] died 5 wks ago tomorrow -pt becomes tearful when discussing loss -she wears his paw print on a necklace -pt still tearful 3-BIL [place] -on in-home palliative care -he can't speak but he was very alert -pt and [name] spent the weekend with her sister and BIL -life expectancy is 3-4 weeks -many close family and college friends 3-[name] -she had another tatum episode -she did not have a job -she was kicked out of the program because she wasn't paying hte bills -she moved in with her friend Charmaine -she had a come to [employer] with her -she put a solid plan in place with expectations  -pt told her daughter she was not even mad that she already knew she didn't have a job -she has a job and the pt has seen the offer letter and her start date -she will be training about a mile up the street with [employer] -pt appears indifferent -her spouse shared with her he is proud of her darlys Bills and DIL [name] -in good place with him -grandchildren 87 year old Sophia is doing great and has adjusted well to move from [facility] and she is in counseling -Sophia starts club volleyball in  November -Caden is 3 and is precious, he's just a sweet little boy, so smart, and plays soccer -Levern is 1 and she's a piece of work she is the boss and runs the house 5-dad -father has his scans every six months -father's MD generally calls to let him know things are good and he did not call him -pt supported him and was scared for him but she did not freak out 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 -she has applied for benefits -employer asked her if she would work additional 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 Excessive and/or unrealistic worry that is difficult to control occurring more days than not for at least 6 months about a number of events or activities. Thoughts dominated by loss coupled with poor concentration, tearful spells, and confusion about the future. Serial losses in life (i.e., deaths, divorces, jobs) that led to depression and discouragement. Frustration and anxiety related to providing oversight and caretaking to an aging, ailing, and dependent  parent. Restlessness and feelings of lost identity and meaning due to retirement. Depressed or irritable mood. Poor concentration and indecisiveness. Unresolved grief issues. History of chronic or recurrent depression for which the client has taken antidepressant medication, been hospitalized, had outpatient treatment, or had a course of electroconvulsive  therapy. Goals: Resolve the core conflict that is the source of anxiety. Appropriately grieve the loss in order to normalize mood and to return to previously adaptive level of functioning. Develop healthy thinking patterns and beliefs about self, others, and the world that lead to the alleviation and help prevent the relapse of depression. Reorient life view to recognize the advantages of the current situation. Balance life activities between consideration of others and development of own interests. Resolve the loss, reengaging in old relationships and initiating new contacts with others. Begin a healthy grieving process around the loss. Learn and implement coping skills that result in a reduction of anxiety and worry, and improved daily functioning. Objectives (Target date for all objectives is 11/12/2023): Learn and implement calming skills to reduce overall anxiety and manage anxiety symptoms. Identify and engage in pleasant activities on a daily basis. Learn to accept limitations in life and commit to tolerating, rather than avoiding, unpleasant emotions while accomplishing meaningful goals. Report decreased time spent each day focusing on the loss. Express thoughts and feelings about the deceased that went unexpressed while the deceased was alive. Identify what stages of grief have been experienced in the continuum of the grieving process. Begin verbalizing feelings associated with the loss. Read books on the topic of grief to better understand the loss experience and to increase a sense of hope. Identify values that guide life's decisions and determine fulfillment. Implement increased assertiveness to take control of conflicts. Increase communication with significant others regarding current life stress factors. Increase activities that reinforce a positive self-identity. Read self-help book on the difficult transition life is presenting currently. Increase social contacts to reduce  sense of isolation. Implement mindfulness techniques for relapse prevention. Verbalize an understanding and resolution of current interpersonal problems. Learn and implement conflict resolution skills to resolve interpersonal problems. Increasingly verbalize hopeful and positive statements regarding self, others, and the future. Interventions: Conduct an empty-chair exercise with the client where he/she focuses on expressing to the lost loved one imagined in the chair what he/she never said while that loved one was alive. Ask the client to write a letter to the lost person describing his/her fond memories and/or painful and regretful memories, and how he/she currently feels life (or assign Dear _____: A Letter to a Lost Loved One in the Adult Psychotherapy Homework Planner by [facility]); process the letter in session. Develop a grieving ritual with an identified feeling state (e.g., dress in dark colors, preferably black, to indicate deep sorrow) which the client may focus on near the anniversary of the loss. Process what he/she received from the ritual. Ask the client to read books on grief and loss (e.g., Getting to the Other Side of Grief: Overcoming the Loss of a Spouse by Zonnebelt-Smeenge and Everitt Estimable; Good Grief by Berwyn; When Bad Things Happen to Good People by Ceil; How Can It Be All Right When Everything Is All Wrong? by [facility]); process the content. Assist the client in identifying the stages of grief that he/she has experienced and which stage he/she is presently working through. Assist the client in identifying and expressing feelings connected with his/her loss. Ask the client to bring pictures or mementos connected with his/her loss to a session  and talk about them (or assign Creating a Patent Examiner in the Adult Psychotherapy Administrator, Arts by Jenniffer). Assist the client in clarifying his/her identity and meaning in life by listing his/her strengths, positive traits and  talents, potential ways to contribute to society, and areas of interest and ability that have not yet been developed (or assign What's Good About Me and My Life? from the Adult Psychotherapy Homework Planner by Jenniffer). Develop an action plan with the client to increase activities that give meaning and expand his/her sense of identity at a time of transition in life phases (e.g., single to married, employed to homemaker, childless to parent, employed to retired); public house manager; suggest the client read material on transitioning in life (e.g., Managing Transitions: Making the Most of Change or Transitions: Making Sense of Life's Changes by [employer]). Explore opportunities for the client to overcome his/her sense of isolation (e.g., joining a community recreational or educational group, becoming active in church or synagogue activities, taking formal education classes, enrolling in an exercise group, joining a hobby support group); encourage implementation of these activities. Use role-playing and modeling to teach the client social skills needed to reach out to build new relationships (e.g., starting conversations, introducing self, asking questions of others about themselves, smiling and being friendly, inviting new acquaintances to his/her home, initiating a social engagement or activity with a new acquaintance). Suggest reading material to the client on making the transition that is stressful (e.g., new marriage, new parent, becoming full-time homemaker, providing care to an aging parent, retirement, or adjusting to an empty nest); consult the Bibliotherapy Appendix for selected titles. Use role-playing, modeling, and behavior rehearsal to teach the client assertiveness skills that can be applied to reducing conflict or dissatisfaction. Encourage the client to read books on assertiveness and boundary setting (e.g., The Assertiveness Workbook: How to Express Your Ideas and Stand Up for Yourself at  Work and in Relationships by Yolande; Asserting Yourself by Seward armin Seward; When I Say No, I Feel Guilty by Claudene; Your Perfect Right by Darrelyn armin Sick); process the content and its application to the client's daily life. Teach the client communication skills (e.g., I messages, active listening, eye contact) to apply to his/her current life stress factors. Engage the client in behavioral activation, increasing the client's contact with sources of reward, identifying processes that inhibit activation, and teaching skills to solve life problems (or assign Identify and Schedule Pleasant Activities in the Adult Psychotherapy Homework Planner by Jenniffer); use behavioral techniques such as instruction, rehearsal, role-playing, role reversal as needed to assist adoption into the client's daily life; reinforce success. Use techniques from Acceptance and Commitment Therapy to help client accept uncomfortable realities such as lack of complete control, imperfections, and uncertainty and tolerate unpleasant emotions and thoughts in order to accomplish value-consistent goals. Teach the client more about depression and how to recognize and accept some sadness as a normal variation in feeling. Use mindfulness meditation and cognitive therapy techniques to help the client learn to recognize and regulate the negative thought processes associated with depression and to change his/her relationship with these thoughts (see Mindfulness-Based Cognitive Therapy for Depression by Kriste Pouch, and Jil). Help the client resolve depression related to interpersonal problems through the use of reassurance and support, clarification of cognitive and affective triggers that ignite conflicts, and active problem-solving (or assign Applying Problem-Solving to Interpersonal Conflict in the Adult Psychotherapy Homework Planner by Jenniffer). For role transitions (e.g., beginning or ending a relationship or career, moving,  promotion, retirement,  graduation), help the client mourn the loss of the old role while recognizing positive and negative aspects of the new role, and taking steps to gain mastery over the new role. Assign the client to read about progressive muscle relaxation and other calming strategies in relevant books or treatment manuals (e.g., Progressive Relaxation Training by Thornell and Elmer; Mastery of Your Anxiety and Worry: Workbook by Richarda armin Given). Teach the client calming/relaxation skills (e.g., applied relaxation, progressive muscle relaxation, cue-controlled relaxation; mindful breathing; biofeedback) and how to discriminate better between relaxation and tension; teach the client how to apply these skills to his/her daily life (e.g., New Directions in Progressive Muscle Relaxation by Thornell Elmer, and Hazlett-Stevens; Treating Generalized Anxiety Disorder by Rygh and Red).  Diagnosis: Generalized Anxiety Disorder Major Depressive Disorder, Recurrent Episode  Plan:  -write yearly treatment plan at next visit -move to monthly appointments -next appointment will be Tuesday, December 03, 2023 at 8am.

## 2023-11-19 ENCOUNTER — Ambulatory Visit: Admitting: Professional

## 2023-11-25 ENCOUNTER — Other Ambulatory Visit: Payer: Self-pay | Admitting: Urgent Care

## 2023-11-25 ENCOUNTER — Other Ambulatory Visit: Payer: Self-pay

## 2023-11-25 DIAGNOSIS — F419 Anxiety disorder, unspecified: Secondary | ICD-10-CM

## 2023-11-25 MED ORDER — BUPROPION HCL ER (XL) 150 MG PO TB24: 150.0000 mg | ORAL_TABLET | Freq: Every day | ORAL | 1 refills | Status: AC

## 2023-11-25 NOTE — Progress Notes (Signed)
 [facility] sent Rx request for fluoxetine  40mg . A 60mg  capsule was called in on 08/30/23 with a 6 month supply. Will deny 40mg  RX due to new script already at pharmacy.

## 2023-12-03 ENCOUNTER — Ambulatory Visit: Admitting: Professional

## 2023-12-10 ENCOUNTER — Encounter: Payer: Self-pay | Admitting: Podiatry

## 2023-12-11 ENCOUNTER — Ambulatory Visit: Payer: Self-pay | Admitting: Podiatry

## 2023-12-11 ENCOUNTER — Encounter: Payer: Self-pay | Admitting: Podiatry

## 2023-12-11 DIAGNOSIS — G5762 Lesion of plantar nerve, left lower limb: Secondary | ICD-10-CM

## 2023-12-11 DIAGNOSIS — G5761 Lesion of plantar nerve, right lower limb: Secondary | ICD-10-CM

## 2023-12-11 MED ORDER — TRIAMCINOLONE ACETONIDE 10 MG/ML IJ SUSP: 2.5000 mg | Freq: Once | INTRAMUSCULAR | Status: AC

## 2023-12-11 MED ORDER — DEXAMETHASONE SODIUM PHOSPHATE 120 MG/30ML IJ SOLN: 4.0000 mg | Freq: Once | INTRAMUSCULAR | Status: AC

## 2023-12-11 NOTE — Progress Notes (Signed)
  Subjective:  Patient ID: Kristen Nichols, female    DOB: May 03, 1955,   MRN: [id number]  Chief Complaint  Patient presents with   Neuroma    It's been six months since I had the injections.  I woke up on Sunday, this right foot was mad.  It aches the worst.  I'm getting ready to go to [place].    68 y.o. female presents for  follow-up of right foot pain. Relates the injections for neuroma helped in the past and requesting another injection today Relates right worse than left today but worried about both. Has a trip to [place] coming up. Denies any other pedal complaints. Denies n/v/f/c.   Past Medical History:  Diagnosis Date   Allergy    Hayfever   Anxiety    Benign essential hypertension 10/13/2012   Last Assessment & Plan:  Well controlled.     Colitis    Diverticulosis    GERD (gastroesophageal reflux disease)    History of colon polyps    Hypertension    IBS (irritable bowel syndrome)    PONV (postoperative nausea and vomiting)    after ankle surgery in 1994   UC (ulcerative colitis) (HCC)     Objective:  Physical Exam: Vascular: DP/PT pulses 2/4 bilateral. CFT <3 seconds. Normal hair growth on digits. No edema.  Skin. No lacerations or abrasions bilateral feet.  Musculoskeletal: MMT 5/5 bilateral lower extremities in DF, PF, Inversion and Eversion. Deceased ROM in DF of ankle joint. Tender to third interspace on the right and pain along the third and fourth MPJs. Positive mulders click and pain with metatarsal squeeze. Tender to the left third interspace on exam. Mildly positive mulder's click noted and pain with metatarsal squeeze.  Neurological: Sensation intact to light touch.   Assessment:   1. Morton's neuroma of right foot   2. Morton's neuroma, left         Plan:  Patient was evaluated and treated and all questions answered. Discussed neuroma and treatment options with patient.  Radiographs reviewed and discussed with patient.  Injection offered  today. Patient in agreement. Procedure below.  Discussed padding and offloading today. Discussed trying this for the left as well.   Discussed if pain does not improve may consider  MRI for further surgical planning.  Patient to return as needed  Procedure: Injection Tendon/Ligament Discussed alternatives, risks, complications and verbal consent was obtained.  Location: Right third interspace. Left third interspace.  Skin Prep: Alcohol . Injectate: 1cc 0.5% marcaine  plain, 1 cc dexamethasone  0.5 cc kenalog  each Disposition: Patient tolerated procedure well. Injection site dressed with a band-aid.  Post-injection care was discussed and return precautions discussed.      Asberry Failing, DPM

## 2023-12-17 ENCOUNTER — Ambulatory Visit: Payer: Self-pay | Admitting: Professional

## 2023-12-27 ENCOUNTER — Other Ambulatory Visit: Payer: Self-pay | Admitting: Physician Assistant

## 2024-01-14 ENCOUNTER — Ambulatory Visit: Admitting: Professional

## 2024-02-11 ENCOUNTER — Ambulatory Visit: Admitting: Professional
